# Patient Record
Sex: Female | Born: 1990 | Race: Black or African American | Hispanic: No | Marital: Single | Smoking: Never smoker
Health system: Southern US, Community
[De-identification: ages and names within clinical notes are randomized; demographics above are authoritative.]

## PROBLEM LIST (undated history)

## (undated) ENCOUNTER — Inpatient Hospital Stay (HOSPITAL_COMMUNITY): Payer: Self-pay

## (undated) ENCOUNTER — Ambulatory Visit: Admission: EM | Source: Home / Self Care

## (undated) ENCOUNTER — Ambulatory Visit

## (undated) DIAGNOSIS — R87629 Unspecified abnormal cytological findings in specimens from vagina: Secondary | ICD-10-CM

## (undated) DIAGNOSIS — O139 Gestational [pregnancy-induced] hypertension without significant proteinuria, unspecified trimester: Secondary | ICD-10-CM

## (undated) DIAGNOSIS — Z789 Other specified health status: Secondary | ICD-10-CM

## (undated) DIAGNOSIS — Z8619 Personal history of other infectious and parasitic diseases: Secondary | ICD-10-CM

## (undated) HISTORY — DX: Gestational (pregnancy-induced) hypertension without significant proteinuria, unspecified trimester: O13.9

## (undated) HISTORY — PX: WISDOM TOOTH EXTRACTION: SHX21

## (undated) HISTORY — PX: BARTHOLIN GLAND CYST EXCISION: SHX565

## (undated) HISTORY — DX: Unspecified abnormal cytological findings in specimens from vagina: R87.629

## (undated) HISTORY — PX: NO PAST SURGERIES: SHX2092

---

## 2012-03-05 ENCOUNTER — Emergency Department (INDEPENDENT_AMBULATORY_CARE_PROVIDER_SITE_OTHER)
Admission: EM | Admit: 2012-03-05 | Discharge: 2012-03-05 | Disposition: A | Discharge status: Home / Self Care | Payer: Self-pay | Source: Home / Self Care | Attending: Family Medicine | Admitting: Family Medicine

## 2012-03-05 ENCOUNTER — Encounter (HOSPITAL_COMMUNITY): Payer: Self-pay | Admitting: *Deleted

## 2012-03-05 DIAGNOSIS — L259 Unspecified contact dermatitis, unspecified cause: Secondary | ICD-10-CM

## 2012-03-05 HISTORY — DX: Personal history of other infectious and parasitic diseases: Z86.19

## 2012-03-05 MED ORDER — MOMETASONE FUROATE 0.1 % EX CREA: TOPICAL_CREAM | Freq: Every day | CUTANEOUS | Status: DC

## 2012-03-05 NOTE — ED Provider Notes (Signed)
History     CSN: [id number]  Arrival date & time 03/05/12  1330   First MD Initiated Contact with Patient 03/05/12 1353      Chief Complaint  Patient presents with  . Rash  . Eye Problem    (Consider location/radiation/quality/duration/timing/severity/associated sxs/prior treatment) Patient is a 21 y.o. female presenting with rash and eye problem. The history is provided by the patient.  Rash  This is a new problem. The current episode started more than 2 days ago. The problem has been gradually worsening. The problem is associated with an unknown (pollen exposure outdoors with work) factor. There has been no fever. The rash is present on the face and neck. The patient is experiencing no pain. Associated symptoms include itching.  Eye Problem  Associated symptoms include itching.    Past Medical History  Diagnosis Date  . History of cold sores     History reviewed. No pertinent past surgical history.  History reviewed. No pertinent family history.  History  Substance Use Topics  . Smoking status: Never Smoker   . Smokeless tobacco: Not on file  . Alcohol Use: No    OB History    Grav Para Term Preterm Abortions TAB SAB Ect Mult Living                  Review of Systems  Constitutional: Negative.   Skin: Positive for itching and rash.    Allergies  Review of patient's allergies indicates no known allergies.  Home Medications   Current Outpatient Rx  Name Route Sig Dispense Refill  . CALAMINE EX LOTN Topical Apply topically as needed.    Marland Kitchen DIPHENHYDRAMINE HCL (SLEEP) 25 MG PO TABS Oral Take 50 mg by mouth at bedtime as needed.    . MOMETASONE FUROATE 0.1 % EX CREA Topical Apply topically daily. 45 g 0    BP 118/73  Pulse 71  Temp(Src) 98.6 F (37 C) (Oral)  Resp 17  SpO2 100%  LMP 01/30/2012  Physical Exam  Nursing note and vitals reviewed. Constitutional: She is oriented to person, place, and time. She appears well-developed and well-nourished.    HENT:  Head: Normocephalic.  Right Ear: External ear normal.  Left Ear: External ear normal.  Mouth/Throat: Oropharynx is clear and moist.  Eyes: Conjunctivae and EOM are normal. Pupils are equal, round, and reactive to light.  Neck: Normal range of motion. Neck supple.  Pulmonary/Chest: Breath sounds normal.  Neurological: She is alert and oriented to person, place, and time.  Skin: Skin is warm and dry. Rash noted.       Dry whitish patchy pruritic rash to bilat lower eyelids and cheeks and ant neck  Psychiatric: She has a normal mood and affect.    ED Course  Procedures (including critical care time)  Labs Reviewed - No data to display No results found.   1. Contact dermatitis and eczema       MDM         Linna Hoff, MD 03/05/12 1540

## 2012-03-05 NOTE — ED Notes (Signed)
Pt with onset of rash under left eye x 2 days - now with swelling and rash bilateral face - iching

## 2012-08-09 ENCOUNTER — Encounter (HOSPITAL_COMMUNITY): Payer: Self-pay | Admitting: Emergency Medicine

## 2012-08-09 ENCOUNTER — Emergency Department (HOSPITAL_COMMUNITY)
Admission: EM | Admit: 2012-08-09 | Discharge: 2012-08-09 | Disposition: A | Discharge status: Home / Self Care | Payer: Medicaid Other | Attending: Emergency Medicine | Admitting: Emergency Medicine

## 2012-08-09 DIAGNOSIS — R51 Headache: Secondary | ICD-10-CM | POA: Insufficient documentation

## 2012-08-09 DIAGNOSIS — N12 Tubulo-interstitial nephritis, not specified as acute or chronic: Secondary | ICD-10-CM | POA: Insufficient documentation

## 2012-08-09 LAB — CBC WITH DIFFERENTIAL/PLATELET
Basophils Absolute: 0 10*3/uL (ref 0.0–0.1)
Basophils Relative: 0 % (ref 0–1)
Hemoglobin: 13.9 g/dL (ref 12.0–15.0)
Lymphocytes Relative: 8 % — ABNORMAL LOW (ref 12–46)
MCHC: 34.7 g/dL (ref 30.0–36.0)
Monocytes Relative: 6 % (ref 3–12)
Neutro Abs: 11.8 10*3/uL — ABNORMAL HIGH (ref 1.7–7.7)
Neutrophils Relative %: 87 % — ABNORMAL HIGH (ref 43–77)
RDW: 12.7 % (ref 11.5–15.5)
WBC: 13.6 10*3/uL — ABNORMAL HIGH (ref 4.0–10.5)

## 2012-08-09 LAB — URINALYSIS, ROUTINE W REFLEX MICROSCOPIC
Ketones, ur: 40 mg/dL — AB
Nitrite: NEGATIVE
Nitrite: NEGATIVE
Specific Gravity, Urine: 1.027 (ref 1.005–1.030)
Urobilinogen, UA: 1 mg/dL (ref 0.0–1.0)
pH: 6.5 (ref 5.0–8.0)

## 2012-08-09 LAB — URINE MICROSCOPIC-ADD ON

## 2012-08-09 LAB — COMPREHENSIVE METABOLIC PANEL
ALT: 12 U/L (ref 0–35)
AST: 17 U/L (ref 0–37)
Albumin: 3.8 g/dL (ref 3.5–5.2)
Alkaline Phosphatase: 63 U/L (ref 39–117)
Chloride: 100 mEq/L (ref 96–112)
Potassium: 3.7 mEq/L (ref 3.5–5.1)
Total Bilirubin: 0.4 mg/dL (ref 0.3–1.2)

## 2012-08-09 LAB — GRAM STAIN

## 2012-08-09 MED ORDER — SODIUM CHLORIDE 0.9 % IV BOLUS (SEPSIS)
1000.0000 mL | Freq: Once | INTRAVENOUS | Status: AC
  Administered 2012-08-09: 1000 mL via INTRAVENOUS

## 2012-08-09 MED ORDER — ONDANSETRON HCL 4 MG/2ML IJ SOLN
4.0000 mg | Freq: Once | INTRAMUSCULAR | Status: AC
  Administered 2012-08-09: 4 mg via INTRAVENOUS
  Filled 2012-08-09: qty 2

## 2012-08-09 MED ORDER — CEFTRIAXONE SODIUM 1 G IJ SOLR
1.0000 g | Freq: Once | INTRAMUSCULAR | Status: AC
  Administered 2012-08-09: 1 g via INTRAVENOUS
  Filled 2012-08-09: qty 10

## 2012-08-09 MED ORDER — IBUPROFEN 800 MG PO TABS
800.0000 mg | ORAL_TABLET | Freq: Once | ORAL | Status: AC
  Administered 2012-08-09: 800 mg via ORAL
  Filled 2012-08-09: qty 1

## 2012-08-09 MED ORDER — CEPHALEXIN 500 MG PO CAPS: 500.0000 mg | ORAL_CAPSULE | Freq: Four times a day (QID) | ORAL | Status: AC

## 2012-08-09 NOTE — ED Notes (Signed)
Pt to ED c/o headache x 2 days.  She originally went to urgent care, but they asked for money upfront and she does not have any, so she came here.  When pt arrived she was told she had a fever.  Pt also c/o lower back pain, but denies urinary symptoms.

## 2012-08-09 NOTE — ED Notes (Signed)
PT states she started having a headache last nite to frontal   to back of head and lower back pain states is throbbing

## 2012-08-09 NOTE — ED Provider Notes (Signed)
History     CSN: [id number]  Arrival date & time 08/09/12  1412   None     Chief Complaint  Patient presents with  . Headache  . Back Pain  . Chills    (Consider location/radiation/quality/duration/timing/severity/associated sxs/prior treatment) Patient is a 21 y.o. female presenting with flank pain. The history is provided by the patient.  Flank Pain This is a new problem. The current episode started yesterday. The problem occurs constantly. The problem has been unchanged. Associated symptoms include chills, diaphoresis, fatigue, a fever, headaches and nausea. Pertinent negatives include no abdominal pain, anorexia, arthralgias, change in bowel habit, chest pain, congestion, coughing, joint swelling, myalgias, neck pain or numbness. Nothing aggravates the symptoms. She has tried nothing for the symptoms. The treatment provided no relief.    Past Medical History  Diagnosis Date  . History of cold sores     History reviewed. No pertinent past surgical history.  No family history on file.  History  Substance Use Topics  . Smoking status: Never Smoker   . Smokeless tobacco: Not on file  . Alcohol Use: No    OB History    Grav Para Term Preterm Abortions TAB SAB Ect Mult Living                  Review of Systems  Constitutional: Positive for fever, chills, diaphoresis and fatigue.  HENT: Negative for congestion and neck pain.   Respiratory: Negative for cough and shortness of breath.   Cardiovascular: Negative for chest pain.  Gastrointestinal: Positive for nausea. Negative for abdominal pain, diarrhea, anorexia and change in bowel habit.  Genitourinary: Positive for flank pain. Negative for urgency, frequency, hematuria, decreased urine volume and difficulty urinating.  Musculoskeletal: Negative for myalgias, joint swelling and arthralgias.  Neurological: Positive for headaches. Negative for numbness.  All other systems reviewed and are negative.    Allergies    Review of patient's allergies indicates no known allergies.  Home Medications   Current Outpatient Rx  Name Route Sig Dispense Refill  . MOMETASONE FUROATE 0.1 % EX CREA Topical Apply 1 application topically daily as needed. For face      BP 105/58  Pulse 103  Temp 98.6 F (37 C) (Oral)  Resp 16  SpO2 98%  Physical Exam  Nursing note and vitals reviewed. Constitutional: She is oriented to person, place, and time. She appears well-developed and well-nourished. No distress.  HENT:  Head: Normocephalic and atraumatic.  Eyes: EOM are normal. Pupils are equal, round, and reactive to light.  Neck: Normal range of motion.  Cardiovascular: Normal rate and normal heart sounds.   Pulmonary/Chest: Effort normal and breath sounds normal. No respiratory distress.  Abdominal: Soft. She exhibits no distension. There is no tenderness. There is CVA tenderness (right side, severe).  Musculoskeletal: Normal range of motion.  Neurological: She is alert and oriented to person, place, and time.  Skin: Skin is warm and dry.    ED Course  Procedures (including critical care time)  Labs Reviewed  URINALYSIS, ROUTINE W REFLEX MICROSCOPIC - Abnormal; Notable for the following:    Color, Urine AMBER (*)  BIOCHEMICALS MAY BE AFFECTED BY COLOR   APPearance CLOUDY (*)     Ketones, ur 40 (*)     Protein, ur 100 (*)     Leukocytes, UA LARGE (*)     All other components within normal limits  CBC WITH DIFFERENTIAL - Abnormal; Notable for the following:    WBC 13.6 (*)  Neutrophils Relative 87 (*)     Neutro Abs 11.8 (*)     Lymphocytes Relative 8 (*)     All other components within normal limits  COMPREHENSIVE METABOLIC PANEL - Abnormal; Notable for the following:    Sodium 133 (*)     Glucose, Bld 101 (*)     All other components within normal limits  URINE MICROSCOPIC-ADD ON - Abnormal; Notable for the following:    Squamous Epithelial / LPF MANY (*)     Bacteria, UA MANY (*)     All other  components within normal limits  POCT PREGNANCY, URINE   No results found.   1. Pyelonephritis       MDM  8:30 PM Pt seen and examined. Pt presents with symptoms concerning for pyelo (chills, R CVA pain--only location of pain on exam, nausea, fever). Although urine not horrible, feel these symptoms are c/w pyleo and will treat as such. Will hydrate, give motrin for pain, and Rocephin as antibiotic.   10:54 PM Pt feeling improved. Pt advised to return to ED if not tolerating medicine or for continued symptoms.   Daleen Bo, MD 08/10/12 [phone]

## 2012-08-09 NOTE — ED Notes (Signed)
Pt given face mask and updated on care

## 2012-08-09 NOTE — ED Notes (Signed)
Pt denies pain, iv fluids complete and afebrile.  Dr Aubery Lapping notified.

## 2012-08-10 LAB — URINE CULTURE

## 2012-08-10 NOTE — ED Provider Notes (Signed)
I saw and evaluated the patient, reviewed the resident's note and I agree with the findings and plan. Tissue with symptoms most consistent with pilar. She is having one sided back pain with fever and nausea without vomiting. Patient states the headache started after the back pain and has no meningeal signs. No concern for meningitis at this time. UA with signs of infection and patient was treated with Rocephin and Keflex.  Gwyneth Sprout, MD 08/10/12 2324

## 2012-09-21 ENCOUNTER — Inpatient Hospital Stay (HOSPITAL_COMMUNITY)
Admission: AD | Admit: 2012-09-21 | Discharge: 2012-09-21 | Disposition: A | Discharge status: Home / Self Care | Payer: Medicaid Other | Source: Ambulatory Visit | Attending: Obstetrics and Gynecology | Admitting: Obstetrics and Gynecology

## 2012-09-21 ENCOUNTER — Encounter (HOSPITAL_COMMUNITY): Payer: Self-pay | Admitting: *Deleted

## 2012-09-21 DIAGNOSIS — O2390 Unspecified genitourinary tract infection in pregnancy, unspecified trimester: Secondary | ICD-10-CM | POA: Insufficient documentation

## 2012-09-21 DIAGNOSIS — R1032 Left lower quadrant pain: Secondary | ICD-10-CM | POA: Insufficient documentation

## 2012-09-21 DIAGNOSIS — R109 Unspecified abdominal pain: Secondary | ICD-10-CM

## 2012-09-21 DIAGNOSIS — A499 Bacterial infection, unspecified: Secondary | ICD-10-CM | POA: Insufficient documentation

## 2012-09-21 DIAGNOSIS — N760 Acute vaginitis: Secondary | ICD-10-CM | POA: Insufficient documentation

## 2012-09-21 DIAGNOSIS — O26899 Other specified pregnancy related conditions, unspecified trimester: Secondary | ICD-10-CM

## 2012-09-21 DIAGNOSIS — B9689 Other specified bacterial agents as the cause of diseases classified elsewhere: Secondary | ICD-10-CM | POA: Insufficient documentation

## 2012-09-21 LAB — WET PREP, GENITAL
Trich, Wet Prep: NONE SEEN
Yeast Wet Prep HPF POC: NONE SEEN

## 2012-09-21 LAB — POCT PREGNANCY, URINE: Preg Test, Ur: POSITIVE — AB

## 2012-09-21 LAB — URINALYSIS, ROUTINE W REFLEX MICROSCOPIC
Glucose, UA: NEGATIVE mg/dL
Hgb urine dipstick: NEGATIVE
Protein, ur: NEGATIVE mg/dL

## 2012-09-21 MED ORDER — METRONIDAZOLE 500 MG PO TABS: 500.0000 mg | ORAL_TABLET | Freq: Two times a day (BID) | ORAL | Status: DC

## 2012-09-21 NOTE — MAU Provider Note (Signed)
History     CSN: [id number]  Arrival date and time: 09/21/12 1255   None     Chief Complaint  Patient presents with  . Abdominal Pain   HPI 21 y.o. G1P0 at [redacted]w[redacted]d with LLQ pain, intermittent. Pt states, "It's not very bad and I'm not having any pain now. It happened earlier today and only lasted a few seconds. It doesn't happen very often." No bleeding or discharge.   Past Medical History  Diagnosis Date  . History of cold sores     Past Surgical History  Procedure Date  . Wisdom tooth extraction     History reviewed. No pertinent family history.  History  Substance Use Topics  . Smoking status: Never Smoker   . Smokeless tobacco: Never Used  . Alcohol Use: No    Allergies: No Known Allergies  No prescriptions prior to admission    Review of Systems  Constitutional: Negative.   Respiratory: Negative.   Cardiovascular: Negative.   Gastrointestinal: Positive for abdominal pain. Negative for nausea, vomiting, diarrhea and constipation.  Genitourinary: Negative for dysuria, urgency, frequency, hematuria and flank pain.       Negative for vaginal bleeding, vaginal discharge, dyspareunia  Musculoskeletal: Negative.   Neurological: Negative.   Psychiatric/Behavioral: Negative.    Physical Exam   Blood pressure 119/69, pulse 73, temperature 97.8 F (36.6 C), temperature source Oral, resp. rate 18, height 5\' 9"  (1.753 m), weight 155 lb (70.308 kg), last menstrual period 09/01/2012.  Physical Exam  Constitutional: She is oriented to person, place, and time. She appears well-developed and well-nourished. No distress.  HENT:  Head: Normocephalic and atraumatic.  Cardiovascular: Normal rate.   Respiratory: Effort normal. No respiratory distress.  GI: Soft. She exhibits no distension and no mass. There is no tenderness. There is no rebound and no guarding.  Genitourinary: There is no rash or lesion on the right labia. There is no rash or lesion on the left labia.  Uterus is not deviated, not enlarged, not fixed and not tender. Cervix exhibits no motion tenderness, no discharge and no friability. Right adnexum displays no mass, no tenderness and no fullness. Left adnexum displays no mass, no tenderness and no fullness. No erythema, tenderness or bleeding around the vagina. No vaginal discharge found.  Neurological: She is alert and oriented to person, place, and time.  Skin: Skin is warm and dry.  Psychiatric: She has a normal mood and affect.    MAU Course  Procedures  Results for orders placed during the hospital encounter of 09/21/12 (from the past 24 hour(s))  POCT PREGNANCY, URINE     Status: Abnormal   Collection Time   09/21/12  1:30 PM      Component Value Range   Preg Test, Ur POSITIVE (*) NEGATIVE  URINALYSIS, ROUTINE W REFLEX MICROSCOPIC     Status: Normal   Collection Time   09/21/12  1:32 PM      Component Value Range   Color, Urine YELLOW  YELLOW   APPearance CLEAR  CLEAR   Specific Gravity, Urine 1.015  1.005 - 1.030   pH 7.0  5.0 - 8.0   Glucose, UA NEGATIVE  NEGATIVE mg/dL   Hgb urine dipstick NEGATIVE  NEGATIVE   Bilirubin Urine NEGATIVE  NEGATIVE   Ketones, ur NEGATIVE  NEGATIVE mg/dL   Protein, ur NEGATIVE  NEGATIVE mg/dL   Urobilinogen, UA 0.2  0.0 - 1.0 mg/dL   Nitrite NEGATIVE  NEGATIVE   Leukocytes, UA NEGATIVE  NEGATIVE  WET PREP, GENITAL     Status: Abnormal   Collection Time   09/21/12  2:50 PM      Component Value Range   Yeast Wet Prep HPF POC NONE SEEN  NONE SEEN   Trich, Wet Prep NONE SEEN  NONE SEEN   Clue Cells Wet Prep HPF POC MODERATE (*) NONE SEEN   WBC, Wet Prep HPF POC MANY (*) NONE SEEN   Discussed plan of care with patient - considering that the pain is very mild, very infrequent and is no longer occuring and that she had absolutely no pain on exam, we concurred that she could follow up outpatient next week for dating ultrasound. I stressed to patient the importance of immediately follow up if the  pain were to increase or if she develops bleeding, pt states understanding.   Assessment and Plan   1. Abdominal pain in pregnancy   2. BV (bacterial vaginosis)       Medication List     As of 09/21/2012  3:23 PM    START taking these medications         metroNIDAZOLE 500 MG tablet   Commonly known as: FLAGYL   Take 1 tablet (500 mg total) by mouth 2 (two) times daily.          Where to get your medications    These are the prescriptions that you need to pick up. We sent them to a specific pharmacy, so you will need to go there to get them.   [facility] - [place], [place] - [address]    [address] [place] [address]    Phone: [phone]        metroNIDAZOLE 500 MG tablet            Follow-up Information    Follow up with [facility] [facility]. On 09/30/2012. (for ultrasound)    Contact information:   [address] [id number] mc [place] [address] [phone]           FRAZIER,NATALIE 09/21/2012, 3:23 PM

## 2012-09-21 NOTE — MAU Note (Signed)
Pt took home upt on 09/15/2012, which was positive. Intermittent abdominal pain, none present now. LMP-09/01/2012. Denies abnormal vaginal discharge.

## 2012-09-22 NOTE — MAU Provider Note (Signed)
Attestation of Attending Supervision of Advanced Practitioner (CNM/NP): Evaluation and management procedures were performed by the Advanced Practitioner under my supervision and collaboration.  I have reviewed the Advanced Practitioner's note and chart, and I agree with the management and plan.  Jonetta Dagley 09/22/2012 2:19 PM

## 2012-09-30 ENCOUNTER — Ambulatory Visit (HOSPITAL_COMMUNITY)
Admit: 2012-09-30 | Discharge: 2012-09-30 | Disposition: A | Discharge status: Home / Self Care | Payer: Medicaid Other | Attending: Obstetrics & Gynecology | Admitting: Obstetrics & Gynecology

## 2012-09-30 ENCOUNTER — Inpatient Hospital Stay (HOSPITAL_COMMUNITY)
Admission: AD | Admit: 2012-09-30 | Discharge: 2012-09-30 | Disposition: A | Discharge status: Home / Self Care | Payer: Medicaid Other | Source: Ambulatory Visit | Attending: Obstetrics & Gynecology | Admitting: Obstetrics & Gynecology

## 2012-09-30 DIAGNOSIS — O99891 Other specified diseases and conditions complicating pregnancy: Secondary | ICD-10-CM | POA: Insufficient documentation

## 2012-09-30 DIAGNOSIS — R109 Unspecified abdominal pain: Secondary | ICD-10-CM | POA: Insufficient documentation

## 2012-09-30 DIAGNOSIS — Z3689 Encounter for other specified antenatal screening: Secondary | ICD-10-CM | POA: Insufficient documentation

## 2012-09-30 DIAGNOSIS — Z3400 Encounter for supervision of normal first pregnancy, unspecified trimester: Secondary | ICD-10-CM

## 2012-09-30 DIAGNOSIS — R1032 Left lower quadrant pain: Secondary | ICD-10-CM | POA: Insufficient documentation

## 2012-09-30 MED ORDER — CONCEPT OB 130-92.4-1 MG PO CAPS: 1.0000 | ORAL_CAPSULE | ORAL | Status: DC

## 2012-09-30 NOTE — MAU Note (Signed)
Patient to MAU from ultrasound for confirmation of viability. Patient denies any pain or bleeding at this time. Does have some cramping on and off but not now.

## 2012-11-09 NOTE — L&D Delivery Note (Signed)
Delivery Note At 4:07 AM a viable female was delivered via Vaginal, Spontaneous Delivery (Presentation: Left Occiput Posterior).  APGAR: 9-9, ; weight: 3210 gms .   Placenta status: Intact, Spontaneous.  Cord: 3 vessels with the following complications: None.  Cord pH: none  Anesthesia: Epidural  Episiotomy: None Lacerations: 2nd degree;Perineal Suture Repair: 2.0 chromic Est. Blood Loss (mL): 350  Mom to postpartum.  Baby to nursery-stable.  HARPER,CHARLES A 05/31/2013, 4:50 AM

## 2012-12-14 LAB — OB RESULTS CONSOLE ANTIBODY SCREEN: Antibody Screen: NEGATIVE

## 2012-12-14 LAB — OB RESULTS CONSOLE PLATELET COUNT: Platelets: 276 10*3/uL

## 2012-12-14 LAB — OB RESULTS CONSOLE ABO/RH

## 2012-12-14 LAB — OB RESULTS CONSOLE VARICELLA ZOSTER ANTIBODY, IGG: Varicella: IMMUNE

## 2012-12-14 LAB — OB RESULTS CONSOLE HEPATITIS B SURFACE ANTIGEN: Hepatitis B Surface Ag: NEGATIVE

## 2012-12-14 LAB — OB RESULTS CONSOLE GC/CHLAMYDIA: Chlamydia: NEGATIVE

## 2013-02-08 ENCOUNTER — Encounter: Payer: Self-pay | Admitting: *Deleted

## 2013-02-08 ENCOUNTER — Other Ambulatory Visit: Payer: Self-pay

## 2013-02-08 ENCOUNTER — Ambulatory Visit (INDEPENDENT_AMBULATORY_CARE_PROVIDER_SITE_OTHER): Payer: Medicaid Other | Admitting: Obstetrics & Gynecology

## 2013-02-08 VITALS — BP 108/63 | Temp 97.3°F | Wt 176.0 lb

## 2013-02-08 DIAGNOSIS — Z3400 Encounter for supervision of normal first pregnancy, unspecified trimester: Secondary | ICD-10-CM

## 2013-02-08 DIAGNOSIS — K219 Gastro-esophageal reflux disease without esophagitis: Secondary | ICD-10-CM

## 2013-02-08 LAB — POCT URINALYSIS DIPSTICK
Bilirubin, UA: NEGATIVE
Blood, UA: NEGATIVE
Glucose, UA: 250
Spec Grav, UA: 1.02

## 2013-02-08 MED ORDER — OMEPRAZOLE 20 MG PO CPDR: 20.0000 mg | DELAYED_RELEASE_CAPSULE | Freq: Every day | ORAL | Status: DC

## 2013-02-08 NOTE — Progress Notes (Signed)
Heartburn.  Omeprazole Rx.

## 2013-02-08 NOTE — Progress Notes (Signed)
Pulse-81 No complaints.

## 2013-02-14 ENCOUNTER — Encounter: Payer: Self-pay | Admitting: Obstetrics

## 2013-02-14 ENCOUNTER — Ambulatory Visit (INDEPENDENT_AMBULATORY_CARE_PROVIDER_SITE_OTHER): Payer: Medicaid Other | Admitting: Obstetrics

## 2013-02-14 ENCOUNTER — Other Ambulatory Visit: Payer: Medicaid Other | Admitting: *Deleted

## 2013-02-14 VITALS — BP 105/57 | Temp 98.5°F | Wt 176.0 lb

## 2013-02-14 DIAGNOSIS — Z3400 Encounter for supervision of normal first pregnancy, unspecified trimester: Secondary | ICD-10-CM

## 2013-02-14 DIAGNOSIS — Z3482 Encounter for supervision of other normal pregnancy, second trimester: Secondary | ICD-10-CM

## 2013-02-14 LAB — POCT URINALYSIS DIPSTICK
Bilirubin, UA: NEGATIVE
Glucose, UA: NEGATIVE
Ketones, UA: NEGATIVE
Spec Grav, UA: 1.02

## 2013-02-14 NOTE — Progress Notes (Signed)
Pulse-74 No complaints.

## 2013-02-14 NOTE — Progress Notes (Signed)
Doing well 

## 2013-02-15 LAB — CBC
MCH: 30.5 pg (ref 26.0–34.0)
Platelets: 267 10*3/uL (ref 150–400)
RBC: 4.06 MIL/uL (ref 3.87–5.11)
WBC: 10.7 10*3/uL — ABNORMAL HIGH (ref 4.0–10.5)

## 2013-02-15 LAB — GLUCOSE TOLERANCE, 2 HOURS W/ 1HR
Glucose, 1 hour: 69 mg/dL — ABNORMAL LOW (ref 70–170)
Glucose, Fasting: 61 mg/dL — ABNORMAL LOW (ref 70–99)

## 2013-02-15 LAB — HIV ANTIBODY (ROUTINE TESTING W REFLEX): HIV: NONREACTIVE

## 2013-02-16 ENCOUNTER — Other Ambulatory Visit: Payer: Medicaid Other

## 2013-02-16 ENCOUNTER — Encounter: Payer: Medicaid Other | Admitting: Obstetrics

## 2013-02-17 ENCOUNTER — Other Ambulatory Visit: Payer: Self-pay | Admitting: *Deleted

## 2013-02-17 MED ORDER — FLUCONAZOLE 150 MG PO TABS: 150.0000 mg | ORAL_TABLET | Freq: Once | ORAL | Status: DC

## 2013-02-28 ENCOUNTER — Encounter: Payer: Medicaid Other | Admitting: Obstetrics

## 2013-02-28 ENCOUNTER — Ambulatory Visit (INDEPENDENT_AMBULATORY_CARE_PROVIDER_SITE_OTHER): Payer: Medicaid Other | Admitting: Obstetrics

## 2013-02-28 ENCOUNTER — Encounter: Payer: Self-pay | Admitting: Obstetrics

## 2013-02-28 VITALS — BP 119/68 | Temp 96.8°F | Wt 183.0 lb

## 2013-02-28 DIAGNOSIS — Z3400 Encounter for supervision of normal first pregnancy, unspecified trimester: Secondary | ICD-10-CM

## 2013-02-28 LAB — POCT URINALYSIS DIPSTICK
Bilirubin, UA: NEGATIVE
Blood, UA: NEGATIVE
Ketones, UA: NEGATIVE
Nitrite, UA: NEGATIVE
Protein, UA: NEGATIVE
Spec Grav, UA: 1.02
Urobilinogen, UA: NEGATIVE
pH, UA: 6

## 2013-02-28 NOTE — Progress Notes (Signed)
Pulse-90  No complaints

## 2013-03-07 ENCOUNTER — Encounter: Payer: Self-pay | Admitting: Obstetrics

## 2013-03-14 ENCOUNTER — Encounter: Payer: Medicaid Other | Admitting: Obstetrics

## 2013-05-04 ENCOUNTER — Encounter: Payer: Self-pay | Admitting: Obstetrics

## 2013-05-04 ENCOUNTER — Ambulatory Visit (INDEPENDENT_AMBULATORY_CARE_PROVIDER_SITE_OTHER): Payer: Medicaid Other | Admitting: Obstetrics

## 2013-05-04 VITALS — BP 110/72 | Temp 98.1°F | Wt 202.0 lb

## 2013-05-04 DIAGNOSIS — Z3400 Encounter for supervision of normal first pregnancy, unspecified trimester: Secondary | ICD-10-CM

## 2013-05-04 DIAGNOSIS — Z3403 Encounter for supervision of normal first pregnancy, third trimester: Secondary | ICD-10-CM

## 2013-05-04 LAB — POCT URINALYSIS DIPSTICK
Ketones, UA: NEGATIVE
pH, UA: 8

## 2013-05-04 NOTE — Progress Notes (Signed)
Pulse 80. Patient states she has no concerns today

## 2013-05-11 ENCOUNTER — Encounter: Payer: Medicaid Other | Admitting: Obstetrics

## 2013-05-19 ENCOUNTER — Ambulatory Visit (INDEPENDENT_AMBULATORY_CARE_PROVIDER_SITE_OTHER): Payer: Medicaid Other | Admitting: Obstetrics

## 2013-05-19 ENCOUNTER — Encounter: Payer: Self-pay | Admitting: Obstetrics

## 2013-05-19 VITALS — BP 125/77 | Wt 208.2 lb

## 2013-05-19 DIAGNOSIS — Z3400 Encounter for supervision of normal first pregnancy, unspecified trimester: Secondary | ICD-10-CM

## 2013-05-19 DIAGNOSIS — Z3403 Encounter for supervision of normal first pregnancy, third trimester: Secondary | ICD-10-CM

## 2013-05-19 LAB — POCT URINALYSIS DIPSTICK
Bilirubin, UA: NEGATIVE
Blood, UA: NEGATIVE
Ketones, UA: NEGATIVE
Spec Grav, UA: 1.025
Urobilinogen, UA: NEGATIVE

## 2013-05-19 NOTE — Progress Notes (Signed)
Pulse-85 No complaints.

## 2013-05-23 ENCOUNTER — Telehealth: Payer: Self-pay | Admitting: *Deleted

## 2013-05-23 NOTE — Telephone Encounter (Signed)
Pt left message for antibiotic.  Called and spoke with pt, requesting antibiotic "so baby does not get pneumonia". I advised pt treatment for positive GBS is done at the hospital during labor. Pt expressed understanding. No other questions at this time.

## 2013-05-25 ENCOUNTER — Ambulatory Visit (INDEPENDENT_AMBULATORY_CARE_PROVIDER_SITE_OTHER): Payer: Medicaid Other | Admitting: Obstetrics

## 2013-05-25 VITALS — Temp 97.9°F | Wt 212.0 lb

## 2013-05-25 DIAGNOSIS — Z3400 Encounter for supervision of normal first pregnancy, unspecified trimester: Secondary | ICD-10-CM

## 2013-05-25 DIAGNOSIS — Z3403 Encounter for supervision of normal first pregnancy, third trimester: Secondary | ICD-10-CM

## 2013-05-25 LAB — POCT URINALYSIS DIPSTICK
Blood, UA: NEGATIVE
Nitrite, UA: NEGATIVE
Urobilinogen, UA: NEGATIVE
pH, UA: 6

## 2013-05-25 NOTE — Progress Notes (Signed)
P-87 Pt states she is having pressure.

## 2013-05-26 ENCOUNTER — Encounter: Payer: Self-pay | Admitting: Obstetrics

## 2013-05-30 ENCOUNTER — Observation Stay (HOSPITAL_COMMUNITY)
Admission: AD | Admit: 2013-05-30 | Discharge: 2013-05-30 | Disposition: A | Discharge status: Home / Self Care | Payer: Medicaid Other | Source: Ambulatory Visit | Attending: Obstetrics | Admitting: Obstetrics

## 2013-05-30 ENCOUNTER — Encounter (HOSPITAL_COMMUNITY): Payer: Self-pay

## 2013-05-30 ENCOUNTER — Encounter (HOSPITAL_COMMUNITY): Payer: Self-pay | Admitting: *Deleted

## 2013-05-30 ENCOUNTER — Inpatient Hospital Stay (HOSPITAL_COMMUNITY)
Admission: AD | Admit: 2013-05-30 | Discharge: 2013-06-02 | DRG: 775 | Disposition: A | Discharge status: Home / Self Care | Payer: Medicaid Other | Source: Ambulatory Visit | Attending: Obstetrics | Admitting: Obstetrics

## 2013-05-30 DIAGNOSIS — Z22330 Carrier of Group B streptococcus: Secondary | ICD-10-CM

## 2013-05-30 DIAGNOSIS — O99892 Other specified diseases and conditions complicating childbirth: Secondary | ICD-10-CM | POA: Diagnosis present

## 2013-05-30 DIAGNOSIS — R109 Unspecified abdominal pain: Secondary | ICD-10-CM | POA: Insufficient documentation

## 2013-05-30 DIAGNOSIS — O479 False labor, unspecified: Principal | ICD-10-CM | POA: Insufficient documentation

## 2013-05-30 HISTORY — DX: Other specified health status: Z78.9

## 2013-05-30 LAB — URINALYSIS, ROUTINE W REFLEX MICROSCOPIC
Bilirubin Urine: NEGATIVE
Hgb urine dipstick: NEGATIVE
Ketones, ur: NEGATIVE mg/dL
Protein, ur: NEGATIVE mg/dL
Urobilinogen, UA: 0.2 mg/dL (ref 0.0–1.0)

## 2013-05-30 LAB — URINE MICROSCOPIC-ADD ON

## 2013-05-30 MED ORDER — PHENYLEPHRINE 40 MCG/ML (10ML) SYRINGE FOR IV PUSH (FOR BLOOD PRESSURE SUPPORT)
80.0000 ug | PREFILLED_SYRINGE | INTRAVENOUS | Status: DC | PRN
  Filled 2013-05-30: qty 2
  Filled 2013-05-30: qty 5

## 2013-05-30 MED ORDER — LACTATED RINGERS IV SOLN: 500.0000 mL | INTRAVENOUS | Status: DC | PRN

## 2013-05-30 MED ORDER — EPHEDRINE 5 MG/ML INJ
10.0000 mg | INTRAVENOUS | Status: DC | PRN
  Filled 2013-05-30: qty 2
  Filled 2013-05-30: qty 4

## 2013-05-30 MED ORDER — OXYCODONE-ACETAMINOPHEN 5-325 MG PO TABS
2.0000 | ORAL_TABLET | Freq: Once | ORAL | Status: AC
  Administered 2013-05-30: 2 via ORAL
  Filled 2013-05-30: qty 2

## 2013-05-30 MED ORDER — LIDOCAINE HCL (PF) 1 % IJ SOLN
30.0000 mL | INTRAMUSCULAR | Status: AC | PRN
  Administered 2013-05-31: 30 mL via SUBCUTANEOUS
  Filled 2013-05-30 (×2): qty 30

## 2013-05-30 MED ORDER — LACTATED RINGERS IV SOLN: 500.0000 mL | Freq: Once | INTRAVENOUS | Status: DC

## 2013-05-30 MED ORDER — SODIUM CHLORIDE 0.9 % IV SOLN
2.0000 g | Freq: Once | INTRAVENOUS | Status: AC
  Administered 2013-05-31: 2 g via INTRAVENOUS
  Filled 2013-05-30: qty 2000

## 2013-05-30 MED ORDER — FENTANYL 2.5 MCG/ML BUPIVACAINE 1/10 % EPIDURAL INFUSION (WH - ANES)
14.0000 mL/h | INTRAMUSCULAR | Status: DC | PRN
  Administered 2013-05-31: 14 mL/h via EPIDURAL
  Filled 2013-05-30: qty 125

## 2013-05-30 MED ORDER — DIPHENHYDRAMINE HCL 50 MG/ML IJ SOLN: 12.5000 mg | INTRAMUSCULAR | Status: DC | PRN

## 2013-05-30 MED ORDER — ACETAMINOPHEN 325 MG PO TABS: 650.0000 mg | ORAL_TABLET | ORAL | Status: DC | PRN

## 2013-05-30 MED ORDER — PHENYLEPHRINE 40 MCG/ML (10ML) SYRINGE FOR IV PUSH (FOR BLOOD PRESSURE SUPPORT)
80.0000 ug | PREFILLED_SYRINGE | INTRAVENOUS | Status: DC | PRN
  Filled 2013-05-30: qty 2

## 2013-05-30 MED ORDER — CITRIC ACID-SODIUM CITRATE 334-500 MG/5ML PO SOLN: 30.0000 mL | ORAL | Status: DC | PRN

## 2013-05-30 MED ORDER — OXYTOCIN 40 UNITS IN LACTATED RINGERS INFUSION - SIMPLE MED
62.5000 mL/h | INTRAVENOUS | Status: DC
  Filled 2013-05-30: qty 1000

## 2013-05-30 MED ORDER — OXYTOCIN BOLUS FROM INFUSION
500.0000 mL | INTRAVENOUS | Status: DC
  Administered 2013-05-31: 500 mL via INTRAVENOUS

## 2013-05-30 MED ORDER — IBUPROFEN 600 MG PO TABS: 600.0000 mg | ORAL_TABLET | Freq: Four times a day (QID) | ORAL | Status: DC | PRN

## 2013-05-30 MED ORDER — OXYCODONE-ACETAMINOPHEN 5-325 MG PO TABS: 1.0000 | ORAL_TABLET | ORAL | Status: DC | PRN

## 2013-05-30 MED ORDER — EPHEDRINE 5 MG/ML INJ
10.0000 mg | INTRAVENOUS | Status: DC | PRN
  Filled 2013-05-30: qty 2

## 2013-05-30 MED ORDER — ONDANSETRON HCL 4 MG/2ML IJ SOLN: 4.0000 mg | Freq: Four times a day (QID) | INTRAMUSCULAR | Status: DC | PRN

## 2013-05-30 MED ORDER — FLEET ENEMA 7-19 GM/118ML RE ENEM: 1.0000 | ENEMA | RECTAL | Status: DC | PRN

## 2013-05-30 MED ORDER — LACTATED RINGERS IV SOLN: INTRAVENOUS | Status: DC

## 2013-05-30 NOTE — MAU Note (Signed)
C/o cramping since 0800 this AM; no vaginal leaking or bleeding;

## 2013-05-31 ENCOUNTER — Inpatient Hospital Stay (HOSPITAL_COMMUNITY): Payer: Medicaid Other | Admitting: Anesthesiology

## 2013-05-31 ENCOUNTER — Encounter (HOSPITAL_COMMUNITY): Payer: Self-pay | Admitting: Anesthesiology

## 2013-05-31 ENCOUNTER — Encounter (HOSPITAL_COMMUNITY): Payer: Self-pay | Admitting: *Deleted

## 2013-05-31 LAB — CBC
Platelets: 249 10*3/uL (ref 150–400)
RBC: 4.5 MIL/uL (ref 3.87–5.11)
WBC: 16.1 10*3/uL — ABNORMAL HIGH (ref 4.0–10.5)

## 2013-05-31 LAB — URINE CULTURE

## 2013-05-31 LAB — RPR: RPR Ser Ql: NONREACTIVE

## 2013-05-31 MED ORDER — SIMETHICONE 80 MG PO CHEW: 80.0000 mg | CHEWABLE_TABLET | ORAL | Status: DC | PRN

## 2013-05-31 MED ORDER — WITCH HAZEL-GLYCERIN EX PADS: 1.0000 "application " | MEDICATED_PAD | CUTANEOUS | Status: DC | PRN

## 2013-05-31 MED ORDER — SENNOSIDES-DOCUSATE SODIUM 8.6-50 MG PO TABS
2.0000 | ORAL_TABLET | Freq: Every day | ORAL | Status: DC
  Administered 2013-05-31 – 2013-06-01 (×2): 2 via ORAL

## 2013-05-31 MED ORDER — DIPHENHYDRAMINE HCL 25 MG PO CAPS: 25.0000 mg | ORAL_CAPSULE | Freq: Four times a day (QID) | ORAL | Status: DC | PRN

## 2013-05-31 MED ORDER — LANOLIN HYDROUS EX OINT: TOPICAL_OINTMENT | CUTANEOUS | Status: DC | PRN

## 2013-05-31 MED ORDER — DIBUCAINE 1 % RE OINT: 1.0000 "application " | TOPICAL_OINTMENT | RECTAL | Status: DC | PRN

## 2013-05-31 MED ORDER — ZOLPIDEM TARTRATE 5 MG PO TABS: 5.0000 mg | ORAL_TABLET | Freq: Every evening | ORAL | Status: DC | PRN

## 2013-05-31 MED ORDER — IBUPROFEN 600 MG PO TABS
600.0000 mg | ORAL_TABLET | Freq: Four times a day (QID) | ORAL | Status: DC
  Administered 2013-05-31 – 2013-06-02 (×9): 600 mg via ORAL
  Filled 2013-05-31 (×10): qty 1

## 2013-05-31 MED ORDER — OXYTOCIN 40 UNITS IN LACTATED RINGERS INFUSION - SIMPLE MED: 1.0000 m[IU]/min | INTRAVENOUS | Status: DC

## 2013-05-31 MED ORDER — MEDROXYPROGESTERONE ACETATE 150 MG/ML IM SUSP: 150.0000 mg | INTRAMUSCULAR | Status: DC | PRN

## 2013-05-31 MED ORDER — OXYTOCIN 40 UNITS IN LACTATED RINGERS INFUSION - SIMPLE MED: 62.5000 mL/h | INTRAVENOUS | Status: DC | PRN

## 2013-05-31 MED ORDER — BENZOCAINE-MENTHOL 20-0.5 % EX AERO
1.0000 "application " | INHALATION_SPRAY | CUTANEOUS | Status: DC | PRN
  Administered 2013-05-31: 1 via TOPICAL
  Filled 2013-05-31: qty 56

## 2013-05-31 MED ORDER — ONDANSETRON HCL 4 MG PO TABS: 4.0000 mg | ORAL_TABLET | ORAL | Status: DC | PRN

## 2013-05-31 MED ORDER — ONDANSETRON HCL 4 MG/2ML IJ SOLN: 4.0000 mg | INTRAMUSCULAR | Status: DC | PRN

## 2013-05-31 MED ORDER — PRENATAL MULTIVITAMIN CH
1.0000 | ORAL_TABLET | Freq: Every day | ORAL | Status: DC
  Administered 2013-05-31 – 2013-06-01 (×2): 1 via ORAL
  Filled 2013-05-31 (×2): qty 1

## 2013-05-31 MED ORDER — OXYCODONE-ACETAMINOPHEN 5-325 MG PO TABS
1.0000 | ORAL_TABLET | ORAL | Status: DC | PRN
  Administered 2013-05-31 – 2013-06-01 (×4): 1 via ORAL
  Administered 2013-06-02: 2 via ORAL
  Filled 2013-05-31 (×2): qty 1
  Filled 2013-05-31: qty 2
  Filled 2013-05-31 (×2): qty 1

## 2013-05-31 MED ORDER — TETANUS-DIPHTH-ACELL PERTUSSIS 5-2.5-18.5 LF-MCG/0.5 IM SUSP
0.5000 mL | Freq: Once | INTRAMUSCULAR | Status: AC
  Administered 2013-06-01: 0.5 mL via INTRAMUSCULAR

## 2013-05-31 MED ORDER — LIDOCAINE HCL (PF) 1 % IJ SOLN
INTRAMUSCULAR | Status: DC | PRN
  Administered 2013-05-31 (×4): 4 mL

## 2013-05-31 NOTE — H&P (Signed)
Carrie Nielsen is a 22 y.o. female presenting for UC's. Maternal Medical History:  Reason for admission: Contractions.  22 yo G1.  EDC 05-26-13.  Presents with UC's.  Fetal activity: Perceived fetal activity is normal.   Last perceived fetal movement was within the past hour.    Prenatal complications: no prenatal complications Prenatal Complications - Diabetes: none.    OB History   Grav Para Term Preterm Abortions TAB SAB Ect Mult Living   1         0     Past Medical History  Diagnosis Date  . History of cold sores   . Medical history non-contributory    Past Surgical History  Procedure Laterality Date  . Wisdom tooth extraction     Family History: family history is negative for Alcohol abuse, and Arthritis, and Asthma, and Birth defects, and Cancer, and COPD, and Depression, and Diabetes, and Early death, and Drug abuse, and Hearing loss, and Heart disease, and Hyperlipidemia, and Hypertension, and Kidney disease, and Learning disabilities, and Mental illness, and Mental retardation, and Miscarriages / Stillbirths, and Stroke, and Vision loss, . Social History:  reports that she has never smoked. She has never used smokeless tobacco. She reports that she does not drink alcohol or use illicit drugs.   Prenatal Transfer Tool  Maternal Diabetes: No Genetic Screening: Normal Maternal Ultrasounds/Referrals: Normal Fetal Ultrasounds or other Referrals:  None Maternal Substance Abuse:  No Significant Maternal Medications:  Meds include: Other:  Valtrex Significant Maternal Lab Results:  None Other Comments:  None  Review of Systems  All other systems reviewed and are negative.    Dilation: 8.5 Effacement (%): 100 Station: 0 Exam by:: Courtney Paris, RN Blood pressure 130/73, pulse 94, temperature 98 F (36.7 C), temperature source Oral, resp. rate 22, height 5\' 9"  (1.753 m), weight 212 lb (96.163 kg), last menstrual period 09/01/2012. Maternal Exam:  Uterine  Assessment: Contraction strength is moderate.  Abdomen: Patient reports no abdominal tenderness. Fetal presentation: vertex     Physical Exam  Nursing note and vitals reviewed. Constitutional: She is oriented to person, place, and time. She appears well-developed and well-nourished.  HENT:  Head: Normocephalic and atraumatic.  Eyes: Conjunctivae are normal. Pupils are equal, round, and reactive to light.  Neck: Normal range of motion. Neck supple.  Cardiovascular: Normal rate and regular rhythm.   Respiratory: Effort normal and breath sounds normal.  GI: Soft.  Genitourinary: Vagina normal and uterus normal.  Musculoskeletal: Normal range of motion.  Neurological: She is alert and oriented to person, place, and time.  Skin: Skin is warm and dry.  Psychiatric: She has a normal mood and affect. Her behavior is normal. Judgment and thought content normal.    Prenatal labs: ABO, Rh: O/Positive/-- (02/05 0000) Antibody: Negative (02/05 0000) Rubella: Immune (02/05 0000) RPR: NON REAC (04/08 1352)  HBsAg: Negative (02/05 0000)  HIV: NON REACTIVE (04/08 1352)  GBS: POSITIVE (06/26 1112)   Assessment/Plan: 40.5 weeks.  Active labor.  Admit.  Expectant management.   HARPER,CHARLES A 05/31/2013, 12:43 AM

## 2013-05-31 NOTE — Anesthesia Preprocedure Evaluation (Signed)
Anesthesia Evaluation  Patient identified by MRN, date of birth, ID band Patient awake    Reviewed: Allergy & Precautions, H&P , NPO status , Patient's Chart, lab work & pertinent test results, reviewed documented beta blocker date and time   History of Anesthesia Complications Negative for: history of anesthetic complications  Airway Mallampati: I TM Distance: >3 FB Neck ROM: full    Dental  (+) Teeth Intact   Pulmonary neg pulmonary ROS,  breath sounds clear to auscultation        Cardiovascular negative cardio ROS  Rhythm:regular Rate:Normal     Neuro/Psych negative neurological ROS  negative psych ROS   GI/Hepatic Neg liver ROS, GERD-  Medicated,  Endo/Other  negative endocrine ROS  Renal/GU negative Renal ROS     Musculoskeletal   Abdominal   Peds  Hematology negative hematology ROS (+)   Anesthesia Other Findings   Reproductive/Obstetrics (+) Pregnancy                           Anesthesia Physical Anesthesia Plan  ASA: II  Anesthesia Plan: Epidural   Post-op Pain Management:    Induction:   Airway Management Planned:   Additional Equipment:   Intra-op Plan:   Post-operative Plan:   Informed Consent: I have reviewed the patients History and Physical, chart, labs and discussed the procedure including the risks, benefits and alternatives for the proposed anesthesia with the patient or authorized representative who has indicated his/her understanding and acceptance.     Plan Discussed with:   Anesthesia Plan Comments:         Anesthesia Quick Evaluation  

## 2013-05-31 NOTE — Anesthesia Postprocedure Evaluation (Signed)
Anesthesia Post Note  Patient: Carrie Nielsen  Procedure(s) Performed: * No procedures listed *  Anesthesia type: Epidural  Patient location: Mother/Baby  Post pain: Pain level controlled  Post assessment: Post-op Vital signs reviewed  Last Vitals:  Filed Vitals:   05/31/13 0740  BP: 121/73  Pulse: 109  Temp: 36.8 C  Resp: 18    Post vital signs: Reviewed  Level of consciousness:alert  Complications: No apparent anesthesia complications

## 2013-05-31 NOTE — Progress Notes (Signed)
UR chart review completed.  

## 2013-05-31 NOTE — Anesthesia Procedure Notes (Signed)
Epidural Patient location during procedure: OB Start time: 05/31/2013 12:47 AM  Staffing Performed by: anesthesiologist   Preanesthetic Checklist Completed: patient identified, site marked, surgical consent, pre-op evaluation, timeout performed, IV checked, risks and benefits discussed and monitors and equipment checked  Epidural Patient position: sitting Prep: site prepped and draped and DuraPrep Patient monitoring: continuous pulse ox and blood pressure Approach: midline Injection technique: LOR air  Needle:  Needle type: Tuohy  Needle gauge: 17 G Needle length: 9 cm and 9 Needle insertion depth: 6 cm Catheter type: closed end flexible Catheter size: 19 Gauge Catheter at skin depth: 11 cm Test dose: negative  Assessment Events: blood not aspirated, injection not painful, no injection resistance, negative IV test and no paresthesia  Additional Notes Discussed risk of headache, infection, bleeding, nerve injury and failed or incomplete block.  Patient voices understanding and wishes to proceed.  Epidural placed easily on first attempt.  No paresthesia.  Patient tolerated procedure well with no apparent complications.  Jasmine December, MD Reason for block:procedure for pain

## 2013-05-31 NOTE — Progress Notes (Signed)
Carrie Nielsen is a 22 y.o. G1P0 at [redacted]w[redacted]d by LMP admitted for active labor  Subjective:   Objective: BP 130/66  Pulse 99  Temp(Src) 98 F (36.7 C) (Oral)  Resp 20  Ht 5\' 9"  (1.753 m)  Wt 212 lb (96.163 kg)  BMI 31.29 kg/m2  SpO2 98%  LMP 09/01/2012      FHT:  FHR: 150 bpm, variability: moderate,  accelerations:  Present,  decelerations:  Absent UC:   regular, every 2-3 minutes SVE:   Dilation: 10 Effacement (%): 100 Station: 0 Exam by:: Beatriz Stallion, RN  Labs: Lab Results  Component Value Date   WBC 16.1* 05/31/2013   HGB 13.9 05/31/2013   HCT 39.5 05/31/2013   MCV 87.8 05/31/2013   PLT 249 05/31/2013    Assessment / Plan: Spontaneous labor, progressing normally  Labor: Progressing normally Preeclampsia:  n/a Fetal Wellbeing:  Category I Pain Control:  Epidural I/D:  n/a Anticipated MOD:  NSVD  Hamid Brookens A 05/31/2013, 2:32 AM

## 2013-05-31 NOTE — Progress Notes (Signed)
Post Partum Day 0 Subjective: no complaints  Objective: Blood pressure 121/73, pulse 109, temperature 98.3 F (36.8 C), temperature source Oral, resp. rate 18, height 5\' 9"  (1.753 m), weight 212 lb (96.163 kg), last menstrual period 09/01/2012, SpO2 98.00%, unknown if currently breastfeeding.  Physical Exam:  General: alert and no distress Lochia: appropriate Uterine Fundus: firm Incision: healing well DVT Evaluation: No evidence of DVT seen on physical exam.   Recent Labs  05/31/13  HGB 13.9  HCT 39.5    Assessment/Plan: Doing well.  Routine.   LOS: 1 day   HARPER,CHARLES A 05/31/2013, 9:00 AM

## 2013-06-01 ENCOUNTER — Encounter: Payer: Medicaid Other | Admitting: Obstetrics

## 2013-06-01 LAB — CBC
Hemoglobin: 12.2 g/dL (ref 12.0–15.0)
MCH: 30.8 pg (ref 26.0–34.0)
MCV: 89.9 fL (ref 78.0–100.0)
Platelets: 223 10*3/uL (ref 150–400)
RBC: 3.96 MIL/uL (ref 3.87–5.11)
WBC: 16.4 10*3/uL — ABNORMAL HIGH (ref 4.0–10.5)

## 2013-06-01 NOTE — Progress Notes (Signed)
Post Partum Day 1 Subjective: no complaints, up ad lib, voiding, tolerating PO and + flatus  Objective: Blood pressure 153/84, pulse 110, temperature 97.9 F (36.6 C), temperature source Oral, resp. rate 18, height 5\' 9"  (1.753 m), weight 212 lb (96.163 kg), last menstrual period 09/01/2012, SpO2 99.00%, unknown if currently breastfeeding.  Physical Exam:  General: alert and no distress Lochia: appropriate Uterine Fundus: firm Incision: healing well DVT Evaluation: No evidence of DVT seen on physical exam.   Recent Labs  05/31/13  HGB 13.9  HCT 39.5    Assessment/Plan: Plan for discharge tomorrow   LOS: 2 days   HARPER,CHARLES A 06/01/2013, 5:33 AM

## 2013-06-02 MED ORDER — OXYCODONE-ACETAMINOPHEN 5-325 MG PO TABS: 1.0000 | ORAL_TABLET | ORAL | Status: DC | PRN

## 2013-06-02 MED ORDER — IBUPROFEN 600 MG PO TABS: 600.0000 mg | ORAL_TABLET | Freq: Four times a day (QID) | ORAL | Status: DC | PRN

## 2013-06-02 NOTE — Discharge Summary (Signed)
Obstetric Discharge Summary Reason for Admission: onset of labor Prenatal Procedures: ultrasound Intrapartum Procedures: spontaneous vaginal delivery Postpartum Procedures: none Complications-Operative and Postpartum: none Hemoglobin  Date Value Range Status  06/01/2013 12.2  12.0 - 15.0 g/dL Final  02/11/4097 11.9   Final     HCT  Date Value Range Status  06/01/2013 35.6* 36.0 - 46.0 % Final  12/14/2012 38   Final    Physical Exam:  General: alert and no distress Lochia: appropriate Uterine Fundus: firm Incision: healing well DVT Evaluation: No evidence of DVT seen on physical exam.  Discharge Diagnoses: Term Pregnancy-delivered  Discharge Information: Date: 06/02/2013 Activity: pelvic rest Diet: routine Medications: PNV, Ibuprofen, Colace and Percocet Condition: stable Instructions: refer to practice specific booklet Discharge to: home Follow-up Information   Follow up with HARPER,CHARLES A, MD. Schedule an appointment as soon as possible for a visit in 2 weeks.   Contact information:   [address] [place] [address] [phone]       Newborn Data: Live born female  Birth Weight: 7 lb 1.2 oz (3210 g) APGAR: 9, 9  Home with mother.  HARPER,CHARLES A 06/02/2013, 7:48 AM

## 2013-06-02 NOTE — Progress Notes (Signed)
Post Partum Day 2 Subjective: no complaints  Objective: Blood pressure 113/62, pulse 80, temperature 98.2 F (36.8 C), temperature source Oral, resp. rate 20, height 5\' 9"  (1.753 m), weight 212 lb (96.163 kg), last menstrual period 09/01/2012, SpO2 99.00%, unknown if currently breastfeeding.  Physical Exam:  General: alert and no distress Lochia: appropriate Uterine Fundus: firm Incision: healing well DVT Evaluation: No evidence of DVT seen on physical exam.   Recent Labs  05/31/13 06/01/13 0545  HGB 13.9 12.2  HCT 39.5 35.6*    Assessment/Plan: Discharge home   LOS: 3 days   HARPER,CHARLES A 06/02/2013, 7:42 AM

## 2013-06-19 ENCOUNTER — Ambulatory Visit (INDEPENDENT_AMBULATORY_CARE_PROVIDER_SITE_OTHER): Payer: Medicaid Other | Admitting: Obstetrics

## 2013-06-19 ENCOUNTER — Encounter: Payer: Self-pay | Admitting: Obstetrics

## 2013-06-19 DIAGNOSIS — Z309 Encounter for contraceptive management, unspecified: Secondary | ICD-10-CM

## 2013-06-19 DIAGNOSIS — Z3009 Encounter for other general counseling and advice on contraception: Secondary | ICD-10-CM

## 2013-06-19 DIAGNOSIS — Z3202 Encounter for pregnancy test, result negative: Secondary | ICD-10-CM

## 2013-06-19 LAB — POCT URINE PREGNANCY: Preg Test, Ur: NEGATIVE

## 2013-06-19 MED ORDER — MEDROXYPROGESTERONE ACETATE 150 MG/ML IM SUSP: 150.0000 mg | INTRAMUSCULAR | Status: DC

## 2013-06-19 NOTE — Progress Notes (Signed)
Subjective:     Carrie Nielsen is a 22 y.o. female who presents for a postpartum visit. She is 19 days postpartum following a spontaneous vaginal delivery. I have fully reviewed the prenatal and intrapartum course. The delivery was at 40 gestational weeks. Outcome: spontaneous vaginal delivery. Anesthesia: epidural. Postpartum course has been normal. Baby's course has been normal. Baby is feeding by bottle - [employer] Good Start. Bleeding staining only. Bowel function is normal. Bladder function is normal. Patient is not sexually active. Contraception method is none. Postpartum depression screening: negative.  The following portions of the patient's history were reviewed and updated as appropriate: allergies, current medications, past family history, past medical history, past social history, past surgical history and problem list.  Review of Systems Pertinent items are noted in HPI.   Objective:    BP 113/74  Pulse 75  Temp(Src) 97.4 F (36.3 C) (Oral)  Wt 190 lb (86.183 kg)  BMI 28.05 kg/m2  Breastfeeding? No   Assessment:   Postpartum counseling for contraception.  Plan:    1. Contraception: abstinence 2. Depo Provera Rx 3. Follow up in: 4 weeks or as needed.

## 2013-06-23 ENCOUNTER — Ambulatory Visit: Payer: Medicaid Other

## 2013-06-26 ENCOUNTER — Ambulatory Visit: Payer: Medicaid Other

## 2013-07-17 ENCOUNTER — Encounter: Payer: Self-pay | Admitting: Obstetrics

## 2013-07-17 ENCOUNTER — Ambulatory Visit (INDEPENDENT_AMBULATORY_CARE_PROVIDER_SITE_OTHER): Payer: Medicaid Other | Admitting: Obstetrics

## 2013-07-17 DIAGNOSIS — Z309 Encounter for contraceptive management, unspecified: Secondary | ICD-10-CM

## 2013-07-17 DIAGNOSIS — Z3202 Encounter for pregnancy test, result negative: Secondary | ICD-10-CM

## 2013-07-17 MED ORDER — MEDROXYPROGESTERONE ACETATE 150 MG/ML IM SUSP
150.0000 mg | INTRAMUSCULAR | Status: DC
  Administered 2013-07-17: 150 mg via INTRAMUSCULAR

## 2013-07-17 NOTE — Progress Notes (Addendum)
.   Subjective:     Carrie Nielsen is a 22 y.o. female who presents for a postpartum visit. She is 6 weeks postpartum following a spontaneous vaginal delivery. I have fully reviewed the prenatal and intrapartum course. The delivery was at 40 gestational weeks. Outcome: spontaneous vaginal delivery. Anesthesia: epidural. Postpartum course has been normal. Baby's course has been normal. Baby is feeding by bottle [name]. Bleeding no bleeding. Bowel function is normal. Bladder function is normal. Patient is not sexually active. Contraception method is none. Postpartum depression screening: negative.  The following portions of the patient's history were reviewed and updated as appropriate: allergies, current medications, past family history, past medical history, past social history, past surgical history and problem list.  Review of Systems Pertinent items are noted in HPI.   Objective:    There were no vitals taken for this visit.  General:  alert and no distress  Abdomen:    Soft, NT.   Pelvic: Uterus NSSC, NT                              Assessment:     Normal postpartum exam. Pap smear not done at today's visit.   Plan:    1. Contraception: abstinence 2. Depo Provera injection given. 3. Follow up in: 3 months or as needed.    Patient given depo injection today.  Patient states that she has not had intercourse since her delivery.  Per Dr. Clearance Coots, injection given and patient tolerated well.  Patient instructed to RTO for next injection 10/08/13.

## 2013-07-17 NOTE — Addendum Note (Signed)
Addended by: George Hugh on: 07/17/2013 02:34 PM   Modules accepted: Orders

## 2013-10-09 ENCOUNTER — Ambulatory Visit: Payer: Medicaid Other

## 2013-10-16 ENCOUNTER — Ambulatory Visit: Payer: Medicaid Other

## 2013-10-20 ENCOUNTER — Ambulatory Visit: Payer: Medicaid Other | Admitting: Obstetrics

## 2013-11-15 ENCOUNTER — Ambulatory Visit: Payer: Medicaid Other | Admitting: Obstetrics

## 2013-12-06 ENCOUNTER — Ambulatory Visit (INDEPENDENT_AMBULATORY_CARE_PROVIDER_SITE_OTHER): Payer: Medicaid Other | Admitting: Obstetrics

## 2013-12-06 ENCOUNTER — Encounter: Payer: Self-pay | Admitting: Obstetrics

## 2013-12-06 VITALS — BP 131/70 | HR 81 | Temp 98.1°F | Wt 176.0 lb

## 2013-12-06 DIAGNOSIS — Z3202 Encounter for pregnancy test, result negative: Secondary | ICD-10-CM

## 2013-12-06 DIAGNOSIS — B002 Herpesviral gingivostomatitis and pharyngotonsillitis: Secondary | ICD-10-CM | POA: Insufficient documentation

## 2013-12-06 DIAGNOSIS — IMO0001 Reserved for inherently not codable concepts without codable children: Secondary | ICD-10-CM

## 2013-12-06 DIAGNOSIS — Z3009 Encounter for other general counseling and advice on contraception: Secondary | ICD-10-CM

## 2013-12-06 DIAGNOSIS — Z309 Encounter for contraceptive management, unspecified: Secondary | ICD-10-CM

## 2013-12-06 DIAGNOSIS — B009 Herpesviral infection, unspecified: Secondary | ICD-10-CM | POA: Insufficient documentation

## 2013-12-06 LAB — POCT URINE PREGNANCY: Preg Test, Ur: NEGATIVE

## 2013-12-06 MED ORDER — MEDROXYPROGESTERONE ACETATE 150 MG/ML IM SUSP
150.0000 mg | INTRAMUSCULAR | Status: AC
  Administered 2013-12-07 – 2014-08-13 (×4): 150 mg via INTRAMUSCULAR

## 2013-12-06 MED ORDER — VALACYCLOVIR HCL 1 G PO TABS: ORAL_TABLET | ORAL | Status: DC

## 2013-12-06 MED ORDER — MEDROXYPROGESTERONE ACETATE 150 MG/ML IM SUSP: 150.0000 mg | INTRAMUSCULAR | Status: DC

## 2013-12-06 NOTE — Progress Notes (Signed)
Subjective:     Carrie Nielsen is a 23 y.o. female here for a consult exam.  Current complaints: Pt is in office to discuss birth control options.  Pt states she was previously on depo and would like to resume treatment.  Pt was advised of UPT today and again in 2 weeks following course of abstinence. Pt would like to know if she can get a RX for cold sore treatment.  Pt has no other complaints today.  Personal health questionnaire reviewed: yes.   Gynecologic History Patient's last menstrual period was 11/05/2013. Contraception: abstinence   Obstetric History OB History  Gravida Para Term Preterm AB SAB TAB Ectopic Multiple Living  1 1 1       1     # Outcome Date GA Lbr Len/2nd Weight Sex Delivery Anes PTL Lv  1 TRM 05/31/13 [id number] 17:30 / 02:37 7 lb 1.2 oz (3.21 kg) M SVD EPI  Y       The following portions of the patient's history were reviewed and updated as appropriate: allergies, current medications, past family history, past medical history, past social history, past surgical history and problem list.  Review of Systems Pertinent items are noted in HPI.    Objective:    No exam performed today, Consultation only..    Assessment:    Contraceptive counseling.  Oral Herpes Simplex management.   Plan:    Contraception: Depo-Provera injections. Follow up in: 1 day.   Valtrex Rx.

## 2013-12-07 ENCOUNTER — Ambulatory Visit (INDEPENDENT_AMBULATORY_CARE_PROVIDER_SITE_OTHER): Payer: Medicaid Other | Admitting: *Deleted

## 2013-12-07 VITALS — BP 118/74 | HR 60 | Temp 98.2°F | Wt 178.0 lb

## 2013-12-07 DIAGNOSIS — IMO0001 Reserved for inherently not codable concepts without codable children: Secondary | ICD-10-CM

## 2013-12-07 DIAGNOSIS — Z309 Encounter for contraceptive management, unspecified: Secondary | ICD-10-CM

## 2013-12-07 NOTE — Progress Notes (Signed)
Pt in office today for depo injection.  Pt saw Dr Clearance CootsHarper on 12/06/13 with a Negative UPT.   Injection given in pt right deltoid. Pt tolerated well.

## 2014-02-28 ENCOUNTER — Ambulatory Visit (INDEPENDENT_AMBULATORY_CARE_PROVIDER_SITE_OTHER): Payer: Medicaid Other | Admitting: *Deleted

## 2014-02-28 ENCOUNTER — Ambulatory Visit: Payer: Medicaid Other

## 2014-02-28 VITALS — BP 126/75 | HR 59 | Temp 97.7°F | Wt 175.0 lb

## 2014-02-28 DIAGNOSIS — IMO0001 Reserved for inherently not codable concepts without codable children: Secondary | ICD-10-CM

## 2014-02-28 DIAGNOSIS — Z309 Encounter for contraceptive management, unspecified: Secondary | ICD-10-CM

## 2014-02-28 NOTE — Progress Notes (Signed)
Patient is in the office today for her DEPO Injection. Patient is on time for her Injection. Injection given in Left Deltoid. Patient tolerated well. Patient notified to return to office on May 22, 2014 for next DEPO Injection. Patient advised to call her pharmacy before she went to pick her injection up so they would have it ready for her. Patient advised to make sure she brings her DEPO Injection with her to her next appointment. Patient stated she needed an appointment with Dr. Clearance CootsHarper, Patient advised to let the front know and they would schedule that appointment for her. Patient states she would like a refill on her Prenatal Vitamin.  BP 126/75  Pulse 59  Temp(Src) 97.7 F (36.5 C)  Wt 175 lb (79.379 kg)

## 2014-03-15 ENCOUNTER — Ambulatory Visit: Payer: Medicaid Other | Admitting: Obstetrics

## 2014-03-29 ENCOUNTER — Ambulatory Visit: Payer: Medicaid Other | Admitting: Obstetrics

## 2014-04-24 ENCOUNTER — Ambulatory Visit: Payer: Medicaid Other | Admitting: Obstetrics

## 2014-05-22 ENCOUNTER — Ambulatory Visit (INDEPENDENT_AMBULATORY_CARE_PROVIDER_SITE_OTHER): Payer: Medicaid Other | Admitting: *Deleted

## 2014-05-22 VITALS — BP 117/70 | HR 72 | Temp 98.9°F | Ht 69.0 in | Wt 168.0 lb

## 2014-05-22 DIAGNOSIS — Z309 Encounter for contraceptive management, unspecified: Secondary | ICD-10-CM

## 2014-05-22 DIAGNOSIS — Z3049 Encounter for surveillance of other contraceptives: Secondary | ICD-10-CM

## 2014-05-22 NOTE — Progress Notes (Signed)
Patient in office today for a Depo Injection. Patient is on time for her injection.  Patient tolerated injection well. Patient due back in office August 13, 2014  BP 117/70  Pulse 72  Temp(Src) 98.9 F (37.2 C)  Ht 5\' 9"  (1.753 m)  Wt 168 lb (76.204 kg)  BMI 24.80 kg/m2  Breastfeeding? No  Administrations This Visit   medroxyPROGESTERone (DEPO-PROVERA) injection 150 mg   Administered Action Dose Route Administered By   05/22/2014 Given 150 mg Intramuscular Shelda PalAndrea Lynn Hatton, LPN

## 2014-05-31 ENCOUNTER — Ambulatory Visit: Payer: Self-pay | Admitting: Obstetrics

## 2014-06-11 ENCOUNTER — Encounter (HOSPITAL_COMMUNITY): Payer: Self-pay | Admitting: Emergency Medicine

## 2014-06-11 ENCOUNTER — Emergency Department (HOSPITAL_COMMUNITY)
Admission: EM | Admit: 2014-06-11 | Discharge: 2014-06-11 | Disposition: A | Discharge status: Home / Self Care | Payer: Medicaid Other | Attending: Emergency Medicine | Admitting: Emergency Medicine

## 2014-06-11 DIAGNOSIS — K1370 Unspecified lesions of oral mucosa: Secondary | ICD-10-CM | POA: Insufficient documentation

## 2014-06-11 DIAGNOSIS — L0100 Impetigo, unspecified: Secondary | ICD-10-CM | POA: Diagnosis not present

## 2014-06-11 MED ORDER — ACYCLOVIR 5 % EX OINT: 1.0000 "application " | TOPICAL_OINTMENT | Freq: Four times a day (QID) | CUTANEOUS | Status: DC | PRN

## 2014-06-11 MED ORDER — AMOXICILLIN-POT CLAVULANATE 875-125 MG PO TABS: 1.0000 | ORAL_TABLET | Freq: Two times a day (BID) | ORAL | Status: DC

## 2014-06-11 NOTE — ED Provider Notes (Signed)
CSN: [id number]     Arrival date & time 06/11/14  1656 History   This chart was scribed for non-physician practitioner, Sharilyn Sites, PA-C, working with Doug Sou, MD by Charline Bills, ED Scribe. This patient was seen in room TR04C/TR04C and the patient's care was started at 6:18 PM.   Chief Complaint  Patient presents with  . Mouth Lesions   HPI HPI Comments: Carrie Nielsen is a 23 y.o. female, with a h/o cold sores, who presents to the Emergency Department complaining of multiple cold sores onset 2 days ago. Pt reports associated clear drainage and yellow crusting. She states that her lips were chapped before sores appeared.  Has hx of same.  Pt requests refill of her zovirax ointment.  No difficulty swallowing, eating, drinking.  No fever or chills.  Past Medical History  Diagnosis Date  . History of cold sores   . Medical history non-contributory    Past Surgical History  Procedure Laterality Date  . Wisdom tooth extraction     Family History  Problem Relation Age of Onset  . Alcohol abuse Neg Hx   . Arthritis Neg Hx   . Asthma Neg Hx   . Birth defects Neg Hx   . Cancer Neg Hx   . COPD Neg Hx   . Depression Neg Hx   . Diabetes Neg Hx   . Early death Neg Hx   . Drug abuse Neg Hx   . Hearing loss Neg Hx   . Heart disease Neg Hx   . Hyperlipidemia Neg Hx   . Hypertension Neg Hx   . Kidney disease Neg Hx   . Learning disabilities Neg Hx   . Mental illness Neg Hx   . Mental retardation Neg Hx   . Miscarriages / Stillbirths Neg Hx   . Stroke Neg Hx   . Vision loss Neg Hx    History  Substance Use Topics  . Smoking status: Never Smoker   . Smokeless tobacco: Never Used  . Alcohol Use: No   OB History   Grav Para Term Preterm Abortions TAB SAB Ect Mult Living   1 1 1       1      Review of Systems  HENT: Positive for mouth sores.   All other systems reviewed and are negative.  Allergies  Review of patient's allergies indicates no known allergies.  Home  Medications   Prior to Admission medications   Medication Sig Start Date End Date Taking? Authorizing Provider  valACYclovir (VALTREX) 1000 MG tablet Take 1,000 mg by mouth 2 (two) times daily as needed (for outbreaks).   Yes Historical Provider, MD   Triage Vitals: BP 127/54  Pulse 72  Temp(Src) 98.2 F (36.8 C) (Oral)  Resp 18  Ht 5\' 9"  (1.753 m)  Wt 160 lb (72.576 kg)  BMI 23.62 kg/m2  SpO2 98% Physical Exam  Nursing note and vitals reviewed. Constitutional: She is oriented to person, place, and time. She appears well-developed and well-nourished.  HENT:  Head: Normocephalic and atraumatic.  Mouth/Throat: Oropharynx is clear and moist. No oral lesions.  Mouth: no visible ulcerations or cold sores present Honey colored crusting along upper lip border and philtrum  No lesions inside mouth or on tongue  Eyes: Conjunctivae and EOM are normal. Pupils are equal, round, and reactive to light.  Neck: Normal range of motion.  Cardiovascular: Normal rate, regular rhythm and normal heart sounds.   Pulmonary/Chest: Effort normal and breath sounds normal.  Abdominal:  Soft. Bowel sounds are normal.  Musculoskeletal: Normal range of motion.  Neurological: She is alert and oriented to person, place, and time.  Skin: Skin is warm and dry.  Psychiatric: She has a normal mood and affect.   ED Course  Procedures (including critical care time) DIAGNOSTIC STUDIES: Oxygen Saturation is 98% on RA, normal by my interpretation.    COORDINATION OF CARE: 6:21 PM-Discussed treatment plan which includes antibiotics with pt at bedside and pt agreed to plan.   Labs Review Labs Reviewed - No data to display  Imaging Review No results found.   EKG Interpretation None      MDM   Final diagnoses:  Impetigo   Patient with what appears to be impetigo, likely secondary infection to her cold sores.  No herpetic lesions present on exam today.  She is currently afebrile and nontoxic appearing,  safe for discharge. Will start on augmentin and refill her zovirax.  FU with PCP.  Discussed plan with patient, he/she acknowledged understanding and agreed with plan of care.  Return precautions given for new or worsening symptoms.  I personally performed the services described in this documentation, which was scribed in my presence. The recorded information has been reviewed and is accurate.  Garlon HatchetLisa M Kristin Barcus, PA-C 06/11/14 2047

## 2014-06-11 NOTE — Discharge Instructions (Signed)
Take the prescribed medication as directed. °Follow-up with your primary care physician if problems occur. °Return to the ED for new or worsening symptoms. ° °

## 2014-06-11 NOTE — ED Notes (Signed)
Declined W/C at D/C and was escorted to lobby by RN. 

## 2014-06-11 NOTE — ED Notes (Signed)
Pt here with multiple cold sores; pt sts hx of same

## 2014-06-12 NOTE — ED Provider Notes (Signed)
Medical screening examination/treatment/procedure(s) were performed by non-physician practitioner and as supervising physician I was immediately available for consultation/collaboration.   EKG Interpretation None       Parveen Freehling, MD 06/12/14 0109 

## 2014-06-13 ENCOUNTER — Ambulatory Visit (INDEPENDENT_AMBULATORY_CARE_PROVIDER_SITE_OTHER): Payer: Medicaid Other | Admitting: Obstetrics

## 2014-06-13 ENCOUNTER — Encounter: Payer: Self-pay | Admitting: Obstetrics

## 2014-06-13 ENCOUNTER — Ambulatory Visit: Payer: Medicaid Other | Admitting: Obstetrics

## 2014-06-13 VITALS — BP 103/70 | HR 68 | Temp 97.1°F | Ht 69.0 in | Wt 167.0 lb

## 2014-06-13 DIAGNOSIS — N760 Acute vaginitis: Secondary | ICD-10-CM

## 2014-06-13 NOTE — Addendum Note (Signed)
Addended by: Elby BeckPAUL, Keshonda Monsour F on: 06/13/2014 05:26 PM   Modules accepted: Orders

## 2014-06-13 NOTE — Progress Notes (Signed)
Patient ID: Carrie Nielsen, female   DOB: 1991/06/24, 23 y.o.   MRN: [id number]  Chief Complaint  Patient presents with  . Vaginitis    Yellow Vaginal discharge with odor    HPI Carrie Nielsen is a 23 y.o. female.  Yellow vaginal discharge with odor.  HPI  Past Medical History  Diagnosis Date  . History of cold sores   . Medical history non-contributory     Past Surgical History  Procedure Laterality Date  . Wisdom tooth extraction      Family History  Problem Relation Age of Onset  . Alcohol abuse Neg Hx   . Arthritis Neg Hx   . Asthma Neg Hx   . Birth defects Neg Hx   . Cancer Neg Hx   . COPD Neg Hx   . Depression Neg Hx   . Diabetes Neg Hx   . Early death Neg Hx   . Drug abuse Neg Hx   . Hearing loss Neg Hx   . Heart disease Neg Hx   . Hyperlipidemia Neg Hx   . Hypertension Neg Hx   . Kidney disease Neg Hx   . Learning disabilities Neg Hx   . Mental illness Neg Hx   . Mental retardation Neg Hx   . Miscarriages / Stillbirths Neg Hx   . Stroke Neg Hx   . Vision loss Neg Hx     Social History History  Substance Use Topics  . Smoking status: Never Smoker   . Smokeless tobacco: Never Used  . Alcohol Use: No    No Known Allergies  Current Outpatient Prescriptions  Medication Sig Dispense Refill  . amoxicillin-clavulanate (AUGMENTIN) 875-125 MG per tablet Take 1 tablet by mouth every 12 (twelve) hours.  20 tablet  0  . valACYclovir (VALTREX) 1000 MG tablet Take 1,000 mg by mouth 2 (two) times daily as needed (for outbreaks).      Marland Kitchen acyclovir ointment (ZOVIRAX) 5 % Apply 1 application topically every 6 (six) hours as needed.  15 g  0   Current Facility-Administered Medications  Medication Dose Route Frequency Provider Last Rate Last Dose  . medroxyPROGESTERone (DEPO-PROVERA) injection 150 mg  150 mg Intramuscular Q90 days Brock Bad, MD   150 mg at 05/22/14 1028    Review of Systems Review of Systems Constitutional: negative for fatigue and weight  loss Respiratory: negative for cough and wheezing Cardiovascular: negative for chest pain, fatigue and palpitations Gastrointestinal: negative for abdominal pain and change in bowel habits Genitourinary:  Yellow, malodorous vaginal discharge. Integument/breast: negative for nipple discharge Musculoskeletal:negative for myalgias Neurological: negative for gait problems and tremors Behavioral/Psych: negative for abusive relationship, depression Endocrine: negative for temperature intolerance     Blood pressure 103/70, pulse 68, temperature 97.1 F (36.2 C), height 5\' 9"  (1.753 m), weight 167 lb (75.751 kg), not currently breastfeeding.  Physical Exam Physical Exam General:   alert  Skin:   no rash or abnormalities  Lungs:   clear to auscultation bilaterally  Heart:   regular rate and rhythm, S1, S2 normal, no murmur, click, rub or gallop  Breasts:   normal without suspicious masses, skin or nipple changes or axillary nodes  Abdomen:  normal findings: no organomegaly, soft, non-tender and no hernia  Pelvis:  External genitalia: normal general appearance Urinary system: urethral meatus normal and bladder without fullness, nontender Vaginal: normal without tenderness, induration or masses.                 Juanita Craver,  thin vaginal discharge Cervix: normal appearance Adnexa: not examined Uterus: not examined      Data Reviewed labs  Assessment    Vaginitis.     Plan    Wet prep and cultures done.  Will call with results. F/U prn  No orders of the defined types were placed in this encounter.   No orders of the defined types were placed in this encounter.        HARPER,CHARLES A 06/13/2014, 3:50 PM

## 2014-06-14 LAB — GC/CHLAMYDIA PROBE AMP
CT Probe RNA: POSITIVE — AB
GC Probe RNA: NEGATIVE

## 2014-06-14 LAB — WET PREP BY MOLECULAR PROBE
Candida species: NEGATIVE
Gardnerella vaginalis: POSITIVE — AB
TRICHOMONAS VAG: NEGATIVE

## 2014-06-16 ENCOUNTER — Encounter: Payer: Self-pay | Admitting: Obstetrics & Gynecology

## 2014-06-16 DIAGNOSIS — A749 Chlamydial infection, unspecified: Secondary | ICD-10-CM | POA: Insufficient documentation

## 2014-06-26 ENCOUNTER — Ambulatory Visit: Payer: Medicaid Other | Admitting: Obstetrics

## 2014-06-27 ENCOUNTER — Encounter: Payer: Self-pay | Admitting: *Deleted

## 2014-07-05 ENCOUNTER — Encounter: Payer: Self-pay | Admitting: *Deleted

## 2014-07-10 ENCOUNTER — Other Ambulatory Visit: Payer: Self-pay | Admitting: *Deleted

## 2014-07-10 DIAGNOSIS — A749 Chlamydial infection, unspecified: Secondary | ICD-10-CM

## 2014-07-10 DIAGNOSIS — B9689 Other specified bacterial agents as the cause of diseases classified elsewhere: Secondary | ICD-10-CM

## 2014-07-10 DIAGNOSIS — N760 Acute vaginitis: Secondary | ICD-10-CM

## 2014-07-10 MED ORDER — AZITHROMYCIN 500 MG PO TABS
1000.0000 mg | ORAL_TABLET | Freq: Every day | ORAL | Status: DC
Start: 2014-07-10 — End: 2014-08-13

## 2014-07-10 MED ORDER — METRONIDAZOLE 500 MG PO TABS: 500.0000 mg | ORAL_TABLET | Freq: Two times a day (BID) | ORAL | Status: DC

## 2014-07-10 NOTE — Progress Notes (Signed)
Patient notified of results. Rx sent to [facility] notified.

## 2014-08-13 ENCOUNTER — Ambulatory Visit: Payer: Medicaid Other

## 2014-08-13 ENCOUNTER — Ambulatory Visit (INDEPENDENT_AMBULATORY_CARE_PROVIDER_SITE_OTHER): Payer: Medicaid Other | Admitting: *Deleted

## 2014-08-13 VITALS — BP 112/74 | HR 75 | Temp 97.7°F | Ht 69.0 in | Wt 166.0 lb

## 2014-08-13 DIAGNOSIS — Z3042 Encounter for surveillance of injectable contraceptive: Secondary | ICD-10-CM

## 2014-08-13 NOTE — Progress Notes (Signed)
Patient in office today for a Depo Injection. Patient is on time for her injection.  Patient tolerated injection well. Patient due for next injection on November 04, 2014  BP 112/74  Pulse 75  Temp(Src) 97.7 F (36.5 C)  Ht 5\' 9"  (1.753 m)  Wt 166 lb (75.297 kg)  BMI 24.50 kg/m2  Breastfeeding? No  Administrations This Visit   medroxyPROGESTERone (DEPO-PROVERA) injection 150 mg   Administered Action Dose Route Administered By   08/13/2014 Given 150 mg Intramuscular Shelda PalAndrea Lynn Casmer Yepiz, LPN

## 2014-08-28 ENCOUNTER — Ambulatory Visit: Payer: Medicaid Other | Admitting: Obstetrics

## 2014-09-12 ENCOUNTER — Ambulatory Visit: Payer: Medicaid Other | Admitting: Obstetrics

## 2014-09-21 ENCOUNTER — Ambulatory Visit: Payer: Medicaid Other | Admitting: Obstetrics

## 2014-11-05 ENCOUNTER — Ambulatory Visit: Payer: Medicaid Other

## 2014-11-05 ENCOUNTER — Encounter: Payer: Self-pay | Admitting: *Deleted

## 2014-11-06 ENCOUNTER — Encounter: Payer: Self-pay | Admitting: Obstetrics & Gynecology

## 2014-11-20 ENCOUNTER — Other Ambulatory Visit: Payer: Medicaid Other

## 2014-12-08 ENCOUNTER — Encounter (HOSPITAL_COMMUNITY): Payer: Self-pay | Admitting: *Deleted

## 2014-12-08 ENCOUNTER — Emergency Department (HOSPITAL_COMMUNITY)
Admission: EM | Admit: 2014-12-08 | Discharge: 2014-12-08 | Disposition: A | Discharge status: Home / Self Care | Payer: Medicaid Other | Attending: Emergency Medicine | Admitting: Emergency Medicine

## 2014-12-08 DIAGNOSIS — Z3202 Encounter for pregnancy test, result negative: Secondary | ICD-10-CM | POA: Diagnosis not present

## 2014-12-08 DIAGNOSIS — Z79899 Other long term (current) drug therapy: Secondary | ICD-10-CM | POA: Diagnosis not present

## 2014-12-08 DIAGNOSIS — N760 Acute vaginitis: Secondary | ICD-10-CM | POA: Insufficient documentation

## 2014-12-08 DIAGNOSIS — B9689 Other specified bacterial agents as the cause of diseases classified elsewhere: Secondary | ICD-10-CM

## 2014-12-08 DIAGNOSIS — Z8619 Personal history of other infectious and parasitic diseases: Secondary | ICD-10-CM | POA: Insufficient documentation

## 2014-12-08 DIAGNOSIS — N898 Other specified noninflammatory disorders of vagina: Secondary | ICD-10-CM | POA: Diagnosis present

## 2014-12-08 LAB — WET PREP, GENITAL
Trich, Wet Prep: NONE SEEN
YEAST WET PREP: NONE SEEN

## 2014-12-08 LAB — COMPREHENSIVE METABOLIC PANEL
ALK PHOS: 41 U/L (ref 39–117)
ALT: 17 U/L (ref 0–35)
AST: 20 U/L (ref 0–37)
Albumin: 3.1 g/dL — ABNORMAL LOW (ref 3.5–5.2)
Anion gap: 6 (ref 5–15)
BILIRUBIN TOTAL: 0.3 mg/dL (ref 0.3–1.2)
BUN: 8 mg/dL (ref 6–23)
CO2: 26 mmol/L (ref 19–32)
CREATININE: 0.82 mg/dL (ref 0.50–1.10)
Calcium: 8.9 mg/dL (ref 8.4–10.5)
Chloride: 108 mmol/L (ref 96–112)
GFR calc Af Amer: >90 mL/min (ref >90)
GFR calc non Af Amer: >90 mL/min (ref >90)
GLUCOSE: 88 mg/dL (ref 70–99)
POTASSIUM: 3.8 mmol/L (ref 3.5–5.1)
SODIUM: 140 mmol/L (ref 135–145)
Total Protein: 5.4 g/dL — ABNORMAL LOW (ref 6.0–8.3)

## 2014-12-08 LAB — URINALYSIS, ROUTINE W REFLEX MICROSCOPIC
Bilirubin Urine: NEGATIVE
Glucose, UA: NEGATIVE mg/dL
Hgb urine dipstick: NEGATIVE
Ketones, ur: NEGATIVE mg/dL
Leukocytes, UA: NEGATIVE
NITRITE: NEGATIVE
PH: 8 (ref 5.0–8.0)
Protein, ur: NEGATIVE mg/dL
SPECIFIC GRAVITY, URINE: 1.024 (ref 1.005–1.030)
Urobilinogen, UA: 0.2 mg/dL (ref 0.0–1.0)

## 2014-12-08 LAB — CBC WITH DIFFERENTIAL/PLATELET
Basophils Absolute: 0 10*3/uL (ref 0.0–0.1)
Basophils Relative: 0 % (ref 0–1)
Eosinophils Absolute: 0.6 10*3/uL (ref 0.0–0.7)
Eosinophils Relative: 8 % — ABNORMAL HIGH (ref 0–5)
HCT: 37.5 % (ref 36.0–46.0)
Hemoglobin: 12.7 g/dL (ref 12.0–15.0)
Lymphocytes Relative: 33 % (ref 12–46)
Lymphs Abs: 2.5 10*3/uL (ref 0.7–4.0)
MCH: 30 pg (ref 26.0–34.0)
MCHC: 33.9 g/dL (ref 30.0–36.0)
MCV: 88.4 fL (ref 78.0–100.0)
MONO ABS: 0.3 10*3/uL (ref 0.1–1.0)
Monocytes Relative: 4 % (ref 3–12)
NEUTROS ABS: 4.1 10*3/uL (ref 1.7–7.7)
Neutrophils Relative %: 55 % (ref 43–77)
Platelets: 255 10*3/uL (ref 150–400)
RBC: 4.24 MIL/uL (ref 3.87–5.11)
RDW: 13.8 % (ref 11.5–15.5)
WBC: 7.6 10*3/uL (ref 4.0–10.5)

## 2014-12-08 LAB — PREGNANCY, URINE: Preg Test, Ur: NEGATIVE

## 2014-12-08 MED ORDER — CEFTRIAXONE SODIUM 250 MG IJ SOLR
250.0000 mg | Freq: Once | INTRAMUSCULAR | Status: AC
  Administered 2014-12-08: 250 mg via INTRAMUSCULAR
  Filled 2014-12-08: qty 250

## 2014-12-08 MED ORDER — METRONIDAZOLE 500 MG PO TABS: 500.0000 mg | ORAL_TABLET | Freq: Two times a day (BID) | ORAL | Status: DC

## 2014-12-08 MED ORDER — AZITHROMYCIN 250 MG PO TABS
1000.0000 mg | ORAL_TABLET | Freq: Once | ORAL | Status: AC
  Administered 2014-12-08: 1000 mg via ORAL
  Filled 2014-12-08: qty 4

## 2014-12-08 MED ORDER — LIDOCAINE HCL (PF) 1 % IJ SOLN
5.0000 mL | Freq: Once | INTRAMUSCULAR | Status: AC
  Administered 2014-12-08: 5 mL

## 2014-12-08 NOTE — ED Notes (Signed)
The pt is c/o a vaginal discharge for 2 days.  No pain  lmp 2 weeks ago

## 2014-12-08 NOTE — Discharge Instructions (Signed)
Please follow up with your primary care physician in 1-2 days. If you do not have one please call the [facility] and wellness Center number listed above. Please take your antibiotic until completion. Please refrain from sexual intercourse for 10 days to allow your antibiotics to work. Please read all discharge instructions and return precautions.    Bacterial Vaginosis Bacterial vaginosis is a vaginal infection that occurs when the normal balance of bacteria in the vagina is disrupted. It results from an overgrowth of certain bacteria. This is the most common vaginal infection in women of childbearing age. Treatment is important to prevent complications, especially in pregnant women, as it can cause a premature delivery. CAUSES  Bacterial vaginosis is caused by an increase in harmful bacteria that are normally present in smaller amounts in the vagina. Several different kinds of bacteria can cause bacterial vaginosis. However, the reason that the condition develops is not fully understood. RISK FACTORS Certain activities or behaviors can put you at an increased risk of developing bacterial vaginosis, including:  Having a new sex partner or multiple sex partners.  Douching.  Using an intrauterine device (IUD) for contraception. Women do not get bacterial vaginosis from toilet seats, bedding, swimming pools, or contact with objects around them. SIGNS AND SYMPTOMS  Some women with bacterial vaginosis have no signs or symptoms. Common symptoms include:  Grey vaginal discharge.  A fishlike odor with discharge, especially after sexual intercourse.  Itching or burning of the vagina and vulva.  Burning or pain with urination. DIAGNOSIS  Your health care provider will take a medical history and examine the vagina for signs of bacterial vaginosis. A sample of vaginal fluid may be taken. Your health care provider will look at this sample under a microscope to check for bacteria and abnormal cells. A  vaginal pH test may also be done.  TREATMENT  Bacterial vaginosis may be treated with antibiotic medicines. These may be given in the form of a pill or a vaginal cream. A second round of antibiotics may be prescribed if the condition comes back after treatment.  HOME CARE INSTRUCTIONS   Only take over-the-counter or prescription medicines as directed by your health care provider.  If antibiotic medicine was prescribed, take it as directed. Make sure you finish it even if you start to feel better.  Do not have sex until treatment is completed.  Tell all sexual partners that you have a vaginal infection. They should see their health care provider and be treated if they have problems, such as a mild rash or itching.  Practice safe sex by using condoms and only having one sex partner. SEEK MEDICAL CARE IF:   Your symptoms are not improving after 3 days of treatment.  You have increased discharge or pain.  You have a fever. MAKE SURE YOU:   Understand these instructions.  Will watch your condition.  Will get help right away if you are not doing well or get worse. FOR MORE INFORMATION  Centers for Disease Control and Prevention, Division of STD Prevention: [employer]Apps[employer] American Sexual Health Association (ASHA): www.ashastd.org  Document Released: 10/26/2005 Document Revised: 08/16/2013 Document Reviewed: 06/07/2013 [facility] Patient Information 2015 [place], [place]. This information is not intended to replace advice given to you by your health care provider. Make sure you discuss any questions you have with your health care provider. Sexually Transmitted Disease A sexually transmitted disease (STD) is a disease or infection that may be passed (transmitted) from person to person, usually during sexual activity.  This may happen by way of saliva, semen, blood, vaginal mucus, or urine. Common STDs include:  Gonorrhea.  Chlamydia.  Syphilis.  HIV and AIDS.  Genital herpes.   Hepatitis B and C.  Trichomonas.  Human papillomavirus (HPV).  Pubic lice.  Scabies. Mites. Bacterial vaginosis. WHAT ARE CAUSES OF STDs? An STD may be caused by bacteria, a virus, or parasites. STDs are often transmitted during sexual activity if one person is infected. However, they may also be transmitted through nonsexual means. STDs may be transmitted after:  Sexual intercourse with an infected person.  Sharing sex toys with an infected person.  Sharing needles with an infected person or using unclean piercing or tattoo needles. Having intimate contact with the genitals, mouth, or rectal areas of an infected person.  Exposure to infected fluids during birth. WHAT ARE THE SIGNS AND SYMPTOMS OF STDs? Different STDs have different symptoms. Some people may not have any symptoms. If symptoms are present, they may include:  Painful or bloody urination.  Pain in the pelvis, abdomen, vagina, anus, throat, or eyes.  A skin rash, itching, or irritation. Growths, ulcerations, blisters, or sores in the genital and anal areas. Abnormal vaginal discharge with or without bad odor.  Penile discharge in men.  Fever.  Pain or bleeding during sexual intercourse.  Swollen glands in the groin area.  Yellow skin and eyes (jaundice). This is seen with hepatitis.  Swollen testicles. Infertility. Sores and blisters in the mouth. HOW ARE STDs DIAGNOSED? To make a diagnosis, your health care provider may:  Take a medical history.  Perform a physical exam.  Take a sample of any discharge to examine. Swab the throat, cervix, opening to the penis, rectum, or vagina for testing. Test a sample of your first morning urine.  Perform blood tests.  Perform a Pap test, if this applies.  Perform a colposcopy.  Perform a laparoscopy.  HOW ARE STDs TREATED? Treatment depends on the STD. Some STDs may be treated but not cured.  Chlamydia, gonorrhea, trichomonas, and syphilis can be  cured with antibiotic medicine.  Genital herpes, hepatitis, and HIV can be treated, but not cured, with prescribed medicines. The medicines lessen symptoms.  Genital warts from HPV can be treated with medicine or by freezing, burning (electrocautery), or surgery. Warts may come back.  HPV cannot be cured with medicine or surgery. However, abnormal areas may be removed from the cervix, vagina, or vulva.  If your diagnosis is confirmed, your recent sexual partners need treatment. This is true even if they are symptom-free or have a negative culture or evaluation. They should not have sex until their health care providers say it is okay. HOW CAN I REDUCE MY RISK OF GETTING AN STD? Take these steps to reduce your risk of getting an STD: Use latex condoms, dental dams, and water-soluble lubricants during sexual activity. Do not use petroleum jelly or oils. Avoid having multiple sex partners. Do not have sex with someone who has other sex partners. Do not have sex with anyone you do not know or who is at high risk for an STD. Avoid risky sex practices that can break your skin. Do not have sex if you have open sores on your mouth or skin. Avoid drinking too much alcohol or taking illegal drugs. Alcohol and drugs can affect your judgment and put you in a vulnerable position. Avoid engaging in oral and anal sex acts. Get vaccinated for HPV and hepatitis. If you have not received these vaccines in  the past, talk to your health care provider about whether one or both might be right for you.  If you are at risk of being infected with HIV, it is recommended that you take a prescription medicine daily to prevent HIV infection. This is called pre-exposure prophylaxis (PrEP). You are considered at risk if: You are a man who has sex with other men (MSM). You are a heterosexual man or woman and are sexually active with more than one partner. You take drugs by injection. You are sexually active with a partner  who has HIV. Talk with your health care provider about whether you are at high risk of being infected with HIV. If you choose to begin PrEP, you should first be tested for HIV. You should then be tested every 3 months for as long as you are taking PrEP.  WHAT SHOULD I DO IF I THINK I HAVE AN STD? See your health care provider.  Tell your sexual partner(s). They should be tested and treated for any STDs. Do not have sex until your health care provider says it is okay. WHEN SHOULD I GET IMMEDIATE MEDICAL CARE? Contact your health care provider right away if:  You have severe abdominal pain. You are a man and notice swelling or pain in your testicles. You are a woman and notice swelling or pain in your vagina. Document Released: 01/16/2003 Document Revised: 10/31/2013 Document Reviewed: 05/16/2013 [facility] Patient Information 2015 [place], [place]. This information is not intended to replace advice given to you by your health care provider. Make sure you discuss any questions you have with your health care provider.

## 2014-12-08 NOTE — ED Provider Notes (Signed)
CSN: [id number]     Arrival date & time 12/08/14  1521 History   First MD Initiated Contact with Patient 12/08/14 1730     Chief Complaint  Patient presents with  . Vaginal Discharge     (Consider location/radiation/quality/duration/timing/severity/associated sxs/prior Treatment) HPI Comments: Patient is a G20P1 24 yo F presenting to the ED for vaginal discharge x 2 days without associated pain. Describes as white, yellow thick malodorous. No medication chart prior to arrival. No modifying factors identified. No recent unprotected sexual intercourse. Last menstrual period was 2 weeks ago, regular cycle. No abdominal surgical history.  Patient is a 24 y.o. female presenting with vaginal discharge. The history is provided by the patient.  Vaginal Discharge Quality:  White, yellow, thick and malodorous Severity:  Moderate Onset quality:  Sudden Duration:  2 days Timing:  Constant Progression:  Unchanged Chronicity:  Recurrent Context: not after intercourse, not after urination, not at rest, not during bowel movement, not during intercourse, not during pregnancy, not during urination, not genital trauma, not recent antibiotic use and not spontaneously   Relieved by:  None tried Worsened by:  Nothing tried Ineffective treatments:  None tried Associated symptoms: no abdominal pain, no dyspareunia, no dysuria, no fever, no genital lesions, no nausea, no rash, no urinary frequency, no urinary hesitancy, no urinary incontinence, no vaginal itching and no vomiting   Risk factors: no endometriosis, no foreign body, no gynecological surgery, no immunosuppression, no new sexual partner, no PID, no prior miscarriage, no STI, no STI exposure, no terminated pregnancy and no unprotected sex     Past Medical History  Diagnosis Date  . History of cold sores   . Medical history non-contributory    Past Surgical History  Procedure Laterality Date  . Wisdom tooth extraction     Family History   Problem Relation Age of Onset  . Alcohol abuse Neg Hx   . Arthritis Neg Hx   . Asthma Neg Hx   . Birth defects Neg Hx   . Cancer Neg Hx   . COPD Neg Hx   . Depression Neg Hx   . Diabetes Neg Hx   . Early death Neg Hx   . Drug abuse Neg Hx   . Hearing loss Neg Hx   . Heart disease Neg Hx   . Hyperlipidemia Neg Hx   . Hypertension Neg Hx   . Kidney disease Neg Hx   . Learning disabilities Neg Hx   . Mental illness Neg Hx   . Mental retardation Neg Hx   . Miscarriages / Stillbirths Neg Hx   . Stroke Neg Hx   . Vision loss Neg Hx    History  Substance Use Topics  . Smoking status: Never Smoker   . Smokeless tobacco: Never Used  . Alcohol Use: No   OB History    Gravida Para Term Preterm AB TAB SAB Ectopic Multiple Living   Review of Systems  Constitutional: Negative for fever.  Gastrointestinal: Negative for nausea, vomiting and abdominal pain.  Genitourinary: Positive for vaginal discharge. Negative for bladder incontinence, dysuria, hesitancy and dyspareunia.  All other systems reviewed and are negative.     Allergies  Review of patient's allergies indicates no known allergies.  Home Medications   Prior to Admission medications   Medication Sig Start Date End Date Taking? Authorizing Provider  valACYclovir (VALTREX) 1000 MG tablet Take 1,000 mg by mouth 2 (  two) times daily as needed (for outbreaks).   Yes Historical Provider, MD  acyclovir ointment (ZOVIRAX) 5 % Apply 1 application topically every 6 (six) hours as needed. Patient not taking: Reported on 12/08/2014 06/11/14   Garlon Hatchet, PA-C  metroNIDAZOLE (FLAGYL) 500 MG tablet Take 1 tablet (500 mg total) by mouth 2 (two) times daily. 12/08/14   Kathleena Freeman L Limmie Schoenberg, PA-C   BP 115/62 mmHg  Pulse 74  Temp(Src) 97.4 F (36.3 C) (Oral)  Resp 16  Ht  (1.778 m)  Wt 158 lb (71.668 kg)  BMI 22.67 kg/m2  SpO2 100%  LMP 11/18/2014 Physical Exam  Constitutional: She is oriented to  person, place, and time. She appears well-developed and well-nourished. No distress.  HENT:  Head: Normocephalic and atraumatic.  Right Ear: External ear normal.  Left Ear: External ear normal.  Nose: Nose normal.  Mouth/Throat: Oropharynx is clear and moist.  Eyes: Conjunctivae are normal.  Neck: Normal range of motion. Neck supple.  Cardiovascular: Normal rate, regular rhythm and normal heart sounds.   Pulmonary/Chest: Effort normal and breath sounds normal.  Abdominal: Soft. Bowel sounds are normal. She exhibits no distension. There is no tenderness. There is no rebound and no guarding.  Musculoskeletal: Normal range of motion.  Neurological: She is alert and oriented to person, place, and time.  Skin: Skin is warm and dry. She is not diaphoretic.  Psychiatric: She has a normal mood and affect.  Nursing note and vitals reviewed.  Exam performed by Francee Piccolo L,  exam chaperoned Date: 12/08/2014 Pelvic exam: normal external genitalia without evidence of trauma. VULVA: normal appearing vulva with no masses, tenderness or lesion. VAGINA: normal appearing vagina with normal color and discharge, no lesions. CERVIX: normal appearing cervix without lesions, cervical motion tenderness absent, cervical os closed with out purulent discharge; vaginal discharge - white and thick, Wet prep and DNA probe for chlamydia and GC obtained.   ADNEXA: normal adnexa in size, nontender and no masses UTERUS: uterus is normal size, shape, consistency and nontender.   ED Course  Procedures (including critical care time) Medications  cefTRIAXone (ROCEPHIN) injection 250 mg (250 mg Intramuscular Given 12/08/14 1858)  azithromycin (ZITHROMAX) tablet 1,000 mg (1,000 mg Oral Given 12/08/14 1858)  lidocaine (PF) (XYLOCAINE) 1 % injection 5 mL (5 mLs Other Given 12/08/14 1901)    Labs Review Labs Reviewed  WET PREP, GENITAL - Abnormal; Notable for the following:    Clue Cells Wet Prep HPF POC FEW (*)     WBC, Wet Prep HPF POC MODERATE (*)    All other components within normal limits  CBC WITH DIFFERENTIAL/PLATELET - Abnormal; Notable for the following:    Eosinophils Relative 8 (*)    All other components within normal limits  COMPREHENSIVE METABOLIC PANEL - Abnormal; Notable for the following:    Total Protein 5.4 (*)    Albumin 3.1 (*)    All other components within normal limits  URINALYSIS, ROUTINE W REFLEX MICROSCOPIC - Abnormal; Notable for the following:    APPearance CLOUDY (*)    All other components within normal limits  PREGNANCY, URINE  GC/CHLAMYDIA PROBE AMP ([place])    Imaging Review No results found.   EKG Interpretation None      Discussed with prior results with patient who requests to be prophylactically treated for gonorrhea and chlamydia given moderate amount of white blood cells on wet prep. MDM   Final diagnoses:  Bacterial vaginosis    Filed Vitals:  12/08/14 1900  BP: 115/62  Pulse: 74  Temp:   Resp: 16   Afebrile, NAD, non-toxic appearing, AAOx4. Patient to be discharged with instructions to follow up with OBGYN. Pt understands GC/Chlamydia cultures pending and that they will need to inform all sexual partners within the last 6 months if results return positive. Pt has been treated prophylacticly with azithromycin and rocephin due to pts history, pelvic exam, and wet prep with increased WBCs. Pt advised that she will receive a call in 48 hours if the test is positive and to refrain from sexual activity for 48 hours. If the test is positive, pt is advised to refrain from sexual activity for 10 days for the medicine to take effect.  Pt not concerning for PID because hemodynamically stable and no cervical motion tenderness on pelvic exam. Pt has also been treated with flagyl for Bacterial Vaginosis. Pt has been advised to not drink alcohol while on this medication.   Discussed that because pt has had recent unprotected sex, might want to  consider getting tested for HIV as well. Counseled pt that latex condoms are the only way to prevent against STDs or HIV.  Patient is stable at time of discharge   Jeannetta EllisJennifer L Parminder Cupples, PA-C 12/08/14 2105  Linwood DibblesJon Knapp, MD 12/12/14 [phone]

## 2014-12-10 ENCOUNTER — Telehealth: Payer: Self-pay

## 2014-12-10 DIAGNOSIS — Z029 Encounter for administrative examinations, unspecified: Secondary | ICD-10-CM

## 2014-12-10 LAB — GC/CHLAMYDIA PROBE AMP (~~LOC~~) NOT AT ARMC
CHLAMYDIA, DNA PROBE: POSITIVE — AB
Neisseria Gonorrhea: NEGATIVE
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
## 2014-12-11 ENCOUNTER — Telehealth (HOSPITAL_BASED_OUTPATIENT_CLINIC_OR_DEPARTMENT_OTHER): Payer: Self-pay | Admitting: Emergency Medicine

## 2014-12-12 ENCOUNTER — Telehealth (HOSPITAL_BASED_OUTPATIENT_CLINIC_OR_DEPARTMENT_OTHER): Payer: Self-pay | Admitting: Emergency Medicine
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
## 2014-12-14 ENCOUNTER — Other Ambulatory Visit: Payer: Self-pay | Admitting: *Deleted

## 2014-12-14 ENCOUNTER — Telehealth: Payer: Self-pay | Admitting: *Deleted

## 2014-12-14 ENCOUNTER — Telehealth (HOSPITAL_BASED_OUTPATIENT_CLINIC_OR_DEPARTMENT_OTHER): Payer: Self-pay | Admitting: *Deleted

## 2014-12-14 MED ORDER — METRONIDAZOLE 500 MG PO TABS: 500.0000 mg | ORAL_TABLET | Freq: Two times a day (BID) | ORAL | Status: DC
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
## 2014-12-24 ENCOUNTER — Telehealth (HOSPITAL_COMMUNITY): Payer: Self-pay

## 2014-12-24 NOTE — ED Notes (Signed)
Unable to contact pt by mail or telephone. Unable to communicate lab results or treatment changes. 

## 2015-02-05 ENCOUNTER — Emergency Department (HOSPITAL_COMMUNITY)
Admission: EM | Admit: 2015-02-05 | Discharge: 2015-02-06 | Disposition: A | Discharge status: Home / Self Care | Payer: Medicaid Other | Attending: Emergency Medicine | Admitting: Emergency Medicine

## 2015-02-05 ENCOUNTER — Encounter (HOSPITAL_COMMUNITY): Payer: Self-pay | Admitting: *Deleted

## 2015-02-05 DIAGNOSIS — N898 Other specified noninflammatory disorders of vagina: Secondary | ICD-10-CM | POA: Diagnosis present

## 2015-02-05 DIAGNOSIS — Z202 Contact with and (suspected) exposure to infections with a predominantly sexual mode of transmission: Secondary | ICD-10-CM | POA: Diagnosis not present

## 2015-02-05 DIAGNOSIS — Z3202 Encounter for pregnancy test, result negative: Secondary | ICD-10-CM | POA: Insufficient documentation

## 2015-02-05 DIAGNOSIS — Z8619 Personal history of other infectious and parasitic diseases: Secondary | ICD-10-CM | POA: Insufficient documentation

## 2015-02-05 LAB — PREGNANCY, URINE: Preg Test, Ur: NEGATIVE

## 2015-02-05 LAB — URINALYSIS, ROUTINE W REFLEX MICROSCOPIC
BILIRUBIN URINE: NEGATIVE
GLUCOSE, UA: NEGATIVE mg/dL
Hgb urine dipstick: NEGATIVE
Ketones, ur: NEGATIVE mg/dL
Nitrite: NEGATIVE
PROTEIN: NEGATIVE mg/dL
Specific Gravity, Urine: 1.031 — ABNORMAL HIGH (ref 1.005–1.030)
Urobilinogen, UA: 0.2 mg/dL (ref 0.0–1.0)
pH: 6 (ref 5.0–8.0)

## 2015-02-05 LAB — URINE MICROSCOPIC-ADD ON

## 2015-02-05 NOTE — ED Notes (Signed)
The pt has had a vaginal discharge foir 2 days she wants to be checjed fior a strd.  lmp  March 18th

## 2015-02-05 NOTE — ED Notes (Signed)
Pelvic cart at BS.  

## 2015-02-06 ENCOUNTER — Telehealth (HOSPITAL_BASED_OUTPATIENT_CLINIC_OR_DEPARTMENT_OTHER): Payer: Self-pay | Admitting: Emergency Medicine

## 2015-02-06 LAB — GC/CHLAMYDIA PROBE AMP (~~LOC~~) NOT AT ARMC
Chlamydia: NEGATIVE
Neisseria Gonorrhea: NEGATIVE

## 2015-02-06 LAB — RPR: RPR Ser Ql: NONREACTIVE

## 2015-02-06 LAB — WET PREP, GENITAL
Trich, Wet Prep: NONE SEEN
Yeast Wet Prep HPF POC: NONE SEEN

## 2015-02-06 LAB — HIV ANTIBODY (ROUTINE TESTING W REFLEX): HIV Screen 4th Generation wRfx: NONREACTIVE

## 2015-02-06 MED ORDER — LIDOCAINE HCL (PF) 1 % IJ SOLN
INTRAMUSCULAR | Status: AC
  Administered 2015-02-06: 0.9 mL
  Filled 2015-02-06: qty 5

## 2015-02-06 MED ORDER — CEFTRIAXONE SODIUM 250 MG IJ SOLR
250.0000 mg | Freq: Once | INTRAMUSCULAR | Status: AC
Start: 2015-02-06 — End: 2015-02-06
  Administered 2015-02-06: 250 mg via INTRAMUSCULAR
  Filled 2015-02-06: qty 250

## 2015-02-06 MED ORDER — AZITHROMYCIN 250 MG PO TABS
1000.0000 mg | ORAL_TABLET | Freq: Once | ORAL | Status: AC
  Administered 2015-02-06: 1000 mg via ORAL
  Filled 2015-02-06: qty 4

## 2015-02-06 NOTE — ED Notes (Signed)
Pt stable, ambulatory, denies pain.
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
## 2015-02-06 NOTE — ED Notes (Signed)
PA at BS.  

## 2015-02-06 NOTE — Discharge Instructions (Signed)
[facility] AS [facility].

## 2015-02-08 ENCOUNTER — Telehealth (HOSPITAL_BASED_OUTPATIENT_CLINIC_OR_DEPARTMENT_OTHER): Payer: Self-pay | Admitting: Emergency Medicine

## 2015-02-08 ENCOUNTER — Ambulatory Visit: Payer: Medicaid Other | Admitting: Obstetrics

## 2015-02-12 ENCOUNTER — Other Ambulatory Visit: Payer: Self-pay | Admitting: Obstetrics

## 2015-04-04 ENCOUNTER — Emergency Department (HOSPITAL_COMMUNITY): Payer: Medicaid Other

## 2015-04-04 ENCOUNTER — Emergency Department (HOSPITAL_COMMUNITY)
Admission: EM | Admit: 2015-04-04 | Discharge: 2015-04-04 | Disposition: A | Discharge status: Home / Self Care | Payer: Medicaid Other | Attending: Emergency Medicine | Admitting: Emergency Medicine

## 2015-04-04 ENCOUNTER — Encounter (HOSPITAL_COMMUNITY): Payer: Self-pay | Admitting: Emergency Medicine

## 2015-04-04 DIAGNOSIS — Z3490 Encounter for supervision of normal pregnancy, unspecified, unspecified trimester: Secondary | ICD-10-CM

## 2015-04-04 DIAGNOSIS — O039 Complete or unspecified spontaneous abortion without complication: Secondary | ICD-10-CM | POA: Diagnosis present

## 2015-04-04 DIAGNOSIS — Z3A08 8 weeks gestation of pregnancy: Secondary | ICD-10-CM | POA: Insufficient documentation

## 2015-04-04 DIAGNOSIS — Z792 Long term (current) use of antibiotics: Secondary | ICD-10-CM | POA: Diagnosis not present

## 2015-04-04 DIAGNOSIS — D649 Anemia, unspecified: Secondary | ICD-10-CM | POA: Insufficient documentation

## 2015-04-04 DIAGNOSIS — O034 Incomplete spontaneous abortion without complication: Secondary | ICD-10-CM

## 2015-04-04 DIAGNOSIS — N939 Abnormal uterine and vaginal bleeding, unspecified: Secondary | ICD-10-CM

## 2015-04-04 LAB — CBC WITH DIFFERENTIAL/PLATELET
BASOS PCT: 0 % (ref 0–1)
Basophils Absolute: 0 10*3/uL (ref 0.0–0.1)
EOS PCT: 3 % (ref 0–5)
Eosinophils Absolute: 0.3 10*3/uL (ref 0.0–0.7)
HCT: 34.6 % — ABNORMAL LOW (ref 36.0–46.0)
Hemoglobin: 11.7 g/dL — ABNORMAL LOW (ref 12.0–15.0)
LYMPHS ABS: 1.5 10*3/uL (ref 0.7–4.0)
Lymphocytes Relative: 18 % (ref 12–46)
MCH: 29.9 pg (ref 26.0–34.0)
MCHC: 33.8 g/dL (ref 30.0–36.0)
MCV: 88.5 fL (ref 78.0–100.0)
Monocytes Absolute: 0.4 10*3/uL (ref 0.1–1.0)
Monocytes Relative: 5 % (ref 3–12)
NEUTROS ABS: 6.2 10*3/uL (ref 1.7–7.7)
Neutrophils Relative %: 74 % (ref 43–77)
Platelets: 205 10*3/uL (ref 150–400)
RBC: 3.91 MIL/uL (ref 3.87–5.11)
RDW: 13.4 % (ref 11.5–15.5)
WBC: 8.4 10*3/uL (ref 4.0–10.5)

## 2015-04-04 LAB — BASIC METABOLIC PANEL
Anion gap: 4 — ABNORMAL LOW (ref 5–15)
BUN: 8 mg/dL (ref 6–20)
CHLORIDE: 107 mmol/L (ref 101–111)
CO2: 27 mmol/L (ref 22–32)
CREATININE: 0.65 mg/dL (ref 0.44–1.00)
Calcium: 8.7 mg/dL — ABNORMAL LOW (ref 8.9–10.3)
GFR calc Af Amer: >60 mL/min (ref >60)
GFR calc non Af Amer: >60 mL/min (ref >60)
GLUCOSE: 95 mg/dL (ref 65–99)
POTASSIUM: 3.9 mmol/L (ref 3.5–5.1)
Sodium: 138 mmol/L (ref 135–145)

## 2015-04-04 LAB — HCG, QUANTITATIVE, PREGNANCY: hCG, Beta Chain, Quant, S: 1791 m[IU]/mL — ABNORMAL HIGH (ref <5)

## 2015-04-04 LAB — ABO/RH: ABO/RH(D): O POS

## 2015-04-04 MED ORDER — ACETAMINOPHEN 500 MG PO TABS
1000.0000 mg | ORAL_TABLET | Freq: Once | ORAL | Status: AC
  Administered 2015-04-04: 1000 mg via ORAL
  Filled 2015-04-04: qty 2

## 2015-04-04 NOTE — ED Provider Notes (Signed)
CSN: [id number]     Arrival date & time 04/04/15  1118 History   First MD Initiated Contact with Patient 04/04/15 1154     Chief Complaint  Patient presents with  . Threatened Miscarriage     (Consider location/radiation/quality/duration/timing/severity/associated sxs/prior Treatment) HPI   G2P1001, currently pregnant with LMP March 21 that was shorter than usual, 2 positive home pregnancy tests, developed cramping pain and vaginal bleeding that began this morning.  Pain feels like a very intense menstrual cramp. Vaginal bleeding is heavy, changing pad every 2-4 hours.  Denies passage of clots or other tissue.  Associate body aches, nausea, headache, loose stool (single episode).  Has not seen obgyn yet this pregnancy.  GYN is Dr Coral Ceo.    Past Medical History  Diagnosis Date  . History of cold sores   . Medical history non-contributory    Past Surgical History  Procedure Laterality Date  . Wisdom tooth extraction     Family History  Problem Relation Age of Onset  . Alcohol abuse Neg Hx   . Arthritis Neg Hx   . Asthma Neg Hx   . Birth defects Neg Hx   . Cancer Neg Hx   . COPD Neg Hx   . Depression Neg Hx   . Diabetes Neg Hx   . Early death Neg Hx   . Drug abuse Neg Hx   . Hearing loss Neg Hx   . Heart disease Neg Hx   . Hyperlipidemia Neg Hx   . Hypertension Neg Hx   . Kidney disease Neg Hx   . Learning disabilities Neg Hx   . Mental illness Neg Hx   . Mental retardation Neg Hx   . Miscarriages / Stillbirths Neg Hx   . Stroke Neg Hx   . Vision loss Neg Hx    History  Substance Use Topics  . Smoking status: Never Smoker   . Smokeless tobacco: Never Used  . Alcohol Use: No   OB History    Gravida Para Term Preterm AB TAB SAB Ectopic Multiple Living   Review of Systems  All other systems reviewed and are negative.     Allergies  Review of patient's allergies indicates no known allergies.  Home Medications   Prior to  Admission medications   Medication Sig Start Date End Date Taking? Authorizing Provider  acyclovir ointment (ZOVIRAX) 5 % Apply 1 application topically every 6 (six) hours as needed. Patient not taking: Reported on 12/08/2014 06/11/14   Garlon Hatchet, PA-C  metroNIDAZOLE (FLAGYL) 500 MG tablet TAKE ONE TABLET BY MOUTH TWICE DAILY 02/13/15   Brock Bad, MD  valACYclovir (VALTREX) 1000 MG tablet Take 1,000 mg by mouth 2 (two) times daily as needed (for outbreaks).    Historical Provider, MD   BP 120/64 mmHg  Pulse 59  Temp(Src) 97.9 F (36.6 C) (Oral)  Resp 16  SpO2 99%  LMP 02/02/2015 Physical Exam  Constitutional: She appears well-developed and well-nourished. No distress.  HENT:  Head: Normocephalic and atraumatic.  Neck: Neck supple.  Cardiovascular: Normal rate and regular rhythm.   Pulmonary/Chest: Effort normal and breath sounds normal. No respiratory distress. She has no wheezes. She has no rales.  Abdominal: Soft. She exhibits no distension. There is generalized tenderness. There is no rebound and no guarding.  Generalized abdominal tenderness, worst in suprapubic area.    Genitourinary: There is tenderness and bleeding in  the vagina. There is a foreign body in the vagina.  Solid purple tissue coming through cervix.  Os is open.  Large amount of dark red blood in vaginal vault.    Neurological: She is alert.  Skin: She is not diaphoretic.  Nursing note and vitals reviewed.   ED Course  Procedures (including critical care time) Labs Review Labs Reviewed  HCG, QUANTITATIVE, PREGNANCY - Abnormal; Notable for the following:    hCG, Beta Chain, Quant, S 1791 (*)    All other components within normal limits  BASIC METABOLIC PANEL - Abnormal; Notable for the following:    Calcium 8.7 (*)    Anion gap 4 (*)    All other components within normal limits  CBC WITH DIFFERENTIAL/PLATELET - Abnormal; Notable for the following:    Hemoglobin 11.7 (*)    HCT 34.6 (*)    All other  components within normal limits  ABO/RH    Imaging Review [place] Ob Comp Less 14 Wks  04/04/2015   CLINICAL DATA:  Heavy vaginal bleeding. Eight weeks and 1 day pregnant by last menstrual period. Quantitative beta HCG 1,791.  EXAM: OBSTETRIC <14 WK US AND TRANSVAGINAL OB US  TECHNIQUE: Both transabdominal and transvaginal ultrasound examinations were performed for complete evaluation of the gestation as well as the maternal uterus, adnexal regions, and pelvic cul-de-sac. Transvaginal technique was performed to assess early pregnancy.  COMPARISON:  09/30/2013.  FINDINGS: Intrauterine gestational sac: Not visualized  Yolk sac:  Not visualized  Embryo:  Not visualized  Cardiac Activity: Not visualized  Maternal uterus/adnexae: The endometrium measures 11.2 mm in thickness with mild heterogeneity centrally. Otherwise, normal appearing uterus. Normal appearing ovaries. Trace free peritoneal fluid. No adnexal mass seen.  IMPRESSION: 1. No intrauterine or ectopic pregnancy visualized. Findings are suspicious but not yet definitive for failed pregnancy. Recommend follow-up US in 10-14 days for definitive diagnosis. This recommendation follows SRU consensus guidelines: Diagnostic Criteria for Nonviable Pregnancy Early in the First Trimester. Malva Limes Engl J Med 2013; 161:0960-45; 369:1443-51. 2. Mildly heterogeneous endometrium. This could represent blood clot or mild retained products of conception.   Electronically Signed   By: Beckie SaltsSteven  Reid M.D.   On: 04/04/2015 16:02   [place] Ob Transvaginal  04/04/2015   CLINICAL DATA:  Heavy vaginal bleeding. Eight weeks and 1 day pregnant by last menstrual period. Quantitative beta HCG 1,791.  EXAM: OBSTETRIC <14 WK US AND TRANSVAGINAL OB US  TECHNIQUE: Both transabdominal and transvaginal ultrasound examinations were performed for complete evaluation of the gestation as well as the maternal uterus, adnexal regions, and pelvic cul-de-sac. Transvaginal technique was performed to assess early pregnancy.   COMPARISON:  09/30/2013.  FINDINGS: Intrauterine gestational sac: Not visualized  Yolk sac:  Not visualized  Embryo:  Not visualized  Cardiac Activity: Not visualized  Maternal uterus/adnexae: The endometrium measures 11.2 mm in thickness with mild heterogeneity centrally. Otherwise, normal appearing uterus. Normal appearing ovaries. Trace free peritoneal fluid. No adnexal mass seen.  IMPRESSION: 1. No intrauterine or ectopic pregnancy visualized. Findings are suspicious but not yet definitive for failed pregnancy. Recommend follow-up US in 10-14 days for definitive diagnosis. This recommendation follows SRU consensus guidelines: Diagnostic Criteria for Nonviable Pregnancy Early in the First Trimester. Malva Limes Engl J Med 2013; 409:8119-14; 369:1443-51. 2. Mildly heterogeneous endometrium. This could represent blood clot or mild retained products of conception.   Electronically Signed   By: Beckie SaltsSteven  Reid M.D.   On: 04/04/2015 16:02     EKG Interpretation None  MDM   Final diagnoses:  Vaginal bleeding  Pregnant  Inevitable spontaneous abortion  Mild anemia    Afebrile nontoxic patient with positive home pregnancy tests developed suprapubic crampy pain, heavy vaginal bleeding.  HCG quant 1791, c/w 4-[redacted] week gestation.  O positive blood type.  Labs remarkable for mild anemia.   Pelvic concerning for open os and grey/purple tissue coming through os and in the vagina, suspect inevitable abortion.  Pt did not want to be pregnant and this is not terrible new to her.  I have discussed my suspicions and the findings I noted on pelvic exam with her.  GYN is Coral Ceo.  [place] confirms there is no intrauterine or ectopic pregnancy, though there appears to be clot vs retained POC.  Pt d/c home with GYN follow up.  Discussed result, findings, treatment, and follow up  with patient.  Pt given return precautions.  Pt verbalizes understanding and agrees with plan.        [place], PA-C 04/04/15 1613  Elwin Mocha,  MD 04/05/15 [phone]

## 2015-04-04 NOTE — ED Notes (Addendum)
Pt sts she had a positive home pregnancy test last week. Today she started having heavy bleeding that is worse than her typical menstrual cycle. Pt sts she was probably approx [redacted] weeks pregnant. Lmp end of march.

## 2015-04-04 NOTE — ED Notes (Signed)
Patient states that she took a home pregnancy test 1 week ago because she was roughly a month late on her menstrual cycle.  States it came back positive and she woke up this morning with aching pains in the left lower abdomen with large amounts of bleeding.

## 2015-04-04 NOTE — Discharge Instructions (Signed)
Read the information below.  You may return to the Emergency Department at any time for worsening condition or any new symptoms that concern you.  If you develop high fevers, worsening abdominal pain, uncontrolled vomiting, foul smelling discharge, or are unable to tolerate fluids by mouth, go directly to [facility] or return to the ER for a recheck.     Miscarriage A miscarriage is the sudden loss of an unborn baby (fetus) before the 20th week of pregnancy. Most miscarriages happen in the first 3 months of pregnancy. Sometimes, it happens before a woman even knows she is pregnant. A miscarriage is also called a "spontaneous miscarriage" or "early pregnancy loss." Having a miscarriage can be an emotional experience. Talk with your caregiver about any questions you may have about miscarrying, the grieving process, and your future pregnancy plans. CAUSES   Problems with the fetal chromosomes that make it impossible for the baby to develop normally. Problems with the baby's genes or chromosomes are most often the result of errors that occur, by chance, as the embryo divides and grows. The problems are not inherited from the parents.  Infection of the cervix or uterus.   Hormone problems.   Problems with the cervix, such as having an incompetent cervix. This is when the tissue in the cervix is not strong enough to hold the pregnancy.   Problems with the uterus, such as an abnormally shaped uterus, uterine fibroids, or congenital abnormalities.   Certain medical conditions.   Smoking, drinking alcohol, or taking illegal drugs.   Trauma.  Often, the cause of a miscarriage is unknown.  SYMPTOMS   Vaginal bleeding or spotting, with or without cramps or pain.  Pain or cramping in the abdomen or lower back.  Passing fluid, tissue, or blood clots from the vagina. DIAGNOSIS  Your caregiver will perform a physical exam. You may also have an ultrasound to confirm the miscarriage. Blood  or urine tests may also be ordered. TREATMENT   Sometimes, treatment is not necessary if you naturally pass all the fetal tissue that was in the uterus. If some of the fetus or placenta remains in the body (incomplete miscarriage), tissue left behind may become infected and must be removed. Usually, a dilation and curettage (D and C) procedure is performed. During a D and C procedure, the cervix is widened (dilated) and any remaining fetal or placental tissue is gently removed from the uterus.  Antibiotic medicines are prescribed if there is an infection. Other medicines may be given to reduce the size of the uterus (contract) if there is a lot of bleeding.  If you have Rh negative blood and your baby was Rh positive, you will need a Rh immunoglobulin shot. This shot will protect any future baby from having Rh blood problems in future pregnancies. HOME CARE INSTRUCTIONS   Your caregiver may order bed rest or may allow you to continue light activity. Resume activity as directed by your caregiver.  Have someone help with home and family responsibilities during this time.   Keep track of the number of sanitary pads you use each day and how soaked (saturated) they are. Write down this information.   Do not use tampons. Do not douche or have sexual intercourse until approved by your caregiver.   Only take over-the-counter or prescription medicines for pain or discomfort as directed by your caregiver.   Do not take aspirin. Aspirin can cause bleeding.   Keep all follow-up appointments with your caregiver.   If  you or your partner have problems with grieving, talk to your caregiver or seek counseling to help cope with the pregnancy loss. Allow enough time to grieve before trying to get pregnant again.  SEEK IMMEDIATE MEDICAL CARE IF:   You have severe cramps or pain in your back or abdomen.  You have a fever.  You pass large blood clots (walnut-sized or larger) ortissue from your  vagina. Save any tissue for your caregiver to inspect.   Your bleeding increases.   You have a thick, bad-smelling vaginal discharge.  You become lightheaded, weak, or you faint.   You have chills.  MAKE SURE YOU:  Understand these instructions.  Will watch your condition.  Will get help right away if you are not doing well or get worse. Document Released: 04/21/2001 Document Revised: 02/20/2013 Document Reviewed: 12/15/2011 [facility] Patient Information 2015 [employer], [place]. This information is not intended to replace advice given to you by your health care provider. Make sure you discuss any questions you have with your health care provider.

## 2015-04-29 ENCOUNTER — Encounter: Payer: Self-pay | Admitting: Obstetrics

## 2015-04-29 ENCOUNTER — Ambulatory Visit (INDEPENDENT_AMBULATORY_CARE_PROVIDER_SITE_OTHER): Payer: Medicaid Other | Admitting: Obstetrics

## 2015-04-29 VITALS — BP 111/69 | HR 70 | Temp 98.0°F | Ht 70.0 in | Wt 148.0 lb

## 2015-04-29 DIAGNOSIS — Z3202 Encounter for pregnancy test, result negative: Secondary | ICD-10-CM

## 2015-04-29 DIAGNOSIS — Z30013 Encounter for initial prescription of injectable contraceptive: Secondary | ICD-10-CM

## 2015-04-29 DIAGNOSIS — O039 Complete or unspecified spontaneous abortion without complication: Secondary | ICD-10-CM | POA: Diagnosis not present

## 2015-04-29 LAB — POCT URINE PREGNANCY: PREG TEST UR: NEGATIVE

## 2015-04-29 MED ORDER — MEDROXYPROGESTERONE ACETATE 150 MG/ML IM SUSP: 150.0000 mg | INTRAMUSCULAR | Status: DC

## 2015-04-29 NOTE — Addendum Note (Signed)
Addended by: Henriette Combs on: 04/29/2015 03:25 PM   Modules accepted: Orders

## 2015-04-29 NOTE — Progress Notes (Signed)
Patient ID: Carrie Nielsen, female   DOB: 1991-02-10, 24 y.o.   MRN: [id number]  Chief Complaint  Patient presents with  . Follow-up    Miscarriage     HPI Carrie Nielsen is a 24 y.o. female.  S/P spontaneous abortion ( SAB ), complete ~ 2 weeks ago.  No complaints.  Scant vaginal spotting.  Denies pelvic pain, fever/chills or dysuria.  Wants Depo Provera for contraception. HPI  Past Medical History  Diagnosis Date  . History of cold sores   . Medical history non-contributory     Past Surgical History  Procedure Laterality Date  . Wisdom tooth extraction      Family History  Problem Relation Age of Onset  . Alcohol abuse Neg Hx   . Arthritis Neg Hx   . Asthma Neg Hx   . Birth defects Neg Hx   . Cancer Neg Hx   . COPD Neg Hx   . Depression Neg Hx   . Diabetes Neg Hx   . Early death Neg Hx   . Drug abuse Neg Hx   . Hearing loss Neg Hx   . Heart disease Neg Hx   . Hyperlipidemia Neg Hx   . Hypertension Neg Hx   . Kidney disease Neg Hx   . Learning disabilities Neg Hx   . Mental illness Neg Hx   . Mental retardation Neg Hx   . Miscarriages / Stillbirths Neg Hx   . Stroke Neg Hx   . Vision loss Neg Hx     Social History History  Substance Use Topics  . Smoking status: Never Smoker   . Smokeless tobacco: Never Used  . Alcohol Use: No    No Known Allergies  Current Outpatient Prescriptions  Medication Sig Dispense Refill  . acyclovir ointment (ZOVIRAX) 5 % Apply 1 application topically every 6 (six) hours as needed. (Patient not taking: Reported on 12/08/2014) 15 g 0  . medroxyPROGESTERone (DEPO-PROVERA) 150 MG/ML injection Inject 1 mL (150 mg total) into the muscle every 3 (three) months. 1 mL 3  . metroNIDAZOLE (FLAGYL) 500 MG tablet TAKE ONE TABLET BY MOUTH TWICE DAILY (Patient not taking: Reported on 04/29/2015) 14 tablet 0  . valACYclovir (VALTREX) 1000 MG tablet Take 1,000 mg by mouth 2 (two) times daily as needed (for outbreaks).     No current  facility-administered medications for this visit.    Review of Systems Review of Systems Constitutional: negative for fatigue and weight loss Respiratory: negative for cough and wheezing Cardiovascular: negative for chest pain, fatigue and palpitations Gastrointestinal: negative for abdominal pain and change in bowel habits Genitourinary:negative Integument/breast: negative for nipple discharge Musculoskeletal:negative for myalgias Neurological: negative for gait problems and tremors Behavioral/Psych: negative for abusive relationship, depression Endocrine: negative for temperature intolerance     Blood pressure 111/69, pulse 70, temperature 98 F (36.7 C), height  (1.778 m), weight 148 lb (67.132 kg), last menstrual period 02/02/2015, not currently breastfeeding.  Physical Exam Physical Exam General:   alert  Skin:   no rash or abnormalities  Lungs:   clear to auscultation bilaterally  Heart:   regular rate and rhythm, S1, S2 normal, no murmur, click, rub or gallop  Breasts:   normal without suspicious masses, skin or nipple changes or axillary nodes  Abdomen:  normal findings: no organomegaly, soft, non-tender and no hernia  Pelvis:  External genitalia: normal general appearance Urinary system: urethral meatus normal and bladder without fullness, nontender Vaginal: normal without tenderness, induration or masses  Cervix: normal appearance Adnexa: normal bimanual exam Uterus: anteverted and non-tender, normal size      Data Reviewed Labs Pathology  Assessment     SAB, complete.  Doing well.   Contraceptive management.  Wants Depo Provera.  Plan    Depo Provera Rx. F/U 4 weeks.  No orders of the defined types were placed in this encounter.   Meds ordered this encounter  Medications  . medroxyPROGESTERone (DEPO-PROVERA) 150 MG/ML injection    Sig: Inject 1 mL (150 mg total) into the muscle every 3 (three) months.    Dispense:  1 mL    Refill:  3

## 2015-04-29 NOTE — Addendum Note (Signed)
Addended by: Henriette Combs on: 04/29/2015 11:52 AM   Modules accepted: Orders

## 2015-05-02 ENCOUNTER — Other Ambulatory Visit: Payer: Self-pay | Admitting: Obstetrics

## 2015-05-02 DIAGNOSIS — N760 Acute vaginitis: Principal | ICD-10-CM

## 2015-05-02 DIAGNOSIS — A549 Gonococcal infection, unspecified: Secondary | ICD-10-CM

## 2015-05-02 DIAGNOSIS — B9689 Other specified bacterial agents as the cause of diseases classified elsewhere: Secondary | ICD-10-CM

## 2015-05-02 LAB — SURESWAB, VAGINOSIS/VAGINITIS PLUS
ATOPOBIUM VAGINAE: 7 Log (cells/mL)
C. ALBICANS, DNA: NOT DETECTED
C. PARAPSILOSIS, DNA: NOT DETECTED
C. TRACHOMATIS RNA, TMA: NOT DETECTED
C. TROPICALIS, DNA: NOT DETECTED
C. glabrata, DNA: NOT DETECTED
Gardnerella vaginalis: >8 Log (cells/mL)
LACTOBACILLUS SPECIES: NOT DETECTED Log (cells/mL)
MEGASPHAERA SPECIES: >8 Log (cells/mL)
N. gonorrhoeae RNA, TMA: DETECTED — AB
T. vaginalis RNA, QL TMA: NOT DETECTED

## 2015-05-02 MED ORDER — CEFTRIAXONE SODIUM 250 MG IJ SOLR: 250.0000 mg | Freq: Once | INTRAMUSCULAR | Status: DC

## 2015-05-02 MED ORDER — AZITHROMYCIN 250 MG PO TABS: 1000.0000 mg | ORAL_TABLET | Freq: Once | ORAL | Status: DC

## 2015-05-02 MED ORDER — METRONIDAZOLE 500 MG PO TABS: 500.0000 mg | ORAL_TABLET | Freq: Two times a day (BID) | ORAL | Status: DC

## 2015-05-17 ENCOUNTER — Encounter: Payer: Self-pay | Admitting: *Deleted

## 2015-05-28 ENCOUNTER — Ambulatory Visit: Payer: Medicaid Other | Admitting: Obstetrics

## 2015-05-28 ENCOUNTER — Ambulatory Visit (INDEPENDENT_AMBULATORY_CARE_PROVIDER_SITE_OTHER): Payer: Medicaid Other | Admitting: *Deleted

## 2015-05-28 VITALS — BP 109/71 | HR 63 | Temp 98.5°F | Ht 70.0 in | Wt 144.0 lb

## 2015-05-28 DIAGNOSIS — A549 Gonococcal infection, unspecified: Secondary | ICD-10-CM | POA: Diagnosis not present

## 2015-05-28 DIAGNOSIS — Z3201 Encounter for pregnancy test, result positive: Secondary | ICD-10-CM | POA: Diagnosis not present

## 2015-05-28 DIAGNOSIS — Z3200 Encounter for pregnancy test, result unknown: Secondary | ICD-10-CM

## 2015-05-29 LAB — HCG, QUANTITATIVE, PREGNANCY: hCG, Beta Chain, Quant, S: 15.2 m[IU]/mL

## 2015-05-30 MED ORDER — CEFTRIAXONE SODIUM 1 G IJ SOLR
250.0000 mg | Freq: Once | INTRAMUSCULAR | Status: AC
  Administered 2015-05-28: 250 mg via INTRAMUSCULAR

## 2015-05-30 NOTE — Progress Notes (Signed)
[name] [name] The appointment was changed to a nurse visit. [name] [name], which explains the discharge with odor and were reviewed with the patient. [name] [name] [name] Patient voiced understanding. [name] [name] [name], [name] Patient tolerated injection well.   BP 109/71 mmHg  Pulse 63  Temp(Src) 98.5 F (36.9 C)  Ht  (1.778 m)  Wt 144 lb (65.318 kg)  BMI 20.66 kg/m2  Administrations This Visit    cefTRIAXone (ROCEPHIN) injection 250 mg    Admin Date Action Dose Route Administered By         05/28/2015 Given 250 mg Intramuscular Henriette Combs, LPN

## 2015-05-31 ENCOUNTER — Other Ambulatory Visit: Payer: Medicaid Other

## 2015-06-10 ENCOUNTER — Ambulatory Visit: Payer: Medicaid Other | Admitting: Obstetrics

## 2015-06-11 ENCOUNTER — Ambulatory Visit: Payer: Medicaid Other | Admitting: Obstetrics

## 2015-06-12 ENCOUNTER — Ambulatory Visit: Payer: Medicaid Other | Admitting: Obstetrics

## 2015-06-17 ENCOUNTER — Ambulatory Visit (INDEPENDENT_AMBULATORY_CARE_PROVIDER_SITE_OTHER): Payer: Medicaid Other | Admitting: Obstetrics

## 2015-06-17 ENCOUNTER — Encounter: Payer: Self-pay | Admitting: Obstetrics

## 2015-06-17 ENCOUNTER — Ambulatory Visit: Payer: Medicaid Other | Admitting: Obstetrics

## 2015-06-17 VITALS — BP 110/76 | HR 87 | Temp 97.7°F | Ht 70.0 in | Wt 145.2 lb

## 2015-06-17 DIAGNOSIS — O3680X1 Pregnancy with inconclusive fetal viability, fetus 1: Secondary | ICD-10-CM

## 2015-06-17 DIAGNOSIS — Z8619 Personal history of other infectious and parasitic diseases: Secondary | ICD-10-CM | POA: Diagnosis not present

## 2015-06-17 DIAGNOSIS — Z3200 Encounter for pregnancy test, result unknown: Secondary | ICD-10-CM

## 2015-06-17 LAB — POCT URINE PREGNANCY: Preg Test, Ur: POSITIVE — AB

## 2015-06-17 MED ORDER — OB COMPLETE PETITE 35-5-1-200 MG PO CAPS: 1.0000 | ORAL_CAPSULE | Freq: Every day | ORAL | Status: DC

## 2015-06-17 NOTE — Progress Notes (Signed)
Patient ID: Carrie Nielsen, female   DOB: [birth year], 24 y.o.   MRN: [id number]  Chief Complaint  Patient presents with  . Follow-up    TOC    HPI Carrie Nielsen is a 24 y.o. female.  Presents for follow up TOC cultures for positive GC that was treated.  Patient also feels that she is pregnant.  HPI  Past Medical History  Diagnosis Date  . History of cold sores   . Medical history non-contributory     Past Surgical History  Procedure Laterality Date  . Wisdom tooth extraction      Family History  Problem Relation Age of Onset  . Alcohol abuse Neg Hx   . Arthritis Neg Hx   . Asthma Neg Hx   . Birth defects Neg Hx   . Cancer Neg Hx   . COPD Neg Hx   . Depression Neg Hx   . Diabetes Neg Hx   . Early death Neg Hx   . Drug abuse Neg Hx   . Hearing loss Neg Hx   . Heart disease Neg Hx   . Hyperlipidemia Neg Hx   . Hypertension Neg Hx   . Kidney disease Neg Hx   . Learning disabilities Neg Hx   . Mental illness Neg Hx   . Mental retardation Neg Hx   . Miscarriages / Stillbirths Neg Hx   . Stroke Neg Hx   . Vision loss Neg Hx     Social History History  Substance Use Topics  . Smoking status: Never Smoker   . Smokeless tobacco: Never Used  . Alcohol Use: No    No Known Allergies  Current Outpatient Prescriptions  Medication Sig Dispense Refill  . cefTRIAXone (ROCEPHIN) 250 MG injection Inject 250 mg into the muscle once.  FOR IM use in LARGE MUSCLE MASS 1 each 0  . acyclovir ointment (ZOVIRAX) 5 % Apply 1 application topically every 6 (six) hours as needed. (Patient not taking: Reported on 12/08/2014) 15 g 0  . azithromycin (ZITHROMAX) 250 MG tablet Take 4 tablets (1,000 mg total) by mouth once. (Patient not taking: Reported on 06/17/2015) 4 tablet 0  . medroxyPROGESTERone (DEPO-PROVERA) 150 MG/ML injection Inject 1 mL (150 mg total) into the muscle every 3 (three) months. (Patient not taking: Reported on 06/17/2015) 1 mL 3  . metroNIDAZOLE (FLAGYL) 500 MG tablet Take  1 tablet (500 mg total) by mouth 2 (two) times daily. (Patient not taking: Reported on 06/17/2015) 14 tablet 2  . Prenat-FeCbn-FeAspGl-FA-Omega (OB COMPLETE PETITE) 35-5-1-200 MG CAPS Take 1 capsule by mouth daily before breakfast. 90 capsule 3  . valACYclovir (VALTREX) 1000 MG tablet Take 1,000 mg by mouth 2 (two) times daily as needed (for outbreaks).     No current facility-administered medications for this visit.    Review of Systems Review of Systems Constitutional: positive for fatigue, breast tenderness and nausea Respiratory: negative for cough and wheezing Cardiovascular: negative for chest pain, fatigue and palpitations Gastrointestinal: negative for abdominal pain and change in bowel habits Genitourinary:negative Integument/breast: negative for nipple discharge Musculoskeletal:negative for myalgias Neurological: negative for gait problems and tremors Behavioral/Psych: negative for abusive relationship, depression Endocrine: negative for temperature intolerance     Blood pressure 110/76, pulse 87, temperature 97.7 F (36.5 C), height 5\' 10"  (1.778 m), weight 145 lb 3.2 oz (65.862 kg).  Physical Exam Physical Exam           General:  Alert and no distress Abdomen:  normal findings: no organomegaly, soft,  non-tender and no hernia  Pelvis:  External genitalia: normal general appearance Urinary system: urethral meatus normal and bladder without fullness, nontender Vaginal: normal without tenderness, induration or masses Cervix: normal appearance Adnexa: normal bimanual exam Uterus: anteverted and non-tender, normal size      Data Reviewed Labs  Assessment     Positive GC exposure, treated.   Early pregnancy     Plan    GC / CT cultures and wet prep done Quantitative beta HCG drawn.  Repeat in 48 hours.   Orders Placed This Encounter  Procedures  . SureSwab, Vaginosis/Vaginitis Plus  . hCG, quantitative, pregnancy  . B-HCG Quant    Standing Status: Future      Number of Occurrences:      Standing Expiration Date: 06/16/2016  . POCT urine pregnancy   Meds ordered this encounter  Medications  . Prenat-FeCbn-FeAspGl-FA-Omega (OB COMPLETE PETITE) 35-5-1-200 MG CAPS    Sig: Take 1 capsule by mouth daily before breakfast.    Dispense:  90 capsule    Refill:  3

## 2015-06-18 LAB — HCG, QUANTITATIVE, PREGNANCY: HCG, BETA CHAIN, QUANT, S: 41614.9 m[IU]/mL

## 2015-06-19 ENCOUNTER — Other Ambulatory Visit: Payer: Medicaid Other

## 2015-06-19 ENCOUNTER — Telehealth: Payer: Self-pay | Admitting: *Deleted

## 2015-06-19 NOTE — Telephone Encounter (Signed)
Patient contacted the office for las results. Attempted to contact the office and left message for patient to call the office.

## 2015-06-20 ENCOUNTER — Other Ambulatory Visit: Payer: Self-pay | Admitting: Obstetrics

## 2015-06-20 DIAGNOSIS — B3731 Acute candidiasis of vulva and vagina: Secondary | ICD-10-CM

## 2015-06-20 DIAGNOSIS — B9689 Other specified bacterial agents as the cause of diseases classified elsewhere: Secondary | ICD-10-CM

## 2015-06-20 DIAGNOSIS — N760 Acute vaginitis: Principal | ICD-10-CM

## 2015-06-20 DIAGNOSIS — B373 Candidiasis of vulva and vagina: Secondary | ICD-10-CM

## 2015-06-20 LAB — SURESWAB, VAGINOSIS/VAGINITIS PLUS
ATOPOBIUM VAGINAE: NOT DETECTED Log (cells/mL)
C. GLABRATA, DNA: DETECTED — AB
C. albicans, DNA: DETECTED — AB
C. parapsilosis, DNA: NOT DETECTED
C. trachomatis RNA, TMA: NOT DETECTED
C. tropicalis, DNA: NOT DETECTED
Gardnerella vaginalis: 6.2 Log (cells/mL)
LACTOBACILLUS SPECIES: NOT DETECTED Log (cells/mL)
MEGASPHAERA SPECIES: NOT DETECTED Log (cells/mL)
N. gonorrhoeae RNA, TMA: NOT DETECTED
T. vaginalis RNA, QL TMA: NOT DETECTED

## 2015-06-20 MED ORDER — METRONIDAZOLE 0.75 % VA GEL: 1.0000 | Freq: Two times a day (BID) | VAGINAL | Status: DC

## 2015-06-20 MED ORDER — FLUCONAZOLE 150 MG PO TABS: 150.0000 mg | ORAL_TABLET | Freq: Once | ORAL | Status: DC

## 2015-07-03 ENCOUNTER — Encounter: Payer: Self-pay | Admitting: *Deleted

## 2015-07-19 ENCOUNTER — Encounter (HOSPITAL_COMMUNITY): Payer: Self-pay | Admitting: Emergency Medicine

## 2015-07-19 ENCOUNTER — Emergency Department (HOSPITAL_COMMUNITY)
Admission: EM | Admit: 2015-07-19 | Discharge: 2015-07-19 | Discharge status: Against Medical Advice | Payer: Medicaid Other | Attending: Emergency Medicine | Admitting: Emergency Medicine

## 2015-07-19 DIAGNOSIS — O98319 Other infections with a predominantly sexual mode of transmission complicating pregnancy, unspecified trimester: Secondary | ICD-10-CM | POA: Diagnosis present

## 2015-07-19 DIAGNOSIS — Z3A00 Weeks of gestation of pregnancy not specified: Secondary | ICD-10-CM | POA: Diagnosis not present

## 2015-07-19 DIAGNOSIS — A64 Unspecified sexually transmitted disease: Secondary | ICD-10-CM | POA: Insufficient documentation

## 2015-07-19 NOTE — ED Notes (Signed)
Pt not answering call in waiting room 

## 2015-07-19 NOTE — ED Notes (Signed)
Pt complaining of pelvic cramping, itching/burning/discharge from vaginal area. Believes she may have chlamydiae again. Pt is also pregnant and asking for ultrasound.

## 2015-07-22 ENCOUNTER — Other Ambulatory Visit: Payer: Medicaid Other

## 2015-07-23 ENCOUNTER — Inpatient Hospital Stay (HOSPITAL_COMMUNITY)
Admission: AD | Admit: 2015-07-23 | Discharge: 2015-07-23 | Disposition: A | Discharge status: Home / Self Care | Payer: Medicaid Other | Source: Ambulatory Visit | Attending: Obstetrics & Gynecology | Admitting: Obstetrics & Gynecology

## 2015-07-23 ENCOUNTER — Encounter (HOSPITAL_COMMUNITY): Payer: Self-pay | Admitting: *Deleted

## 2015-07-23 DIAGNOSIS — O98811 Other maternal infectious and parasitic diseases complicating pregnancy, first trimester: Secondary | ICD-10-CM | POA: Diagnosis not present

## 2015-07-23 DIAGNOSIS — M545 Low back pain, unspecified: Secondary | ICD-10-CM

## 2015-07-23 DIAGNOSIS — O2691 Pregnancy related conditions, unspecified, first trimester: Secondary | ICD-10-CM

## 2015-07-23 DIAGNOSIS — R109 Unspecified abdominal pain: Secondary | ICD-10-CM | POA: Diagnosis present

## 2015-07-23 DIAGNOSIS — Z3A10 10 weeks gestation of pregnancy: Secondary | ICD-10-CM | POA: Insufficient documentation

## 2015-07-23 DIAGNOSIS — O26891 Other specified pregnancy related conditions, first trimester: Secondary | ICD-10-CM

## 2015-07-23 DIAGNOSIS — B3731 Acute candidiasis of vulva and vagina: Secondary | ICD-10-CM

## 2015-07-23 DIAGNOSIS — B373 Candidiasis of vulva and vagina: Secondary | ICD-10-CM | POA: Insufficient documentation

## 2015-07-23 LAB — URINALYSIS, ROUTINE W REFLEX MICROSCOPIC
Bilirubin Urine: NEGATIVE
GLUCOSE, UA: NEGATIVE mg/dL
Hgb urine dipstick: NEGATIVE
Ketones, ur: NEGATIVE mg/dL
Leukocytes, UA: NEGATIVE
Nitrite: NEGATIVE
PH: 7 (ref 5.0–8.0)
Protein, ur: NEGATIVE mg/dL
Specific Gravity, Urine: 1.02 (ref 1.005–1.030)
Urobilinogen, UA: 0.2 mg/dL (ref 0.0–1.0)

## 2015-07-23 LAB — WET PREP, GENITAL
Clue Cells Wet Prep HPF POC: NONE SEEN
TRICH WET PREP: NONE SEEN
Yeast Wet Prep HPF POC: NONE SEEN

## 2015-07-23 MED ORDER — TERCONAZOLE 0.8 % VA CREA: 1.0000 | TOPICAL_CREAM | Freq: Every day | VAGINAL | Status: DC

## 2015-07-23 NOTE — MAU Provider Note (Signed)
History     CSN: [id number]  Arrival date and time: 07/23/15 1316   First Provider Initiated Contact with Patient 07/23/15 1540         Chief Complaint  Patient presents with  . Abdominal Pain  . Back Pain   HPI  Carrie Nielsen is a 24 y.o. G3P1001 at [redacted]w[redacted]d who presents with abdominal cramping & low back pain.  Abdominal pain: x 2 days. Rates 6/10 & describes as cramping. Pain is worse with walking and standing.  Low back pain x 2 days. Rates as 6/10 & describes as sharp. Worse with standing.  Has been taking tylenol for pain with relief. Denies vaginal bleeding.  Denies n/v/d/constipation. Reports thick white vaginal discharge & vaginal irritation x 2 days.    Patient previously went to [facility] at beginning of pregnancy. Does not want to go back; looking for new provider.   Past Medical History  Diagnosis Date  . History of cold sores   . Medical history non-contributory     Past Surgical History  Procedure Laterality Date  . Wisdom tooth extraction      Family History  Problem Relation Age of Onset  . Alcohol abuse Neg Hx   . Arthritis Neg Hx   . Asthma Neg Hx   . Birth defects Neg Hx   . Cancer Neg Hx   . COPD Neg Hx   . Depression Neg Hx   . Diabetes Neg Hx   . Early death Neg Hx   . Drug abuse Neg Hx   . Hearing loss Neg Hx   . Heart disease Neg Hx   . Hyperlipidemia Neg Hx   . Hypertension Neg Hx   . Kidney disease Neg Hx   . Learning disabilities Neg Hx   . Mental illness Neg Hx   . Mental retardation Neg Hx   . Miscarriages / Stillbirths Neg Hx   . Stroke Neg Hx   . Vision loss Neg Hx     Social History  Substance Use Topics  . Smoking status: Never Smoker   . Smokeless tobacco: Never Used  . Alcohol Use: No    Allergies: No Known Allergies  Prescriptions prior to admission  Medication Sig Dispense Refill Last Dose  . acyclovir ointment (ZOVIRAX) 5 % Apply 1 application topically every 6 (six) hours as needed. (Patient not taking:  Reported on 12/08/2014) 15 g 0 Not Taking  . azithromycin (ZITHROMAX) 250 MG tablet Take 4 tablets (1,000 mg total) by mouth once. (Patient not taking: Reported on 06/17/2015) 4 tablet 0 Not Taking  . cefTRIAXone (ROCEPHIN) 250 MG injection Inject 250 mg into the muscle once.  FOR IM use in LARGE MUSCLE MASS 1 each 0 Taking  . fluconazole (DIFLUCAN) 150 MG tablet Take 1 tablet (150 mg total) by mouth once. 1 tablet 2   . medroxyPROGESTERone (DEPO-PROVERA) 150 MG/ML injection Inject 1 mL (150 mg total) into the muscle every 3 (three) months. (Patient not taking: Reported on 06/17/2015) 1 mL 3 Not Taking  . metroNIDAZOLE (FLAGYL) 500 MG tablet Take 1 tablet (500 mg total) by mouth 2 (two) times daily. (Patient not taking: Reported on 06/17/2015) 14 tablet 2 Not Taking  . metroNIDAZOLE (METROGEL VAGINAL) 0.75 % vaginal gel Place 1 Applicatorful vaginally 2 (two) times daily. 70 g 2   . Prenat-FeCbn-FeAspGl-FA-Omega (OB COMPLETE PETITE) 35-5-1-200 MG CAPS Take 1 capsule by mouth daily before breakfast. 90 capsule 3   . valACYclovir (VALTREX) 1000 MG tablet Take 1,000  mg by mouth 2 (two) times daily as needed (for outbreaks).   Not Taking    Review of Systems  Constitutional: Negative.  Negative for fever.  Gastrointestinal: Positive for abdominal pain. Negative for nausea, vomiting, diarrhea and constipation.  Genitourinary: Negative for dysuria.       + vaginal discharge & irritation Denies vaginal bleeding  Musculoskeletal: Positive for back pain.   Physical Exam   Blood pressure 120/69, pulse 92, temperature 98.1 F (36.7 C), temperature source Oral, resp. rate 16, height  (1.778 m), weight 66.316 kg (146 lb 3.2 oz), last menstrual period 04/09/2015, unknown if currently breastfeeding.  Physical Exam  Nursing note and vitals reviewed. Constitutional: She is oriented to person, place, and time. She appears well-developed and well-nourished. No distress.  HENT:  Head: Normocephalic and  atraumatic.  Eyes: Conjunctivae are normal. Right eye exhibits no discharge. Left eye exhibits no discharge. No scleral icterus.  Neck: Normal range of motion.  Cardiovascular: Normal rate, regular rhythm and normal heart sounds.   No murmur heard. Respiratory: Effort normal and breath sounds normal. No respiratory distress. She has no wheezes.  GI: Soft. Bowel sounds are normal. She exhibits no distension. There is tenderness. There is no rebound.  Genitourinary: Cervix exhibits discharge. Cervix exhibits no motion tenderness and no friability. There is erythema in the vagina. Vaginal discharge found.  Small amount of thick clumpy white discharge Bilateral labial erythema  Neurological: She is alert and oriented to person, place, and time.  Skin: Skin is warm and dry. She is not diaphoretic.  Psychiatric: She has a normal mood and affect. Her behavior is normal. Judgment and thought content normal.    MAU Course  Procedures Results for orders placed or performed during the hospital encounter of 07/23/15 (from the past 24 hour(s))  Urinalysis, Routine w reflex microscopic (not at [facility])     Status: None   Collection Time: 07/23/15  1:35 PM  Result Value Ref Range   Color, Urine YELLOW YELLOW   APPearance CLEAR CLEAR   Specific Gravity, Urine 1.020 1.005 - 1.030   pH 7.0 5.0 - 8.0   Glucose, UA NEGATIVE NEGATIVE mg/dL   Hgb urine dipstick NEGATIVE NEGATIVE   Bilirubin Urine NEGATIVE NEGATIVE   Ketones, ur NEGATIVE NEGATIVE mg/dL   Protein, ur NEGATIVE NEGATIVE mg/dL   Urobilinogen, UA 0.2 0.0 - 1.0 mg/dL   Nitrite NEGATIVE NEGATIVE   Leukocytes, UA NEGATIVE NEGATIVE  Wet prep, genital     Status: Abnormal   Collection Time: 07/23/15  4:00 PM  Result Value Ref Range   Yeast Wet Prep HPF POC NONE SEEN NONE SEEN   Trich, Wet Prep NONE SEEN NONE SEEN   Clue Cells Wet Prep HPF POC NONE SEEN NONE SEEN   WBC, Wet Prep HPF POC MODERATE (A) NONE SEEN    MDM Will tx for yeast infection  based on clinical evaluation GC/CT & wet prep FHT 160 by doppler  Assessment and Plan  A: 1. Low back pain during pregnancy in first trimester   2. Vaginal yeast infection    P: Discharge home Rx Terazol 3 List of ob/gyn providers given GC/CT pending  Judeth Horn, NP  07/23/2015, 3:39 PM

## 2015-07-23 NOTE — Discharge Instructions (Signed)
Back Pain in Pregnancy °Back pain during pregnancy is common. It happens in about half of all pregnancies. It is important for you and your baby that you remain active during your pregnancy. If you feel that back pain is not allowing you to remain active or sleep well, it is time to see your caregiver. Back pain may be caused by several factors related to changes during your pregnancy. Fortunately, unless you had trouble with your back before your pregnancy, the pain is likely to get better after you deliver. °Low back pain usually occurs between the fifth and seventh months of pregnancy. It can, however, happen in the first couple months. Factors that increase the risk of back problems include:  °· Previous back problems. °· Injury to your back. °· Having twins or multiple births. °· A chronic cough. °· Stress. °· Job-related repetitive motions. °· Muscle or spinal disease in the back. °· Family history of back problems, ruptured (herniated) discs, or osteoporosis. °· Depression, anxiety, and panic attacks. °CAUSES  °· When you are pregnant, your body produces a hormone called relaxin. This hormone makes the ligaments connecting the low back and pubic bones more flexible. This flexibility allows the baby to be delivered more easily. When your ligaments are loose, your muscles need to work harder to support your back. Soreness in your back can come from tired muscles. Soreness can also come from back tissues that are irritated since they are receiving less support. °· As the baby grows, it puts pressure on the nerves and blood vessels in your pelvis. This can cause back pain. °· As the baby grows and gets heavier during pregnancy, the uterus pushes the stomach muscles forward and changes your center of gravity. This makes your back muscles work harder to maintain good posture. °SYMPTOMS  °Lumbar pain during pregnancy °Lumbar pain during pregnancy usually occurs at or above the waist in the center of the back. There  may be pain and numbness that radiates into your leg or foot. This is similar to low back pain experienced by non-pregnant women. It usually increases with sitting for long periods of time, standing, or repetitive lifting. Tenderness may also be present in the muscles along your upper back. °Posterior pelvic pain during pregnancy °Pain in the back of the pelvis is more common than lumbar pain in pregnancy. It is a deep pain felt in your side at the waistline, or across the tailbone (sacrum), or in both places. You may have pain on one or both sides. This pain can also go into the buttocks and backs of the upper thighs. Pubic and groin pain may also be present. The pain does not quickly resolve with rest, and morning stiffness may also be present. °Pelvic pain during pregnancy can be brought on by most activities. A high level of fitness before and during pregnancy may or may not prevent this problem. Labor pain is usually 1 to 2 minutes apart, lasts for about 1 minute, and involves a bearing down feeling or pressure in your pelvis. However, if you are at term with the pregnancy, constant low back pain can be the beginning of early labor, and you should be aware of this. °DIAGNOSIS  °X-rays of the back should not be done during the first 12 to 14 weeks of the pregnancy and only when absolutely necessary during the rest of the pregnancy. MRIs do not give off radiation and are safe during pregnancy. MRIs also should only be done when absolutely necessary. °HOME CARE INSTRUCTIONS °· Exercise   as directed by your caregiver. Exercise is the most effective way to prevent or manage back pain. If you have a back problem, it is especially important to avoid sports that require sudden body movements. Swimming and walking are great activities. °· Do not stand in one place for long periods of time. °· Do not wear high heels. °· Sit in chairs with good posture. Use a pillow on your lower back if necessary. Make sure your head  rests over your shoulders and is not hanging forward. °· Try sleeping on your side, preferably the left side, with a pillow or two between your legs. If you are sore after a night's rest, your bed may be too soft. Try placing a board between your mattress and box spring. °· Listen to your body when lifting. If you are experiencing pain, ask for help or try bending your knees more so you can use your leg muscles rather than your back muscles. Squat down when picking up something from the floor. Do not bend over. °· Eat a healthy diet. Try to gain weight within your caregiver's recommendations. °· Use heat or cold packs 3 to 4 times a day for 15 minutes to help with the pain. °· Only take over-the-counter or prescription medicines for pain, discomfort, or fever as directed by your caregiver. °Sudden (acute) back pain °· Use bed rest for only the most extreme, acute episodes of back pain. Prolonged bed rest over 48 hours will aggravate your condition. °· Ice is very effective for acute conditions. °¨ Put ice in a plastic bag. °¨ Place a towel between your skin and the bag. °¨ Leave the ice on for 10 to 20 minutes every 2 hours, or as needed. °· Using heat packs for 30 minutes prior to activities is also helpful. °Continued back pain °See your caregiver if you have continued problems. Your caregiver can help or refer you for appropriate physical therapy. With conditioning, most back problems can be avoided. Sometimes, a more serious issue may be the cause of back pain. You should be seen right away if new problems seem to be developing. Your caregiver may recommend: °· A maternity girdle. °· An elastic sling. °· A back brace. °· A massage therapist or acupuncture. °SEEK MEDICAL CARE IF:  °· You are not able to do most of your daily activities, even when taking the pain medicine you were given. °· You need a referral to a physical therapist or chiropractor. °· You want to try acupuncture. °SEEK IMMEDIATE MEDICAL CARE  IF: °· You develop numbness, tingling, weakness, or problems with the use of your arms or legs. °· You develop severe back pain that is no longer relieved with medicines. °· You have a sudden change in bowel or bladder control. °· You have increasing pain in other areas of the body. °· You develop shortness of breath, dizziness, or fainting. °· You develop nausea, vomiting, or sweating. °· You have back pain which is similar to labor pains. °· You have back pain along with your water breaking or vaginal bleeding. °· You have back pain or numbness that travels down your leg. °· Your back pain developed after you fell. °· You develop pain on one side of your back. You may have a kidney stone. °· You see blood in your urine. You may have a bladder infection or kidney stone. °· You have back pain with blisters. You may have shingles. °Back pain is fairly common during pregnancy but should not be accepted as just part of   the process. Back pain should always be treated as soon as possible. This will make your pregnancy as pleasant as possible. Document Released: 02/03/2006 Document Revised: 01/18/2012 Document Reviewed: 03/17/2011 [facility] Patient Information 2015 [place], [place]. This information is not intended to replace advice given to you by your health care provider. Make sure you discuss any questions you have with your health care provider.    Safe Medications in Pregnancy   Acne: Benzoyl Peroxide Salicylic Acid  Backache/Headache: Tylenol: 2 regular strength every 4 hours OR              2 Extra strength every 6 hours  Colds/Coughs/Allergies: Benadryl (alcohol free) 25 mg every 6 hours as needed Breath right strips Claritin Cepacol throat lozenges Chloraseptic throat spray Cold-Eeze- up to three times per day Cough drops, alcohol free Flonase (by prescription only) Guaifenesin Mucinex Robitussin DM (plain only, alcohol free) Saline nasal spray/drops Sudafed (pseudoephedrine) & Actifed  ** use only after [redacted] weeks gestation and if you do not have high blood pressure Tylenol Vicks Vaporub Zinc lozenges Zyrtec   Constipation: Colace Ducolax suppositories Fleet enema Glycerin suppositories Metamucil Milk of magnesia Miralax Senokot Smooth move tea  Diarrhea: Kaopectate Imodium A-D  *NO pepto Bismol  Hemorrhoids: Anusol Anusol HC Preparation H Tucks  Indigestion: Tums Maalox Mylanta Zantac  Pepcid  Insomnia: Benadryl (alcohol free)  every 6 hours as needed Tylenol PM Unisom, no Gelcaps  Leg Cramps: Tums MagGel  Nausea/Vomiting:  Bonine Dramamine Emetrol Ginger extract Sea bands Meclizine  Nausea medication to take during pregnancy:  Unisom (doxylamine succinate 25 mg tablets) Take one tablet daily at bedtime. If symptoms are not adequately controlled, the dose can be increased to a maximum recommended dose of two tablets daily (1/2 tablet in the morning, 1/2 tablet mid-afternoon and one at bedtime). Vitamin B6  tablets. Take one tablet twice a day (up to 200 mg per day).  Skin Rashes: Aveeno products Benadryl cream or  every 6 hours as needed Calamine Lotion 1% cortisone cream  Yeast infection: Gyne-lotrimin 7 Monistat 7   **If taking multiple medications, please check labels to avoid duplicating the same active ingredients **take medication as directed on the label ** Do not exceed 4000 mg of tylenol in 24 hours **Do not take medications that contain aspirin or ibuprofen     [facility]   Francoise Ceo      Phone: [phone]  [facility]     Phone: [phone]  [facility] [place]  Phone: [phone]  [facility] [place]  Phone: [phone]  [facility]    Phone: [phone]   [facility] [place])    Phone: [phone]  [facility]    Phone:  [phone]  [facility]    Phone: [phone]  [facility]    Phone: [phone]  [facility] Phone: [phone]   [facility]  Phone: [phone]  [facility]    Phone: [phone]  [facility] [place]   Phone: [phone]  [facility]      Phone: [phone]  [facility]    Phone: [phone]  [facility]     Phone: [phone]

## 2015-07-23 NOTE — MAU Note (Signed)
Pt C/O lower back pain, also abdominal pain for the past 2 days.  Denies bleeding.  Pos UPT @ [facility] office, wants to switch doctors.

## 2015-07-24 ENCOUNTER — Encounter (HOSPITAL_COMMUNITY): Payer: Self-pay | Admitting: *Deleted

## 2015-07-24 LAB — GC/CHLAMYDIA PROBE AMP (~~LOC~~) NOT AT ARMC
Chlamydia: NEGATIVE
Neisseria Gonorrhea: POSITIVE — AB

## 2015-07-31 ENCOUNTER — Telehealth: Payer: Self-pay | Admitting: *Deleted

## 2015-07-31 NOTE — Telephone Encounter (Signed)
Patient left message requesting vitamin check? 10:46 Number is not the number in the patient's chart and the message is Spanish. LM for patient to call back.

## 2015-08-02 ENCOUNTER — Ambulatory Visit: Payer: Medicaid Other | Admitting: Obstetrics

## 2015-08-14 ENCOUNTER — Telehealth: Payer: Self-pay | Admitting: Obstetrics

## 2015-08-14 ENCOUNTER — Ambulatory Visit: Payer: Medicaid Other | Admitting: Obstetrics

## 2015-08-15 ENCOUNTER — Inpatient Hospital Stay (HOSPITAL_COMMUNITY)
Admission: AD | Admit: 2015-08-15 | Discharge: 2015-08-15 | Disposition: A | Discharge status: Home / Self Care | Payer: Medicaid Other | Source: Ambulatory Visit | Attending: Obstetrics | Admitting: Obstetrics

## 2015-08-15 ENCOUNTER — Encounter (HOSPITAL_COMMUNITY): Payer: Self-pay | Admitting: *Deleted

## 2015-08-15 ENCOUNTER — Telehealth: Payer: Self-pay | Admitting: *Deleted

## 2015-08-15 DIAGNOSIS — M549 Dorsalgia, unspecified: Secondary | ICD-10-CM | POA: Diagnosis not present

## 2015-08-15 DIAGNOSIS — O26892 Other specified pregnancy related conditions, second trimester: Secondary | ICD-10-CM

## 2015-08-15 DIAGNOSIS — Z3A14 14 weeks gestation of pregnancy: Secondary | ICD-10-CM | POA: Diagnosis not present

## 2015-08-15 DIAGNOSIS — M545 Low back pain: Secondary | ICD-10-CM | POA: Insufficient documentation

## 2015-08-15 DIAGNOSIS — O9989 Other specified diseases and conditions complicating pregnancy, childbirth and the puerperium: Secondary | ICD-10-CM

## 2015-08-15 DIAGNOSIS — M791 Myalgia: Secondary | ICD-10-CM | POA: Diagnosis not present

## 2015-08-15 DIAGNOSIS — M7918 Myalgia, other site: Secondary | ICD-10-CM

## 2015-08-15 LAB — URINALYSIS, ROUTINE W REFLEX MICROSCOPIC
BILIRUBIN URINE: NEGATIVE
Glucose, UA: NEGATIVE mg/dL
Hgb urine dipstick: NEGATIVE
KETONES UR: 15 mg/dL — AB
LEUKOCYTES UA: NEGATIVE
NITRITE: NEGATIVE
PH: 6 (ref 5.0–8.0)
PROTEIN: NEGATIVE mg/dL
Specific Gravity, Urine: >1.03 — ABNORMAL HIGH (ref 1.005–1.030)
UROBILINOGEN UA: 0.2 mg/dL (ref 0.0–1.0)

## 2015-08-15 MED ORDER — OXYCODONE-ACETAMINOPHEN 5-325 MG PO TABS: 2.0000 | ORAL_TABLET | Freq: Once | ORAL | Status: DC

## 2015-08-15 MED ORDER — CYCLOBENZAPRINE HCL 10 MG PO TABS
10.0000 mg | ORAL_TABLET | Freq: Once | ORAL | Status: AC
  Administered 2015-08-15: 10 mg via ORAL
  Filled 2015-08-15: qty 1

## 2015-08-15 MED ORDER — OXYCODONE-ACETAMINOPHEN 5-325 MG PO TABS
2.0000 | ORAL_TABLET | Freq: Once | ORAL | Status: AC
  Administered 2015-08-15: 2 via ORAL
  Filled 2015-08-15: qty 2

## 2015-08-15 NOTE — Telephone Encounter (Signed)
Close encounter 

## 2015-08-15 NOTE — Discharge Instructions (Signed)
Back Pain in Pregnancy °Back pain during pregnancy is common. It happens in about half of all pregnancies. It is important for you and your baby that you remain active during your pregnancy. If you feel that back pain is not allowing you to remain active or sleep well, it is time to see your caregiver. Back pain may be caused by several factors related to changes during your pregnancy. Fortunately, unless you had trouble with your back before your pregnancy, the pain is likely to get better after you deliver. °Low back pain usually occurs between the fifth and seventh months of pregnancy. It can, however, happen in the first couple months. Factors that increase the risk of back problems include:  °· Previous back problems. °· Injury to your back. °· Having twins or multiple births. °· A chronic cough. °· Stress. °· Job-related repetitive motions. °· Muscle or spinal disease in the back. °· Family history of back problems, ruptured (herniated) discs, or osteoporosis. °· Depression, anxiety, and panic attacks. °CAUSES  °· When you are pregnant, your body produces a hormone called relaxin. This hormone makes the ligaments connecting the low back and pubic bones more flexible. This flexibility allows the baby to be delivered more easily. When your ligaments are loose, your muscles need to work harder to support your back. Soreness in your back can come from tired muscles. Soreness can also come from back tissues that are irritated since they are receiving less support. °· As the baby grows, it puts pressure on the nerves and blood vessels in your pelvis. This can cause back pain. °· As the baby grows and gets heavier during pregnancy, the uterus pushes the stomach muscles forward and changes your center of gravity. This makes your back muscles work harder to maintain good posture. °SYMPTOMS  °Lumbar pain during pregnancy °Lumbar pain during pregnancy usually occurs at or above the waist in the center of the back. There  may be pain and numbness that radiates into your leg or foot. This is similar to low back pain experienced by non-pregnant women. It usually increases with sitting for long periods of time, standing, or repetitive lifting. Tenderness may also be present in the muscles along your upper back. °Posterior pelvic pain during pregnancy °Pain in the back of the pelvis is more common than lumbar pain in pregnancy. It is a deep pain felt in your side at the waistline, or across the tailbone (sacrum), or in both places. You may have pain on one or both sides. This pain can also go into the buttocks and backs of the upper thighs. Pubic and groin pain may also be present. The pain does not quickly resolve with rest, and morning stiffness may also be present. °Pelvic pain during pregnancy can be brought on by most activities. A high level of fitness before and during pregnancy may or may not prevent this problem. Labor pain is usually 1 to 2 minutes apart, lasts for about 1 minute, and involves a bearing down feeling or pressure in your pelvis. However, if you are at term with the pregnancy, constant low back pain can be the beginning of early labor, and you should be aware of this. °DIAGNOSIS  °X-rays of the back should not be done during the first 12 to 14 weeks of the pregnancy and only when absolutely necessary during the rest of the pregnancy. MRIs do not give off radiation and are safe during pregnancy. MRIs also should only be done when absolutely necessary. °HOME CARE INSTRUCTIONS °· Exercise   as directed by your caregiver. Exercise is the most effective way to prevent or manage back pain. If you have a back problem, it is especially important to avoid sports that require sudden body movements. Swimming and walking are great activities. °· Do not stand in one place for long periods of time. °· Do not wear high heels. °· Sit in chairs with good posture. Use a pillow on your lower back if necessary. Make sure your head  rests over your shoulders and is not hanging forward. °· Try sleeping on your side, preferably the left side, with a pillow or two between your legs. If you are sore after a night's rest, your bed may be too soft. Try placing a board between your mattress and box spring. °· Listen to your body when lifting. If you are experiencing pain, ask for help or try bending your knees more so you can use your leg muscles rather than your back muscles. Squat down when picking up something from the floor. Do not bend over. °· Eat a healthy diet. Try to gain weight within your caregiver's recommendations. °· Use heat or cold packs 3 to 4 times a day for 15 minutes to help with the pain. °· Only take over-the-counter or prescription medicines for pain, discomfort, or fever as directed by your caregiver. °Sudden (acute) back pain °· Use bed rest for only the most extreme, acute episodes of back pain. Prolonged bed rest over 48 hours will aggravate your condition. °· Ice is very effective for acute conditions. °¨ Put ice in a plastic bag. °¨ Place a towel between your skin and the bag. °¨ Leave the ice on for 10 to 20 minutes every 2 hours, or as needed. °· Using heat packs for 30 minutes prior to activities is also helpful. °Continued back pain °See your caregiver if you have continued problems. Your caregiver can help or refer you for appropriate physical therapy. With conditioning, most back problems can be avoided. Sometimes, a more serious issue may be the cause of back pain. You should be seen right away if new problems seem to be developing. Your caregiver may recommend: °· A maternity girdle. °· An elastic sling. °· A back brace. °· A massage therapist or acupuncture. °SEEK MEDICAL CARE IF:  °· You are not able to do most of your daily activities, even when taking the pain medicine you were given. °· You need a referral to a physical therapist or chiropractor. °· You want to try acupuncture. °SEEK IMMEDIATE MEDICAL CARE  IF: °· You develop numbness, tingling, weakness, or problems with the use of your arms or legs. °· You develop severe back pain that is no longer relieved with medicines. °· You have a sudden change in bowel or bladder control. °· You have increasing pain in other areas of the body. °· You develop shortness of breath, dizziness, or fainting. °· You develop nausea, vomiting, or sweating. °· You have back pain which is similar to labor pains. °· You have back pain along with your water breaking or vaginal bleeding. °· You have back pain or numbness that travels down your leg. °· Your back pain developed after you fell. °· You develop pain on one side of your back. You may have a kidney stone. °· You see blood in your urine. You may have a bladder infection or kidney stone. °· You have back pain with blisters. You may have shingles. °Back pain is fairly common during pregnancy but should not be accepted as just part of   the process. Back pain should always be treated as soon as possible. This will make your pregnancy as pleasant as possible. °  °This information is not intended to replace advice given to you by your health care provider. Make sure you discuss any questions you have with your health care provider. °  °Document Released: 02/03/2006 Document Revised: 01/18/2012 Document Reviewed: 03/17/2011 °Elsevier Interactive Patient Education ©2016 [employer] °Musculoskeletal Pain °Musculoskeletal pain is muscle and boney aches and pains. These pains can occur in any part of the body. Your caregiver may treat you without knowing the cause of the pain. They may treat you if blood or urine tests, X-rays, and other tests were normal.  °CAUSES °There is often not a definite cause or reason for these pains. These pains may be caused by a type of germ (virus). The discomfort may also come from overuse. Overuse includes working out too hard when your body is not fit. Boney aches also come from weather changes. Bone is  sensitive to atmospheric pressure changes. °HOME CARE INSTRUCTIONS  °· Ask when your test results will be ready. Make sure you get your test results. °· Only take over-the-counter or prescription medicines for pain, discomfort, or fever as directed by your caregiver. If you were given medications for your condition, do not drive, operate machinery or power tools, or sign legal documents for 24 hours. Do not drink alcohol. Do not take sleeping pills or other medications that may interfere with treatment. °· Continue all activities unless the activities cause more pain. When the pain lessens, slowly resume normal activities. Gradually increase the intensity and duration of the activities or exercise. °· During periods of severe pain, bed rest may be helpful. Lay or sit in any position that is comfortable. °· Putting ice on the injured area. °¨ Put ice in a bag. °¨ Place a towel between your skin and the bag. °¨ Leave the ice on for 15 to 20 minutes, 3 to 4 times a day. °· Follow up with your caregiver for continued problems and no reason can be found for the pain. If the pain becomes worse or does not go away, it may be necessary to repeat tests or do additional testing. Your caregiver may need to look further for a possible cause. °SEEK IMMEDIATE MEDICAL CARE IF: °· You have pain that is getting worse and is not relieved by medications. °· You develop chest pain that is associated with shortness or breath, sweating, feeling sick to your stomach (nauseous), or throw up (vomit). °· Your pain becomes localized to the abdomen. °· You develop any new symptoms that seem different or that concern you. °MAKE SURE YOU:  °· Understand these instructions. °· Will watch your condition. °· Will get help right away if you are not doing well or get worse. °  °This information is not intended to replace advice given to you by your health care provider. Make sure you discuss any questions you have with your health care provider. °    °Document Released: 10/26/2005 Document Revised: 01/18/2012 Document Reviewed: 06/30/2013 °Elsevier Interactive Patient Education ©2016 [employer]vier Inc. ° °

## 2015-08-15 NOTE — MAU Note (Signed)
Pt presents to MAU with complaints of pain in her lower back for about a week. Denies any vaginal bleeding or abnormal discharge.

## 2015-08-15 NOTE — Telephone Encounter (Signed)
Attempt to contact pt.  Number in chart has been disconnected.  Placed call to emergency contact-Grandmother.  LM on VM making her aware that we need to get in contact with pt. Ask her to have [facility] call our office.
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
## 2015-08-16 ENCOUNTER — Other Ambulatory Visit: Payer: Self-pay | Admitting: *Deleted

## 2015-08-16 DIAGNOSIS — A549 Gonococcal infection, unspecified: Secondary | ICD-10-CM

## 2015-08-16 MED ORDER — AZITHROMYCIN 250 MG PO TABS: 1000.0000 mg | ORAL_TABLET | Freq: Once | ORAL | Status: DC

## 2015-08-16 NOTE — Telephone Encounter (Signed)
Carrie Nielsen spoke to patient and she is aware of her test results. She has appointments next week.

## 2015-08-16 NOTE — Progress Notes (Signed)
Pt was tested Positive for GC at visit to [facility].  Pt was made aware of results today as we have been unable to get in contact with pt. Pt was also made aware of need for injection of Rocephin.  Discussed with pt her plan for care of pregnancy, pt states that she was thinking of changing doctors but she will stay with Dr Clearance Coots since he delivered her other child.  Pt was made aware that our office will be happy to have her as a pt but only if she wishes to continue care here.  Pt was made aware that once she has established care at an office she will most likely not be able to transfer elsewhere.  Pt states understanding and was transferred to [facility] for scheduling.

## 2015-08-19 ENCOUNTER — Encounter: Payer: Medicaid Other | Admitting: Obstetrics

## 2015-08-19 NOTE — Telephone Encounter (Signed)
Multiple attempted made to contact patient. Letter mailed to patient 07-03-15.

## 2015-08-22 ENCOUNTER — Encounter: Payer: Self-pay | Admitting: Certified Nurse Midwife

## 2015-08-22 ENCOUNTER — Ambulatory Visit (INDEPENDENT_AMBULATORY_CARE_PROVIDER_SITE_OTHER): Payer: Medicaid Other

## 2015-08-22 ENCOUNTER — Ambulatory Visit: Payer: Medicaid Other | Admitting: Obstetrics

## 2015-08-22 ENCOUNTER — Ambulatory Visit (INDEPENDENT_AMBULATORY_CARE_PROVIDER_SITE_OTHER): Payer: Medicaid Other | Admitting: Certified Nurse Midwife

## 2015-08-22 ENCOUNTER — Encounter: Payer: Medicaid Other | Admitting: Obstetrics

## 2015-08-22 VITALS — BP 104/72 | HR 90 | Temp 98.2°F | Wt 149.0 lb

## 2015-08-22 DIAGNOSIS — Z36 Encounter for antenatal screening of mother: Secondary | ICD-10-CM

## 2015-08-22 DIAGNOSIS — O9989 Other specified diseases and conditions complicating pregnancy, childbirth and the puerperium: Secondary | ICD-10-CM

## 2015-08-22 DIAGNOSIS — Z3482 Encounter for supervision of other normal pregnancy, second trimester: Secondary | ICD-10-CM

## 2015-08-22 DIAGNOSIS — R109 Unspecified abdominal pain: Secondary | ICD-10-CM

## 2015-08-22 DIAGNOSIS — O98219 Gonorrhea complicating pregnancy, unspecified trimester: Secondary | ICD-10-CM

## 2015-08-22 DIAGNOSIS — O26899 Other specified pregnancy related conditions, unspecified trimester: Secondary | ICD-10-CM

## 2015-08-22 DIAGNOSIS — Z3687 Encounter for antenatal screening for uncertain dates: Secondary | ICD-10-CM

## 2015-08-22 LAB — POCT URINALYSIS DIPSTICK
Bilirubin, UA: NEGATIVE
Blood, UA: NEGATIVE
Ketones, UA: NEGATIVE
Leukocytes, UA: NEGATIVE
Nitrite, UA: NEGATIVE
PROTEIN UA: NEGATIVE
Spec Grav, UA: 1.015
UROBILINOGEN UA: NEGATIVE
pH, UA: 6

## 2015-08-22 LAB — TSH: TSH: 0.365 u[IU]/mL (ref 0.350–4.500)

## 2015-08-22 MED ORDER — CEFTRIAXONE SODIUM 1 G IJ SOLR
250.0000 mg | Freq: Once | INTRAMUSCULAR | Status: AC
  Administered 2015-08-22: 250 mg via INTRAMUSCULAR

## 2015-08-22 NOTE — Progress Notes (Signed)
Pt was given Rocephin injection at today's visit.  Pt had +GC result. Pt tolerated injection well. Pt made aware of no sexual intercourse for 2 weeks from treatment. Pt made aware that partner to be treated as well.  Pt made aware of need for TOC in 2-3 months.  Pt made aware of health dept notification.

## 2015-08-22 NOTE — Progress Notes (Signed)
Patient ID: Carrie Nielsen, female   DOB: 07/28/1991, 24 y.o.   MRN: [id number]   Subjective:    Carrie PoliShequan Pierpoint is a 24 y.o. female being seen today for her obstetrical visit. She is at 3292w0d gestation. Patient reports: backache, no bleeding, no contractions, no cramping and no leaking. [facility]  Rocephin injection today in office.  Patient appears very happy.  Had miscarriage with the last pregnancy.  Scheduled for NOB visit next week.  Ultrasound in office today for dating, viability.    Problem List Items Addressed This Visit    None    Visit Diagnoses    Abdominal pain in pregnancy, antepartum    -  Primary    Relevant Orders    US OB Limited    Obstetric panel    HIV antibody    Hemoglobinopathy evaluation    Varicella zoster antibody, IgG    Vit D  25 hydroxy (rtn osteoporosis monitoring)    Culture, OB Urine    TSH    Unsure of LMP (last menstrual period) as reason for ultrasound scan        Relevant Orders    US OB Limited    Obstetric panel    HIV antibody    Hemoglobinopathy evaluation    Varicella zoster antibody, IgG    Vit D  25 hydroxy (rtn osteoporosis monitoring)    Culture, OB Urine    TSH    AFP, Quad Screen    Supervision of other normal pregnancy, antepartum, second trimester        Relevant Orders    Obstetric panel    HIV antibody    Hemoglobinopathy evaluation    Varicella zoster antibody, IgG    Vit D  25 hydroxy (rtn osteoporosis monitoring)    Culture, OB Urine    TSH    Gonorrhea affecting pregnancy        Relevant Medications    cefTRIAXone (ROCEPHIN) injection 250 mg (Completed)      Patient Active Problem List   Diagnosis Date Noted  . Chlamydia infection 06/16/2014  . 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 Esophageal reflux 02/08/2013    Objective:     BP 104/72 mmHg  Pulse 90  Temp(Src) 98.2 F (36.8 C)  Wt 149 lb (67.586 kg)  LMP 05/09/2015 Uterine Size: Below umbilicus, 17 cm   FHR by doppler:  160  Assessment:    Pregnancy @ 3992w0d  weeks Doing well   + GC cultures, treated today  Plan:    Problem list reviewed and updated. Labs reviewed. Follow up in 1 weeks for NOB visit. FIRST/CF mutation testing/NIPT/QUAD SCREEN/fragile X/Ashkenazi Jewish population testing/Spinal muscular atrophy discussed: ordered. Role of ultrasound in pregnancy discussed; fetal survey: requested. Amniocentesis discussed: not indicated. 50% of 15 minute visit spent on counseling and coordination of care.

## 2015-08-23 LAB — OBSTETRIC PANEL
Antibody Screen: NEGATIVE
Basophils Absolute: 0 10*3/uL (ref 0.0–0.1)
Basophils Relative: 0 % (ref 0–1)
EOS PCT: 2 % (ref 0–5)
Eosinophils Absolute: 0.2 10*3/uL (ref 0.0–0.7)
HCT: 37.9 % (ref 36.0–46.0)
Hemoglobin: 12.6 g/dL (ref 12.0–15.0)
Hepatitis B Surface Ag: NEGATIVE
LYMPHS ABS: 1.9 10*3/uL (ref 0.7–4.0)
LYMPHS PCT: 17 % (ref 12–46)
MCH: 29.3 pg (ref 26.0–34.0)
MCHC: 33.2 g/dL (ref 30.0–36.0)
MCV: 88.1 fL (ref 78.0–100.0)
MPV: 9.7 fL (ref 8.6–12.4)
Monocytes Absolute: 0.4 10*3/uL (ref 0.1–1.0)
Monocytes Relative: 4 % (ref 3–12)
NEUTROS PCT: 77 % (ref 43–77)
Neutro Abs: 8.4 10*3/uL — ABNORMAL HIGH (ref 1.7–7.7)
PLATELETS: 325 10*3/uL (ref 150–400)
RBC: 4.3 MIL/uL (ref 3.87–5.11)
RDW: 13.9 % (ref 11.5–15.5)
RH TYPE: POSITIVE
RUBELLA: 1.35 {index} — AB (ref <0.90)
WBC: 10.9 10*3/uL — ABNORMAL HIGH (ref 4.0–10.5)

## 2015-08-23 LAB — CULTURE, OB URINE

## 2015-08-23 LAB — AFP, QUAD SCREEN
AFP: 43.1 ng/mL
Age Alone: 1:1130 {titer}
Curr Gest Age: 16.4 wks.days
Down Syndrome Scr Risk Est: 1:456 {titer}
HCG, Total: 80.7 IU/mL
INH: 185.1 pg/mL
Interpretation-AFP: NEGATIVE
MOM FOR HCG: 2.07
MoM for AFP: 1.04
MoM for INH: 1.07
OPEN SPINA BIFIDA: NEGATIVE
TRI 18 SCR RISK EST: NEGATIVE
Trisomy 18 (Edward) Syndrome Interp.: 1:28500 {titer}
uE3 Mom: 0.59
uE3 Value: 0.56 ng/mL

## 2015-08-23 LAB — HIV ANTIBODY (ROUTINE TESTING W REFLEX): HIV: NONREACTIVE

## 2015-08-23 LAB — VARICELLA ZOSTER ANTIBODY, IGG: VARICELLA IGG: 1757 {index} — AB (ref <135.00)

## 2015-08-23 LAB — VITAMIN D 25 HYDROXY (VIT D DEFICIENCY, FRACTURES): Vit D, 25-Hydroxy: 21 ng/mL — ABNORMAL LOW (ref 30–100)

## 2015-08-23 NOTE — Addendum Note (Signed)
Addended by: Henriette CombsHATTON, Kenae Lindquist L on: 08/23/2015 02:24 PM   Modules accepted: Orders

## 2015-08-26 LAB — HEMOGLOBINOPATHY EVALUATION
HGB A2 QUANT: 3.3 % — AB (ref 2.2–3.2)
HGB F QUANT: 0.3 % (ref 0.0–2.0)
Hemoglobin Other: 0 %
Hgb A: 96.4 % — ABNORMAL LOW (ref 96.8–97.8)
Hgb S Quant: 0 %

## 2015-08-27 ENCOUNTER — Ambulatory Visit (INDEPENDENT_AMBULATORY_CARE_PROVIDER_SITE_OTHER): Payer: Medicaid Other | Admitting: Certified Nurse Midwife

## 2015-08-27 ENCOUNTER — Encounter: Payer: Self-pay | Admitting: Obstetrics

## 2015-08-27 ENCOUNTER — Encounter: Payer: Medicaid Other | Admitting: Obstetrics

## 2015-08-27 ENCOUNTER — Other Ambulatory Visit: Payer: Self-pay | Admitting: *Deleted

## 2015-08-27 VITALS — BP 107/68 | HR 89 | Temp 98.3°F | Wt 150.0 lb

## 2015-08-27 DIAGNOSIS — O219 Vomiting of pregnancy, unspecified: Secondary | ICD-10-CM

## 2015-08-27 DIAGNOSIS — Z3482 Encounter for supervision of other normal pregnancy, second trimester: Secondary | ICD-10-CM

## 2015-08-27 DIAGNOSIS — G4700 Insomnia, unspecified: Secondary | ICD-10-CM

## 2015-08-27 LAB — POCT URINALYSIS DIPSTICK
BILIRUBIN UA: NEGATIVE
Glucose, UA: NEGATIVE
KETONES UA: NEGATIVE
Leukocytes, UA: NEGATIVE
Nitrite, UA: NEGATIVE
PH UA: 7.5
Protein, UA: NEGATIVE
RBC UA: NEGATIVE
SPEC GRAV UA: 1.015
Urobilinogen, UA: NEGATIVE

## 2015-08-27 MED ORDER — PROMETHAZINE HCL 25 MG PO TABS: 25.0000 mg | ORAL_TABLET | Freq: Four times a day (QID) | ORAL | Status: DC | PRN

## 2015-08-27 MED ORDER — ONDANSETRON HCL 4 MG PO TABS: 4.0000 mg | ORAL_TABLET | Freq: Every day | ORAL | Status: DC | PRN

## 2015-08-27 MED ORDER — DIPHENHYDRAMINE HCL 25 MG PO TABS: 25.0000 mg | ORAL_TABLET | Freq: Every evening | ORAL | Status: DC | PRN

## 2015-08-27 MED ORDER — DOXYLAMINE-PYRIDOXINE 10-10 MG PO TBEC: DELAYED_RELEASE_TABLET | ORAL | Status: DC

## 2015-08-27 NOTE — Progress Notes (Signed)
Subjective:    Carrie Nielsen is being seen today for her first obstetrical visit.  This is not a planned pregnancy. She is at 178w2d gestation. Her obstetrical history is significant for none. Relationship with FOB: significant other, not living together. Patient does intend to breast feed. Pregnancy history fully reviewed.  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    The information documented in the HPI was reviewed and verified.  Menstrual History: OB History    Gravida Para Term Preterm AB TAB SAB Ectopic Multiple Living   3 1 1       1       Menarche age: 89  Patient's last menstrual period was 05/09/2015.    Past Medical History  Diagnosis Date  . History of cold sores   . Medical history non-contributory     Past Surgical History  Procedure Laterality Date  . Wisdom tooth extraction       (Not in a hospital admission) No Known Allergies  Social History  Substance Use Topics  . Smoking status: Never Smoker   . Smokeless tobacco: Never Used  . Alcohol Use: No    Family History  Problem Relation Age of Onset  . Alcohol abuse Neg Hx   . Arthritis Neg Hx   . Asthma Neg Hx   . Birth defects Neg Hx   . Cancer Neg Hx   . COPD Neg Hx   . Depression Neg Hx   . Diabetes Neg Hx   . Early death Neg Hx   . Drug abuse Neg Hx   . Hearing loss Neg Hx   . Heart disease Neg Hx   . Hyperlipidemia Neg Hx   . Hypertension Neg Hx   . Kidney disease Neg Hx   . Learning disabilities Neg Hx   . Mental illness Neg Hx   . Mental retardation Neg Hx   . Miscarriages / Stillbirths Neg Hx   . Stroke Neg Hx   . Vision loss Neg Hx      Review of Systems Constitutional: negative for weight loss Gastrointestinal: +for nausea and vomiting at least 2 x/day Genitourinary:negative for genital lesions and vaginal discharge and dysuria Musculoskeletal:negative for back pain Behavioral/Psych: negative for abusive relationship, depression, illegal drug  usage and tobacco use    Objective:    BP 107/68 mmHg  Pulse 89  Temp(Src) 98.3 F (36.8 C)  Wt 150 lb (68.04 kg)  LMP 05/09/2015 General Appearance:    Alert, cooperative, no distress, appears stated age  Head:    Normocephalic, without obvious abnormality, atraumatic  Eyes:    PERRL, conjunctiva/corneas clear, EOM's intact, fundi    benign, both eyes  Ears:    Normal TM's and external ear canals, both ears  Nose:   Nares normal, septum midline, mucosa normal, no drainage    or sinus tenderness  Throat:   Lips, mucosa, and tongue normal; teeth and gums normal  Neck:   Supple, symmetrical, trachea midline, no adenopathy;    thyroid:  no enlargement/tenderness/nodules; no carotid   bruit or JVD  Back:     Symmetric, no curvature, ROM normal, no CVA tenderness  Lungs:     Clear to auscultation bilaterally, respirations unlabored  Chest Wall:    No tenderness or deformity   Heart:    Regular rate and rhythm, S1 and S2 normal, no murmur, rub   or gallop  Breast Exam:    No tenderness, masses, or  nipple abnormality  Abdomen:     Soft, non-tender, bowel sounds active all four quadrants,    no masses, no organomegaly  Genitalia:    Normal female without lesion, discharge or tenderness  Extremities:   Extremities normal, atraumatic, no cyanosis or edema  Pulses:   2+ and symmetric all extremities  Skin:   Skin color, texture, turgor normal, no rashes or lesions  Lymph nodes:   Cervical, supraclavicular, and axillary nodes normal  Neurologic:   CNII-XII intact, normal strength, sensation and reflexes    throughout      Lab Review Urine pregnancy test Labs reviewed yes Radiologic studies reviewed yes Assessment:    Pregnancy at [redacted]w[redacted]d weeks   N&V in early pregnancy  Plan:      Prenatal vitamins.  Counseling provided regarding continued use of seat belts, cessation of alcohol consumption, smoking or use of illicit drugs; infection precautions i.e., influenza/TDAP immunizations,  toxoplasmosis,CMV, parvovirus, listeria and varicella; workplace safety, exercise during pregnancy; routine dental care, safe medications, sexual activity, hot tubs, saunas, pools, travel, caffeine use, fish and methlymercury, potential toxins, hair treatments, varicose veins Weight gain recommendations per IOM guidelines reviewed: underweight/BMI< 18.5--> gain 28 - 40 lbs; normal weight/BMI 18.5 - 24.9--> gain 25 - 35 lbs; overweight/BMI 25 - 29.9--> gain 15 - 25 lbs; obese/BMI >30->gain  11 - 20 lbs Problem list reviewed and updated. FIRST/CF mutation testing/NIPT/QUAD SCREEN/fragile X/Ashkenazi Jewish population testing/Spinal muscular atrophy discussed: ordered. Role of ultrasound in pregnancy discussed; fetal survey: ordered. Amniocentesis discussed: not indicated. VBAC calculator score: VBAC consent form provided Meds ordered this encounter  Medications  . promethazine (PHENERGAN) 25 MG tablet    Sig: Take 1 tablet (25 mg total) by mouth every 6 (six) hours as needed for nausea or vomiting.    Dispense:  30 tablet    Refill:  1  . Doxylamine-Pyridoxine (DICLEGIS) 10-10 MG TBEC    Sig: Take 1 tablet with breakfast and lunch, 2 tablets at bedtime.    Dispense:  100 tablet    Refill:  4  . diphenhydrAMINE (BENADRYL) 25 MG tablet    Sig: Take 1 tablet (25 mg total) by mouth at bedtime as needed.    Dispense:  60 tablet    Refill:  6  . ondansetron (ZOFRAN) 4 MG tablet    Sig: Take 1 tablet (4 mg total) by mouth daily as needed.    Dispense:  30 tablet    Refill:  1   Orders Placed This Encounter  Procedures  . US OB Comp + 14 Wk    Standing Status: Future     Number of Occurrences:      Standing Expiration Date: 10/26/2016    Order Specific Question:  Reason for Exam (SYMPTOM  OR DIAGNOSIS REQUIRED)    Answer:  anatomy    Order Specific Question:  Preferred imaging location?    Answer:  Internal  . AFP, Quad Screen    Order Specific Question:  Repeat Sample    Answer:  No     Order Specific Question:  Maternal Race    Answer:  African American    Order Specific Question:  EDD    Answer:  02/02/2016    Order Specific Question:  Pregnancy Donor Egg (Y/N)    Answer:  No    Order Specific Question:  Gest Age at U/S (Wk.Dy)    Answer:  16.4    Order Specific Question:  Number of Fetuses    Answer:  1  Order Specific Question:  Ultrasound Date    Answer:  08/22/2015    Order Specific Question:  Hx of OSB/NTD?    Answer:  No    Order Specific Question:  History of Down Syndrome?    Answer:  No    Order Specific Question:  Maternal IDDM (insulin-dependent diabetes mellitus)    Answer:  No    Order Specific Question:  Maternal Weight (lbs)    Answer:  140    Order Specific Question:  Brief History DS    Answer:  NA    Order Specific Question:  Brief History NTD    Answer:  NA  . POCT urinalysis dipstick    Follow up in 4 weeks. 50% of 30 min visit spent on counseling and coordination of care.

## 2015-08-27 NOTE — Progress Notes (Signed)
Pt states that she is having some back pain and pelvic cramping.

## 2015-08-28 LAB — AFP, QUAD SCREEN
AFP: 50 ng/mL
Curr Gest Age: 17.2 wks.days
HCG, Total: 72.99 IU/mL
INH: 165.3 pg/mL
INTERPRETATION-AFP: NEGATIVE
MOM FOR AFP: 1.06
MOM FOR HCG: 2.05
MoM for INH: 0.95
Open Spina bifida: NEGATIVE
Osb Risk: 1:21400 {titer}
TRI 18 SCR RISK EST: NEGATIVE
UE3 VALUE: 0.72 ng/mL
uE3 Mom: 0.64

## 2015-09-02 DIAGNOSIS — Z3482 Encounter for supervision of other normal pregnancy, second trimester: Secondary | ICD-10-CM | POA: Diagnosis not present

## 2015-09-02 DIAGNOSIS — Z3A17 17 weeks gestation of pregnancy: Secondary | ICD-10-CM | POA: Diagnosis not present

## 2015-09-19 ENCOUNTER — Ambulatory Visit (INDEPENDENT_AMBULATORY_CARE_PROVIDER_SITE_OTHER): Payer: Medicaid Other

## 2015-09-19 ENCOUNTER — Encounter: Payer: Self-pay | Admitting: Obstetrics

## 2015-09-19 ENCOUNTER — Ambulatory Visit (INDEPENDENT_AMBULATORY_CARE_PROVIDER_SITE_OTHER): Payer: Medicaid Other | Admitting: Obstetrics

## 2015-09-19 VITALS — BP 120/81 | HR 83 | Temp 98.0°F | Wt 157.0 lb

## 2015-09-19 DIAGNOSIS — Z3482 Encounter for supervision of other normal pregnancy, second trimester: Secondary | ICD-10-CM | POA: Diagnosis not present

## 2015-09-19 LAB — POCT URINALYSIS DIPSTICK
Bilirubin, UA: NEGATIVE
Glucose, UA: NEGATIVE
Ketones, UA: NEGATIVE
LEUKOCYTES UA: NEGATIVE
NITRITE UA: NEGATIVE
PH UA: 7
PROTEIN UA: NEGATIVE
RBC UA: NEGATIVE
Spec Grav, UA: 1.01
UROBILINOGEN UA: NEGATIVE

## 2015-09-19 NOTE — Progress Notes (Signed)
Subjective:    Carrie Nielsen is a 24 y.o. female being seen today for her obstetrical visit. She is at 7251w4d gestation. Patient reports: no complaints . Fetal movement: normal.  Problem List Items Addressed This Visit    None    Visit Diagnoses    Supervision of other normal pregnancy, antepartum, second trimester    -  Primary    Relevant Orders    POCT urinalysis dipstick      Patient Active Problem List   Diagnosis Date Noted  . Chlamydia infection 06/16/2014  . 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 Esophageal reflux 02/08/2013   Objective:    BP 120/81 mmHg  Pulse 83  Temp(Src) 98 F (36.7 C)  Wt 157 lb (71.215 kg)  LMP 05/09/2015 FHT: 150 BPM  Uterine Size: size equals dates     Assessment:    Pregnancy @ 451w4d    Plan:      Labs, problem list reviewed and updated 2 hr GTT planned Follow up in 4 weeks.

## 2015-10-11 ENCOUNTER — Other Ambulatory Visit: Payer: Self-pay | Admitting: Certified Nurse Midwife

## 2015-10-16 ENCOUNTER — Encounter: Payer: Medicaid Other | Admitting: Obstetrics

## 2015-10-21 ENCOUNTER — Encounter: Payer: Medicaid Other | Admitting: Obstetrics

## 2015-10-30 ENCOUNTER — Encounter: Payer: Medicaid Other | Admitting: Obstetrics

## 2015-11-05 ENCOUNTER — Encounter: Payer: Medicaid Other | Admitting: Obstetrics

## 2015-11-08 ENCOUNTER — Telehealth: Payer: Self-pay | Admitting: Obstetrics

## 2015-11-10 NOTE — L&D Delivery Note (Signed)
Delivery Note  This is a 25 year old G 2 P1 who was admitted for Not in labor. and IOL for post dates. She progressed with cytotec to the second stage of labor. At 9.5 cm had prolonged decel:  Amnioinfusion and IVF bolus started, position changed to gaskin's.  Around 4:35 PM notified of deceling again, ran over, (Dr. Clearance CootsHarper notified of patient status) then  she pushed for 10 min.  NICU team present for delivery.   Operative Delivery Note At 4:42 PM a viable female was delivered via Vaginal, Vacuum (Extractor) by Dr. Clearance CootsHarper. Low Suction 1 easy pull.   Presentation: vertex; Position: Occiput,, Posterior; Station: +3.  Verbal consent: obtained from patient.  Risks and benefits discussed in detail.  Risks include, but are not limited to the risks of anesthesia, bleeding, infection, damage to maternal tissues, fetal cephalhematoma.  There is also the risk of inability to effect vaginal delivery of the head, or shoulder dystocia that cannot be resolved by established maneuvers, leading to the need for emergency cesarean section.  APGAR: 8, 9; weight  .   Placenta status: Intact, Spontaneous.   Cord: 3 vessels with the following complications: None.  Cord pH: N/A  A nuchal cord  was not identified. Infant placed on maternal abdomen.  Delayed cord clamping was performed for 5 minutes. NICU team departed when infant stabilized.  Cord double clamped and cut.  Apgar scores were 8 and 9. The placenta delivered spontaneously, shultz, with a 3 vessel cord.  Inspection revealed 1st degree and vaginal. The uterus was firm bleeding stable.  The repair was done under epidural.   EBL was 150.    Placenta was sent.  There were no complications during the procedure.  Mom and baby skin to skin following delivery. Left in stable condition.   Anesthesia: Epidural  Instruments: count was correct Episiotomy: None Lacerations: 1st degree Suture Repair: 3.0 vicryl Est. Blood Loss (mL):  150  Mom to postpartum.  Baby to  Couplet care / Skin to Skin.  Carrie Nielsen, CNM 02/11/2016, 5:20 PM

## 2015-11-13 ENCOUNTER — Encounter: Payer: Self-pay | Admitting: Obstetrics

## 2015-11-13 ENCOUNTER — Ambulatory Visit (INDEPENDENT_AMBULATORY_CARE_PROVIDER_SITE_OTHER): Payer: Medicaid Other | Admitting: Obstetrics

## 2015-11-13 VITALS — BP 110/66 | HR 97 | Temp 97.8°F | Wt 174.0 lb

## 2015-11-13 DIAGNOSIS — R12 Heartburn: Secondary | ICD-10-CM

## 2015-11-13 DIAGNOSIS — G44019 Episodic cluster headache, not intractable: Secondary | ICD-10-CM

## 2015-11-13 DIAGNOSIS — Z3483 Encounter for supervision of other normal pregnancy, third trimester: Secondary | ICD-10-CM

## 2015-11-13 DIAGNOSIS — O26893 Other specified pregnancy related conditions, third trimester: Secondary | ICD-10-CM

## 2015-11-13 DIAGNOSIS — N760 Acute vaginitis: Secondary | ICD-10-CM

## 2015-11-13 DIAGNOSIS — O99013 Anemia complicating pregnancy, third trimester: Secondary | ICD-10-CM

## 2015-11-13 DIAGNOSIS — A6000 Herpesviral infection of urogenital system, unspecified: Secondary | ICD-10-CM

## 2015-11-13 MED ORDER — VITAFOL GUMMIES 3.33-0.333-34.8 MG PO CHEW: 1.0000 | CHEWABLE_TABLET | Freq: Every day | ORAL | Status: DC

## 2015-11-13 MED ORDER — TERCONAZOLE 0.4 % VA CREA: 1.0000 | TOPICAL_CREAM | Freq: Every day | VAGINAL | Status: DC

## 2015-11-13 MED ORDER — BUTALBITAL-APAP-CAFFEINE 50-325-40 MG PO TABS: 2.0000 | ORAL_TABLET | Freq: Four times a day (QID) | ORAL | Status: DC | PRN

## 2015-11-13 MED ORDER — VALACYCLOVIR HCL 1 G PO TABS: 1000.0000 mg | ORAL_TABLET | Freq: Two times a day (BID) | ORAL | Status: DC | PRN

## 2015-11-13 MED ORDER — RANITIDINE HCL 150 MG PO TABS: 150.0000 mg | ORAL_TABLET | Freq: Two times a day (BID) | ORAL | Status: DC

## 2015-11-13 NOTE — Addendum Note (Signed)
Addended by: Marya LandryFOSTER, SUZANNE D on: 11/13/2015 03:06 PM   Modules accepted: Orders

## 2015-11-13 NOTE — Progress Notes (Signed)
Subjective:    Carrie Nielsen is a 25 y.o. female being seen today for her obstetrical visit. She is at 332w3d gestation. Patient reports vaginal irritation and vaginal dischargeAlso has heartburn and more frequent HA 's, unrelieved by Tylenol.  Fetal movement: normal.  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 Chlamydia infection 06/16/2014  . 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 Esophageal reflux 02/08/2013   Objective:    BP 110/66 mmHg  Pulse 97  Temp(Src) 97.8 F (36.6 C)  Wt 174 lb (78.926 kg)  LMP 05/09/2015 FHT:  150 BPM  Uterine Size: size equals dates  Presentation: unsure     Assessment:    Pregnancy @ 492w3d weeks    GERD  Headaches  H/O GC earlier in pregnancy  H/O Genital Herpes.  No outbreaks during pregnancy.  Plan:   Zantac Rx Fioricet Rx Wet prep and cultures done Valtrex Rx prn    labs reviewed, problem list updated Consent signed. GBS sent TDAP offered   Rhogam given for RH negative Pediatrician: discussed. Infant feeding: plans to breastfeed. Maternity leave: discussed. Cigarette smoking: never smoked. Orders Placed This Encounter  Procedures  . POCT urinalysis dipstick   Meds ordered this encounter  Medications  . ranitidine (ZANTAC) 150 MG tablet    Sig: Take 1 tablet (150 mg total) by mouth 2 (two) times daily.    Dispense:  30 tablet    Refill:  5  . terconazole (TERAZOL 7) 0.4 % vaginal cream    Sig: Place 1 applicator vaginally at bedtime.    Dispense:  45 g    Refill:  2  . valACYclovir (VALTREX) 1000 MG tablet    Sig: Take 1 tablet (1,000 mg total) by mouth 2 (two) times daily as needed (for outbreaks). Take for 3 days prn each outbreak.    Dispense:  30 tablet    Refill:  prn  . Prenatal Vit-Fe Phos-FA-Omega (VITAFOL GUMMIES) 3.33-0.333-34.8 MG CHEW    Sig: Chew 1 tablet by mouth daily before breakfast.    Dispense:  90 tablet    Refill:  3  . butalbital-acetaminophen-caffeine (FIORICET) 50-325-40 MG tablet    Sig: Take 2 tablets by mouth every 6 (six) hours as needed for headache.    Dispense:  40 tablet    Refill:  2   Follow up in 2 Weeks.

## 2015-11-13 NOTE — Telephone Encounter (Signed)
[id number] - Patient made and kept appt for today. brm

## 2015-11-15 ENCOUNTER — Other Ambulatory Visit: Payer: Medicaid Other

## 2015-11-17 LAB — SURESWAB, VAGINOSIS/VAGINITIS PLUS
ATOPOBIUM VAGINAE: NOT DETECTED Log (cells/mL)
BV CATEGORY: UNDETERMINED — AB
C. GLABRATA, DNA: NOT DETECTED
C. TROPICALIS, DNA: NOT DETECTED
C. albicans, DNA: DETECTED — AB
C. parapsilosis, DNA: NOT DETECTED
C. trachomatis RNA, TMA: NOT DETECTED
GARDNERELLA VAGINALIS: 6.8 Log (cells/mL)
LACTOBACILLUS SPECIES: 6.9 Log (cells/mL)
MEGASPHAERA SPECIES: NOT DETECTED Log (cells/mL)
N. GONORRHOEAE RNA, TMA: NOT DETECTED
T. vaginalis RNA, QL TMA: NOT DETECTED

## 2015-11-18 ENCOUNTER — Other Ambulatory Visit: Payer: Self-pay | Admitting: Obstetrics

## 2015-11-18 DIAGNOSIS — B9689 Other specified bacterial agents as the cause of diseases classified elsewhere: Secondary | ICD-10-CM

## 2015-11-18 DIAGNOSIS — N760 Acute vaginitis: Principal | ICD-10-CM

## 2015-11-18 MED ORDER — METRONIDAZOLE 500 MG PO TABS: 500.0000 mg | ORAL_TABLET | Freq: Two times a day (BID) | ORAL | Status: DC

## 2015-11-27 ENCOUNTER — Ambulatory Visit (INDEPENDENT_AMBULATORY_CARE_PROVIDER_SITE_OTHER): Payer: Medicaid Other | Admitting: Obstetrics

## 2015-11-27 VITALS — BP 114/72 | HR 87 | Temp 98.2°F | Wt 182.0 lb

## 2015-11-27 DIAGNOSIS — B9689 Other specified bacterial agents as the cause of diseases classified elsewhere: Secondary | ICD-10-CM

## 2015-11-27 DIAGNOSIS — Z3493 Encounter for supervision of normal pregnancy, unspecified, third trimester: Secondary | ICD-10-CM

## 2015-11-27 DIAGNOSIS — N760 Acute vaginitis: Secondary | ICD-10-CM

## 2015-11-27 DIAGNOSIS — A499 Bacterial infection, unspecified: Secondary | ICD-10-CM

## 2015-11-27 LAB — POCT URINALYSIS DIPSTICK
BILIRUBIN UA: NEGATIVE
GLUCOSE UA: 50
KETONES UA: NEGATIVE
Leukocytes, UA: NEGATIVE
Nitrite, UA: NEGATIVE
Protein, UA: NEGATIVE
RBC UA: NEGATIVE
SPEC GRAV UA: 1.02
UROBILINOGEN UA: NEGATIVE
pH, UA: 5

## 2015-11-27 MED ORDER — METRONIDAZOLE 500 MG PO TABS: 500.0000 mg | ORAL_TABLET | Freq: Two times a day (BID) | ORAL | Status: DC

## 2015-11-28 ENCOUNTER — Encounter: Payer: Self-pay | Admitting: Obstetrics

## 2015-11-28 NOTE — Progress Notes (Signed)
Subjective:    Carrie Nielsen is a 25 y.o. female being seen today for her obstetrical visit. She is at [redacted]w[redacted]d gestation. Patient reports no complaints. Fetal movement: normal.  Problem List Items Addressed This Visit    None    Visit Diagnoses    Prenatal care in third trimester    -  Primary    Relevant Orders    POCT Glucose (CBG)    POCT urinalysis dipstick (Completed)    BV (bacterial vaginosis)        Relevant Medications    metroNIDAZOLE (FLAGYL) 500 MG tablet      Patient Active Problem List   Diagnosis Date Noted  . Chlamydia infection 06/16/2014  . 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 Esophageal reflux 02/08/2013   Objective:    BP 114/72 mmHg  Pulse 87  Temp(Src) 98.2 F (36.8 C)  Wt 182 lb (82.555 kg)  LMP 05/09/2015 FHT:  150 BPM  Uterine Size: size equals dates  Presentation: unsure     Assessment:    Pregnancy @ [redacted]w[redacted]d weeks   Plan:     labs reviewed, problem list updated Consent signed. GBS sent TDAP offered  Rhogam given for RH negative Pediatrician: discussed. Infant feeding: plans to breastfeed. Maternity leave: discussed. Cigarette smoking: never smoked. Orders Placed This Encounter  Procedures  . POCT Glucose (CBG)  . POCT urinalysis dipstick   Meds ordered this encounter  Medications  . metroNIDAZOLE (FLAGYL) 500 MG tablet    Sig: Take 1 tablet (500 mg total) by mouth 2 (two) times daily.    Dispense:  14 tablet    Refill:  2   Follow up in 2 Weeks.

## 2015-12-11 ENCOUNTER — Encounter: Payer: Medicaid Other | Admitting: Obstetrics

## 2015-12-16 ENCOUNTER — Ambulatory Visit (INDEPENDENT_AMBULATORY_CARE_PROVIDER_SITE_OTHER): Payer: Medicaid Other | Admitting: Obstetrics

## 2015-12-16 ENCOUNTER — Encounter: Payer: Self-pay | Admitting: Obstetrics

## 2015-12-16 VITALS — BP 111/75 | HR 96 | Temp 97.6°F | Wt 191.0 lb

## 2015-12-16 DIAGNOSIS — Z1389 Encounter for screening for other disorder: Secondary | ICD-10-CM

## 2015-12-16 DIAGNOSIS — Z3483 Encounter for supervision of other normal pregnancy, third trimester: Secondary | ICD-10-CM

## 2015-12-16 DIAGNOSIS — Z331 Pregnant state, incidental: Secondary | ICD-10-CM

## 2015-12-16 LAB — POCT URINALYSIS DIPSTICK
BILIRUBIN UA: NEGATIVE
Glucose, UA: 100
Ketones, UA: NEGATIVE
Leukocytes, UA: NEGATIVE
NITRITE UA: NEGATIVE
PH UA: 5
Protein, UA: NEGATIVE
RBC UA: NEGATIVE
SPEC GRAV UA: 1.02
Urobilinogen, UA: NEGATIVE

## 2015-12-16 NOTE — Progress Notes (Signed)
Subjective:    Carrie Nielsen is a 25 y.o. female being seen today for her obstetrical visit. She is at [redacted]w[redacted]d gestation. Patient reports no complaints. Fetal movement: normal.  Problem List Items Addressed This Visit    None    Visit Diagnoses    Supervision of other normal pregnancy, antepartum, third trimester    -  Primary    Relevant Orders    POCT urinalysis dipstick      Patient Active Problem List   Diagnosis Date Noted  . Chlamydia infection 06/16/2014  . 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 Esophageal reflux 02/08/2013   Objective:    BP 111/75 mmHg  Pulse 96  Temp(Src) 97.6 F (36.4 C)  Wt 191 lb (86.637 kg)  LMP 05/09/2015 FHT:  150 BPM  Uterine Size: size equals dates  Presentation: unsure     Assessment:    Pregnancy @ [redacted]w[redacted]d weeks   Plan:     labs reviewed, problem list updated Consent signed. GBS sent TDAP offered  Rhogam given for RH negative Pediatrician: discussed. Infant feeding: plans to breastfeed. Maternity leave: discussed. Cigarette smoking: never smoked. Orders Placed This Encounter  Procedures  . POCT urinalysis dipstick   No orders of the defined types were placed in this encounter.   Follow up in 2 Weeks.

## 2015-12-30 ENCOUNTER — Encounter: Payer: Medicaid Other | Admitting: Obstetrics

## 2016-01-27 ENCOUNTER — Encounter (HOSPITAL_COMMUNITY): Payer: Self-pay | Admitting: *Deleted

## 2016-01-27 ENCOUNTER — Ambulatory Visit (INDEPENDENT_AMBULATORY_CARE_PROVIDER_SITE_OTHER): Payer: Medicaid Other | Admitting: Obstetrics

## 2016-01-27 ENCOUNTER — Inpatient Hospital Stay (HOSPITAL_COMMUNITY): Payer: Medicaid Other

## 2016-01-27 ENCOUNTER — Inpatient Hospital Stay (HOSPITAL_COMMUNITY)
Admission: AD | Admit: 2016-01-27 | Discharge: 2016-01-27 | Disposition: A | Discharge status: Home / Self Care | Payer: Medicaid Other | Source: Ambulatory Visit | Attending: Obstetrics | Admitting: Obstetrics

## 2016-01-27 VITALS — BP 114/72 | HR 102 | Temp 98.1°F | Wt 209.0 lb

## 2016-01-27 DIAGNOSIS — Z3A39 39 weeks gestation of pregnancy: Secondary | ICD-10-CM | POA: Diagnosis not present

## 2016-01-27 DIAGNOSIS — Z3483 Encounter for supervision of other normal pregnancy, third trimester: Secondary | ICD-10-CM

## 2016-01-27 DIAGNOSIS — O288 Other abnormal findings on antenatal screening of mother: Secondary | ICD-10-CM

## 2016-01-27 DIAGNOSIS — O289 Unspecified abnormal findings on antenatal screening of mother: Secondary | ICD-10-CM | POA: Diagnosis present

## 2016-01-27 DIAGNOSIS — Z3689 Encounter for other specified antenatal screening: Secondary | ICD-10-CM

## 2016-01-27 LAB — OB RESULTS CONSOLE GC/CHLAMYDIA
Chlamydia: NEGATIVE
Gonorrhea: NEGATIVE

## 2016-01-27 LAB — POCT URINALYSIS DIPSTICK
Bilirubin, UA: NEGATIVE
Blood, UA: NEGATIVE
GLUCOSE UA: NEGATIVE
KETONES UA: NEGATIVE
LEUKOCYTES UA: NEGATIVE
Nitrite, UA: NEGATIVE
Protein, UA: 30
SPEC GRAV UA: 1.025
UROBILINOGEN UA: NEGATIVE
pH, UA: 5

## 2016-01-27 NOTE — MAU Provider Note (Signed)
Chief Complaint:  MD sent ? [place]    None     HPI: Carrie Nielsen is a 25 y.o. G2P1001 at 57w1dwho presents to maternity admissions sent from the office for nonreactive NST today.  She is unsure of why she had NST today.  She denies complications during this pregnancy. She reports good fetal movement, denies regular contractions, LOF, vaginal bleeding, vaginal itching/burning, urinary symptoms, h/a, dizziness, n/v, or fever/chills.    HPI  Past Medical History: Past Medical History  Diagnosis Date  . History of cold sores   . Medical history non-contributory     Past obstetric history: OB History  Gravida Para Term Preterm AB SAB TAB Ectopic Multiple Living  # Outcome Date GA Lbr Len/2nd Weight Sex Delivery Anes PTL Lv  2 Current           1 Term 05/31/13 [redacted]w[redacted]d 17:30 / 02:37 7 lb 1.2 oz (3.21 kg) M Vag-Spont EPI  Y      Past Surgical History: Past Surgical History  Procedure Laterality Date  . Wisdom tooth extraction    . No past surgeries      Family History: Family History  Problem Relation Age of Onset  . Alcohol abuse Neg Hx   . Arthritis Neg Hx   . Asthma Neg Hx   . Birth defects Neg Hx   . COPD Neg Hx   . Depression Neg Hx   . Diabetes Neg Hx   . Early death Neg Hx   . Drug abuse Neg Hx   . Hearing loss Neg Hx   . Heart disease Neg Hx   . Hyperlipidemia Neg Hx   . Hypertension Neg Hx   . Kidney disease Neg Hx   . Learning disabilities Neg Hx   . Mental illness Neg Hx   . Mental retardation Neg Hx   . Miscarriages / Stillbirths Neg Hx   . Stroke Neg Hx   . Vision loss Neg Hx   . Cancer Maternal Grandfather     brain    Social History: Social History  Substance Use Topics  . Smoking status: Never Smoker   . Smokeless tobacco: Never Used  . Alcohol Use: No    Allergies: No Known Allergies  Meds:  Prescriptions prior to admission  Medication Sig Dispense Refill Last Dose  . valACYclovir (VALTREX) 1000 MG tablet Take 1 tablet  (1,000 mg total) by mouth 2 (two) times daily as needed (for outbreaks). Take for 3 days prn each outbreak. 30 tablet prn 01/27/2016 at Unknown time  . butalbital-acetaminophen-caffeine (FIORICET) 50-325-40 MG tablet Take 2 tablets by mouth every 6 (six) hours as needed for headache. (Patient not taking: Reported on 12/16/2015) 40 tablet 2 Not Taking  . Doxylamine-Pyridoxine (DICLEGIS) 10-10 MG TBEC Take 1 tablet with breakfast and lunch, 2 tablets at bedtime. (Patient not taking: Reported on 12/16/2015) 100 tablet 4 Not Taking  . metroNIDAZOLE (FLAGYL) 500 MG tablet Take 1 tablet (500 mg total) by mouth 2 (two) times daily. (Patient not taking: Reported on 12/16/2015) 14 tablet 2 Completed Course at Unknown time  . ondansetron (ZOFRAN) 4 MG tablet Take 1 tablet (4 mg total) by mouth daily as needed. (Patient not taking: Reported on 11/13/2015) 30 tablet 1 Not Taking  . Prenatal Vit-Fe Phos-FA-Omega (VITAFOL GUMMIES) 3.33-0.333-34.8 MG CHEW Chew 1 tablet by mouth daily before breakfast. (Patient not taking: Reported on 11/27/2015) 90 tablet 3 Not  Taking  . ranitidine (ZANTAC) 150 MG tablet Take 1 tablet (150 mg total) by mouth 2 (two) times daily. (Patient not taking: Reported on 12/16/2015) 30 tablet 5 Not Taking  . [DISCONTINUED] Prenat-FeCbn-FeAspGl-FA-Omega (OB COMPLETE PETITE) 35-5-1-200 MG CAPS Take 1 capsule by mouth daily before breakfast. (Patient not taking: Reported on 11/27/2015) 90 capsule 3 Not Taking    ROS:  Review of Systems  Constitutional: Negative for fever, chills and fatigue.  Eyes: Negative for visual disturbance.  Respiratory: Negative for shortness of breath.   Cardiovascular: Negative for chest pain.  Gastrointestinal: Negative for nausea, vomiting and abdominal pain.  Genitourinary: Negative for dysuria, flank pain, vaginal bleeding, vaginal discharge, difficulty urinating, vaginal pain and pelvic pain.  Neurological: Negative for dizziness and headaches.  Psychiatric/Behavioral:  Negative.      I have reviewed patient's Past Medical Hx, Surgical Hx, Family Hx, Social Hx, medications and allergies.   Physical Exam  Patient Vitals for the past 24 hrs:  BP Temp Temp src Pulse Resp  01/27/16 1345 123/71 mmHg - - 93 -  01/27/16 1227 122/68 mmHg 98.3 F (36.8 C) Oral 94 16   Constitutional: Well-developed, well-nourished female in no acute distress.  Cardiovascular: normal rate Respiratory: normal effort GI: Abd soft, non-tender, gravid appropriate for gestational age.  MS: Extremities nontender, no edema, normal ROM Neurologic: Alert and oriented x 4.  GU: Neg CVAT.  PELVIC EXAM: Cervix pink, visually closed, without lesion, scant white creamy discharge, vaginal walls and external genitalia normal Bimanual exam: Cervix 0/long/high, firm, anterior, neg CMT, uterus nontender, nonenlarged, adnexa without tenderness, enlargement, or mass     FHT:  Baseline 140 , moderate variability, accelerations present, no decelerations Contractions: irregular, mild to palpation   Labs: Results for orders placed or performed in visit on 01/27/16 (from the past 24 hour(s))  POCT urinalysis dipstick     Status: None   Collection Time: 01/27/16  1:42 PM  Result Value Ref Range   Color, UA yellow    Clarity, UA hazy    Glucose, UA neg    Bilirubin, UA neg    Ketones, UA neg    Spec Grav, UA 1.025    Blood, UA neg    pH, UA 5.0    Protein, UA 30    Urobilinogen, UA negative    Nitrite, UA neg    Leukocytes, UA Negative Negative   O/POS/-- (10/13 1657)  Imaging:  No results found.  MAU Course/MDM: I have ordered labs and reviewed results.  Reviewed FHR tracing and US results. NST reactive in MAU and BPP 8/8.  Consult Dr Clearance CootsHarper.  Discharge home today with labor precautions/fetal kick count review. Pt stable at time of discharge.  Assessment: 1. NST (non-stress test) reactive   2. Non-reactive NST (non-stress test)     Plan: Discharge home Labor precautions and  fetal kick counts  Follow-up Information    Follow up with HARPER,CHARLES A, MD.   Specialty:  Obstetrics and Gynecology   Why:  As scheduled, Return to MAU as needed for emergencies   Contact information:   [address] [place] [id number] [phone]        Medication List    STOP taking these medications        butalbital-acetaminophen-caffeine 50-325-40 MG tablet  Commonly known as:  FIORICET     Doxylamine-Pyridoxine 10-10 MG Tbec  Commonly known as:  DICLEGIS     metroNIDAZOLE 500 MG tablet  Commonly known as:  FLAGYL     ondansetron 4 MG tablet  Commonly known as:  ZOFRAN     ranitidine 150 MG tablet  Commonly known as:  ZANTAC     VITAFOL GUMMIES 3.33-0.333-34.8 MG Chew      TAKE these medications        valACYclovir 1000 MG tablet  Commonly known as:  VALTREX  Take 1 tablet (1,000 mg total) by mouth 2 (two) times daily as needed (for outbreaks). Take for 3 days prn each outbreak.        Sharen Counter Certified Nurse-Midwife 01/27/2016 2:38 PM

## 2016-01-27 NOTE — MAU Note (Signed)
Sent from dr's office, pt does not know why.  BP was ok, reports good fetal movement, states some sort of [place]

## 2016-01-27 NOTE — MAU Note (Signed)
US has no orders for pt

## 2016-01-27 NOTE — Discharge Instructions (Signed)
Reasons to return to [facility]: ° °1.  Contractions are  5 minutes apart or less, each last 1 minute, these have been going on for 1-2 hours, and you cannot walk or talk during them °2.  You have a large gush of fluid, or a trickle of fluid that will not stop and you have to wear a pad °3.  You have bleeding that is bright red, heavier than spotting--like menstrual bleeding (spotting can be normal in early labor or after a check of your cervix) °4.  You do not feel the baby moving like he/she normally does ° ° °Fetal Movement Counts °Patient Name: __________________________________________________ Patient Due Date: ____________________ °Performing a fetal movement count is highly recommended in high-risk pregnancies, but it is good for every pregnant woman to do. Your health care provider may ask you to start counting fetal movements at 28 weeks of the pregnancy. Fetal movements often increase: °· After eating a full meal. °· After physical activity. °· After eating or drinking something sweet or cold. °· At rest. °Pay attention to when you feel the baby is most active. This will help you notice a pattern of your baby's sleep and wake cycles and what factors contribute to an increase in fetal movement. It is important to perform a fetal movement count at the same time each day when your baby is normally most active.  °HOW TO COUNT FETAL MOVEMENTS °1. Find a quiet and comfortable area to sit or lie down on your left side. Lying on your left side provides the best blood and oxygen circulation to your baby. °2. Write down the day and time on a sheet of paper or in a journal. °3. Start counting kicks, flutters, swishes, rolls, or jabs in a 2-hour period. You should feel at least 10 movements within 2 hours. °4. If you do not feel 10 movements in 2 hours, wait 2-3 hours and count again. Look for a change in the pattern or not enough counts in 2 hours. °SEEK MEDICAL CARE IF: °· You feel less than 10 counts in 2 hours, tried  twice. °· There is no movement in over an hour. °· The pattern is changing or taking longer each day to reach 10 counts in 2 hours. °· You feel the baby is not moving as he or she usually does. °Date: ____________ Movements: ____________ Start time: ____________ Finish time: ____________  °Date: ____________ Movements: ____________ Start time: ____________ Finish time: ____________ °Date: ____________ Movements: ____________ Start time: ____________ Finish time: ____________ °Date: ____________ Movements: ____________ Start time: ____________ Finish time: ____________ °Date: ____________ Movements: ____________ Start time: ____________ Finish time: ____________ °Date: ____________ Movements: ____________ Start time: ____________ Finish time: ____________ °Date: ____________ Movements: ____________ Start time: ____________ Finish time: ____________ °Date: ____________ Movements: ____________ Start time: ____________ Finish time: ____________  °Date: ____________ Movements: ____________ Start time: ____________ Finish time: ____________ °Date: ____________ Movements: ____________ Start time: ____________ Finish time: ____________ °Date: ____________ Movements: ____________ Start time: ____________ Finish time: ____________ °Date: ____________ Movements: ____________ Start time: ____________ Finish time: ____________ °Date: ____________ Movements: ____________ Start time: ____________ Finish time: ____________ °Date: ____________ Movements: ____________ Start time: ____________ Finish time: ____________ °Date: ____________ Movements: ____________ Start time: ____________ Finish time: ____________  °Date: ____________ Movements: ____________ Start time: ____________ Finish time: ____________ °Date: ____________ Movements: ____________ Start time: ____________ Finish time: ____________ °Date: ____________ Movements: ____________ Start time: ____________ Finish time: ____________ °Date: ____________ Movements:  ____________ Start time: ____________ Finish time: ____________ °Date: ____________ Movements: ____________ Start time:   ____________ Finish time: ____________ °Date: ____________ Movements: ____________ Start time: ____________ Finish time: ____________ °Date: ____________ Movements: ____________ Start time: ____________ Finish time: ____________  °Date: ____________ Movements: ____________ Start time: ____________ Finish time: ____________ °Date: ____________ Movements: ____________ Start time: ____________ Finish time: ____________ °Date: ____________ Movements: ____________ Start time: ____________ Finish time: ____________ °Date: ____________ Movements: ____________ Start time: ____________ Finish time: ____________ °Date: ____________ Movements: ____________ Start time: ____________ Finish time: ____________ °Date: ____________ Movements: ____________ Start time: ____________ Finish time: ____________ °Date: ____________ Movements: ____________ Start time: ____________ Finish time: ____________  °Date: ____________ Movements: ____________ Start time: ____________ Finish time: ____________ °Date: ____________ Movements: ____________ Start time: ____________ Finish time: ____________ °Date: ____________ Movements: ____________ Start time: ____________ Finish time: ____________ °Date: ____________ Movements: ____________ Start time: ____________ Finish time: ____________ °Date: ____________ Movements: ____________ Start time: ____________ Finish time: ____________ °Date: ____________ Movements: ____________ Start time: ____________ Finish time: ____________ °Date: ____________ Movements: ____________ Start time: ____________ Finish time: ____________  °Date: ____________ Movements: ____________ Start time: ____________ Finish time: ____________ °Date: ____________ Movements: ____________ Start time: ____________ Finish time: ____________ °Date: ____________ Movements: ____________ Start time: ____________ Finish  time: ____________ °Date: ____________ Movements: ____________ Start time: ____________ Finish time: ____________ °Date: ____________ Movements: ____________ Start time: ____________ Finish time: ____________ °Date: ____________ Movements: ____________ Start time: ____________ Finish time: ____________ °Date: ____________ Movements: ____________ Start time: ____________ Finish time: ____________  °Date: ____________ Movements: ____________ Start time: ____________ Finish time: ____________ °Date: ____________ Movements: ____________ Start time: ____________ Finish time: ____________ °Date: ____________ Movements: ____________ Start time: ____________ Finish time: ____________ °Date: ____________ Movements: ____________ Start time: ____________ Finish time: ____________ °Date: ____________ Movements: ____________ Start time: ____________ Finish time: ____________ °Date: ____________ Movements: ____________ Start time: ____________ Finish time: ____________ °Date: ____________ Movements: ____________ Start time: ____________ Finish time: ____________  °Date: ____________ Movements: ____________ Start time: ____________ Finish time: ____________ °Date: ____________ Movements: ____________ Start time: ____________ Finish time: ____________ °Date: ____________ Movements: ____________ Start time: ____________ Finish time: ____________ °Date: ____________ Movements: ____________ Start time: ____________ Finish time: ____________ °Date: ____________ Movements: ____________ Start time: ____________ Finish time: ____________ °Date: ____________ Movements: ____________ Start time: ____________ Finish time: ____________ °  °This information is not intended to replace advice given to you by your health care provider. Make sure you discuss any questions you have with your health care provider. °  °Document Released: 11/25/2006 Document Revised: 11/16/2014 Document Reviewed: 08/22/2012 °Elsevier Interactive Patient Education ©2016  [employer] ° °

## 2016-01-28 LAB — OB RESULTS CONSOLE GBS: STREP GROUP B AG: POSITIVE

## 2016-01-29 ENCOUNTER — Other Ambulatory Visit: Payer: Self-pay | Admitting: Obstetrics

## 2016-01-29 LAB — NUSWAB VAGINITIS PLUS (VG+)
CANDIDA ALBICANS, NAA: NEGATIVE
Candida glabrata, NAA: NEGATIVE
Chlamydia trachomatis, NAA: NEGATIVE
Neisseria gonorrhoeae, NAA: NEGATIVE
TRICH VAG BY NAA: NEGATIVE

## 2016-01-29 LAB — STREP GP B NAA: Strep Gp B NAA: POSITIVE — AB

## 2016-01-30 ENCOUNTER — Encounter: Payer: Self-pay | Admitting: Obstetrics

## 2016-01-30 NOTE — Progress Notes (Signed)
Subjective:    Carrie Nielsen is a 25 y.o. female being seen today for her obstetrical visit. She is at 5876w4d gestation. Patient reports no complaints. Fetal movement: normal.  Problem List Items Addressed This Visit    None    Visit Diagnoses    Supervision of other normal pregnancy, antepartum, third trimester    -  Primary    Relevant Orders    POCT urinalysis dipstick (Completed)    Strep Gp B NAA (Completed)    NuSwab Vaginitis Plus (VG+) (Completed)      Patient Active Problem List   Diagnosis Date Noted  . Chlamydia infection 06/16/2014  . 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 Esophageal reflux 02/08/2013    Objective:    BP 114/72 mmHg  Pulse 102  Temp(Src) 98.1 F (36.7 C)  Wt 209 lb (94.802 kg)  LMP 05/09/2015 FHT: 150 BPM  Uterine Size: size equals dates  Presentations: cephalic  Pelvic Exam:              Dilation: 1cm       Effacement: 50%             Station:  -3    Consistency: medium            Position: posterior     Assessment:    Pregnancy @ 3076w4d weeks   Plan:   Plans for delivery: Vaginal anticipated; labs reviewed; problem list updated Counseling: Consent signed. Infant feeding: plans to breastfeed. Cigarette smoking: never smoked. L&D discussion: symptoms of labor, discussed when to call, discussed what number to call, anesthetic/analgesic options reviewed and delivering clinician:  plans no preference. Postpartum supports and preparation: circumcision discussed and contraception plans discussed.  Follow up in 1 Week.

## 2016-02-03 ENCOUNTER — Other Ambulatory Visit: Payer: Self-pay | Admitting: *Deleted

## 2016-02-03 ENCOUNTER — Other Ambulatory Visit: Payer: Self-pay | Admitting: Certified Nurse Midwife

## 2016-02-03 ENCOUNTER — Encounter: Payer: Medicaid Other | Admitting: Obstetrics

## 2016-02-04 ENCOUNTER — Ambulatory Visit (INDEPENDENT_AMBULATORY_CARE_PROVIDER_SITE_OTHER): Payer: Medicaid Other | Admitting: Obstetrics

## 2016-02-04 ENCOUNTER — Encounter: Payer: Medicaid Other | Admitting: Obstetrics

## 2016-02-04 VITALS — BP 115/74 | HR 104 | Wt 213.0 lb

## 2016-02-04 DIAGNOSIS — Z3483 Encounter for supervision of other normal pregnancy, third trimester: Secondary | ICD-10-CM

## 2016-02-05 ENCOUNTER — Encounter: Payer: Self-pay | Admitting: Obstetrics

## 2016-02-05 NOTE — Progress Notes (Signed)
Subjective:    Carrie Nielsen is a 25 y.o. female being seen today for her obstetrical visit. She is at 2458w3d gestation. Patient reports no complaints. Fetal movement: normal.  Problem List Items Addressed This Visit    None     Patient Active Problem List   Diagnosis Date Noted  . Chlamydia infection 06/16/2014  . 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 Esophageal reflux 02/08/2013    Objective:    BP 115/74 mmHg  Pulse 104  Wt 213 lb (96.616 kg)  LMP 05/09/2015 FHT: 150 BPM  Uterine Size: size equals dates  Presentations: cephalic  Pelvic Exam:              Dilation: 1cm       Effacement: 50%             Station:  -3    Consistency: soft            Position: posterior     Assessment:    Pregnancy @ 3058w3d weeks   Plan:   Plans for delivery: Vaginal anticipated; labs reviewed; problem list updated Counseling: Consent signed. Infant feeding: plans to breastfeed. Cigarette smoking: never smoked. L&D discussion: symptoms of labor, discussed when to call, discussed what number to call, anesthetic/analgesic options reviewed and delivering clinician:  plans no preference. Postpartum supports and preparation: circumcision discussed and contraception plans discussed.

## 2016-02-06 ENCOUNTER — Telehealth (HOSPITAL_COMMUNITY): Payer: Self-pay | Admitting: *Deleted

## 2016-02-06 NOTE — Telephone Encounter (Signed)
Preadmission screen  

## 2016-02-10 ENCOUNTER — Inpatient Hospital Stay (HOSPITAL_COMMUNITY): Payer: Medicaid Other

## 2016-02-11 ENCOUNTER — Inpatient Hospital Stay (HOSPITAL_COMMUNITY): Payer: Medicaid Other | Admitting: Anesthesiology

## 2016-02-11 ENCOUNTER — Inpatient Hospital Stay (HOSPITAL_COMMUNITY)
Admission: RE | Admit: 2016-02-11 | Discharge: 2016-02-13 | DRG: 775 | Disposition: A | Discharge status: Home / Self Care | Payer: Medicaid Other | Source: Ambulatory Visit | Attending: Obstetrics | Admitting: Obstetrics

## 2016-02-11 ENCOUNTER — Encounter (HOSPITAL_COMMUNITY): Payer: Self-pay

## 2016-02-11 DIAGNOSIS — O480 Post-term pregnancy: Secondary | ICD-10-CM | POA: Diagnosis present

## 2016-02-11 DIAGNOSIS — Z8759 Personal history of other complications of pregnancy, childbirth and the puerperium: Secondary | ICD-10-CM

## 2016-02-11 DIAGNOSIS — Z3A41 41 weeks gestation of pregnancy: Secondary | ICD-10-CM | POA: Diagnosis not present

## 2016-02-11 LAB — ABO/RH: ABO/RH(D): O POS

## 2016-02-11 LAB — TYPE AND SCREEN
ABO/RH(D): O POS
Antibody Screen: NEGATIVE

## 2016-02-11 LAB — CBC
HCT: 30.4 % — ABNORMAL LOW (ref 36.0–46.0)
HEMOGLOBIN: 9.7 g/dL — AB (ref 12.0–15.0)
MCH: 24.9 pg — AB (ref 26.0–34.0)
MCHC: 31.9 g/dL (ref 30.0–36.0)
MCV: 78.1 fL (ref 78.0–100.0)
Platelets: 307 10*3/uL (ref 150–400)
RBC: 3.89 MIL/uL (ref 3.87–5.11)
RDW: 16.6 % — ABNORMAL HIGH (ref 11.5–15.5)
WBC: 14 10*3/uL — ABNORMAL HIGH (ref 4.0–10.5)

## 2016-02-11 LAB — RPR: RPR Ser Ql: NONREACTIVE

## 2016-02-11 MED ORDER — TETANUS-DIPHTH-ACELL PERTUSSIS 5-2.5-18.5 LF-MCG/0.5 IM SUSP: 0.5000 mL | Freq: Once | INTRAMUSCULAR | Status: DC

## 2016-02-11 MED ORDER — ONDANSETRON HCL 4 MG/2ML IJ SOLN: 4.0000 mg | Freq: Four times a day (QID) | INTRAMUSCULAR | Status: DC | PRN

## 2016-02-11 MED ORDER — TERBUTALINE SULFATE 1 MG/ML IJ SOLN
0.2500 mg | Freq: Once | INTRAMUSCULAR | Status: AC | PRN
  Administered 2016-02-11: 0.25 mg via SUBCUTANEOUS
  Filled 2016-02-11: qty 1

## 2016-02-11 MED ORDER — WITCH HAZEL-GLYCERIN EX PADS: 1.0000 "application " | MEDICATED_PAD | CUTANEOUS | Status: DC | PRN

## 2016-02-11 MED ORDER — METHYLERGONOVINE MALEATE 0.2 MG PO TABS: 0.2000 mg | ORAL_TABLET | ORAL | Status: DC | PRN

## 2016-02-11 MED ORDER — ZOLPIDEM TARTRATE 5 MG PO TABS: 5.0000 mg | ORAL_TABLET | Freq: Every evening | ORAL | Status: DC | PRN

## 2016-02-11 MED ORDER — ONDANSETRON HCL 4 MG/2ML IJ SOLN: 4.0000 mg | INTRAMUSCULAR | Status: DC | PRN

## 2016-02-11 MED ORDER — LACTATED RINGERS IV SOLN
500.0000 mL | Freq: Once | INTRAVENOUS | Status: AC
  Administered 2016-02-11: 500 mL via INTRAVENOUS

## 2016-02-11 MED ORDER — LACTATED RINGERS IV SOLN
INTRAVENOUS | Status: DC
  Administered 2016-02-11: 16:00:00 via INTRAUTERINE

## 2016-02-11 MED ORDER — OXYCODONE-ACETAMINOPHEN 5-325 MG PO TABS: 2.0000 | ORAL_TABLET | ORAL | Status: DC | PRN

## 2016-02-11 MED ORDER — MEDROXYPROGESTERONE ACETATE 150 MG/ML IM SUSP: 150.0000 mg | INTRAMUSCULAR | Status: DC | PRN

## 2016-02-11 MED ORDER — LANOLIN HYDROUS EX OINT: TOPICAL_OINTMENT | CUTANEOUS | Status: DC | PRN

## 2016-02-11 MED ORDER — LACTATED RINGERS IV SOLN
INTRAVENOUS | Status: DC
  Administered 2016-02-11 (×2): via INTRAVENOUS

## 2016-02-11 MED ORDER — EPHEDRINE 5 MG/ML INJ
10.0000 mg | INTRAVENOUS | Status: DC | PRN
  Filled 2016-02-11: qty 2

## 2016-02-11 MED ORDER — DIBUCAINE 1 % RE OINT: 1.0000 "application " | TOPICAL_OINTMENT | RECTAL | Status: DC | PRN

## 2016-02-11 MED ORDER — LIDOCAINE HCL (PF) 1 % IJ SOLN
30.0000 mL | INTRAMUSCULAR | Status: DC | PRN
  Filled 2016-02-11: qty 30

## 2016-02-11 MED ORDER — OXYTOCIN BOLUS FROM INFUSION
500.0000 mL | INTRAVENOUS | Status: DC
  Administered 2016-02-11: 500 mL via INTRAVENOUS

## 2016-02-11 MED ORDER — CITRIC ACID-SODIUM CITRATE 334-500 MG/5ML PO SOLN
30.0000 mL | ORAL | Status: DC | PRN
  Filled 2016-02-11: qty 15

## 2016-02-11 MED ORDER — ONDANSETRON HCL 4 MG PO TABS: 4.0000 mg | ORAL_TABLET | ORAL | Status: DC | PRN

## 2016-02-11 MED ORDER — LIDOCAINE HCL (PF) 1 % IJ SOLN
INTRAMUSCULAR | Status: DC | PRN
  Administered 2016-02-11 (×2): 5 mL via EPIDURAL

## 2016-02-11 MED ORDER — SIMETHICONE 80 MG PO CHEW: 80.0000 mg | CHEWABLE_TABLET | ORAL | Status: DC | PRN

## 2016-02-11 MED ORDER — SENNOSIDES-DOCUSATE SODIUM 8.6-50 MG PO TABS
2.0000 | ORAL_TABLET | ORAL | Status: DC
  Administered 2016-02-12 (×2): 2 via ORAL
  Filled 2016-02-11 (×2): qty 2

## 2016-02-11 MED ORDER — LACTATED RINGERS IV SOLN
500.0000 mL | INTRAVENOUS | Status: DC | PRN
  Administered 2016-02-11: 500 mL via INTRAVENOUS

## 2016-02-11 MED ORDER — HYDROXYZINE HCL 50 MG PO TABS
50.0000 mg | ORAL_TABLET | Freq: Four times a day (QID) | ORAL | Status: DC | PRN
  Filled 2016-02-11: qty 1

## 2016-02-11 MED ORDER — OXYTOCIN 10 UNIT/ML IJ SOLN
2.5000 [IU]/h | INTRAVENOUS | Status: DC
  Administered 2016-02-11: 2.5 [IU]/h via INTRAVENOUS
  Filled 2016-02-11: qty 4

## 2016-02-11 MED ORDER — PHENYLEPHRINE 40 MCG/ML (10ML) SYRINGE FOR IV PUSH (FOR BLOOD PRESSURE SUPPORT)
80.0000 ug | PREFILLED_SYRINGE | INTRAVENOUS | Status: DC | PRN
  Filled 2016-02-11: qty 2
  Filled 2016-02-11: qty 20

## 2016-02-11 MED ORDER — BUTORPHANOL TARTRATE 1 MG/ML IJ SOLN
1.0000 mg | INTRAMUSCULAR | Status: DC | PRN
  Administered 2016-02-11 (×2): 1 mg via INTRAVENOUS
  Filled 2016-02-11 (×2): qty 1

## 2016-02-11 MED ORDER — METHYLERGONOVINE MALEATE 0.2 MG/ML IJ SOLN: 0.2000 mg | INTRAMUSCULAR | Status: DC | PRN

## 2016-02-11 MED ORDER — FLEET ENEMA 7-19 GM/118ML RE ENEM: 1.0000 | ENEMA | Freq: Every day | RECTAL | Status: DC | PRN

## 2016-02-11 MED ORDER — DIPHENHYDRAMINE HCL 25 MG PO CAPS: 25.0000 mg | ORAL_CAPSULE | Freq: Four times a day (QID) | ORAL | Status: DC | PRN

## 2016-02-11 MED ORDER — PROPOFOL 10 MG/ML IV BOLUS
INTRAVENOUS | Status: AC
  Filled 2016-02-11: qty 20

## 2016-02-11 MED ORDER — OXYCODONE-ACETAMINOPHEN 5-325 MG PO TABS
2.0000 | ORAL_TABLET | ORAL | Status: DC | PRN
Start: 2016-02-11 — End: 2016-02-11

## 2016-02-11 MED ORDER — OXYTOCIN 10 UNIT/ML IJ SOLN: 2.5000 [IU]/h | INTRAVENOUS | Status: DC | PRN

## 2016-02-11 MED ORDER — FERROUS SULFATE 325 (65 FE) MG PO TABS
325.0000 mg | ORAL_TABLET | Freq: Two times a day (BID) | ORAL | Status: DC
  Administered 2016-02-12 – 2016-02-13 (×3): 325 mg via ORAL
  Filled 2016-02-11 (×3): qty 1

## 2016-02-11 MED ORDER — OXYCODONE-ACETAMINOPHEN 5-325 MG PO TABS: 1.0000 | ORAL_TABLET | ORAL | Status: DC | PRN

## 2016-02-11 MED ORDER — PENICILLIN G POTASSIUM 5000000 UNITS IJ SOLR
2.5000 10*6.[IU] | INTRAVENOUS | Status: DC
  Administered 2016-02-11 (×3): 2.5 10*6.[IU] via INTRAVENOUS
  Filled 2016-02-11 (×6): qty 2.5

## 2016-02-11 MED ORDER — PRENATAL MULTIVITAMIN CH
1.0000 | ORAL_TABLET | Freq: Every day | ORAL | Status: DC
  Administered 2016-02-12 – 2016-02-13 (×2): 1 via ORAL
  Filled 2016-02-11 (×2): qty 1

## 2016-02-11 MED ORDER — ACETAMINOPHEN 325 MG PO TABS: 650.0000 mg | ORAL_TABLET | ORAL | Status: DC | PRN

## 2016-02-11 MED ORDER — OXYCODONE-ACETAMINOPHEN 5-325 MG PO TABS
1.0000 | ORAL_TABLET | ORAL | Status: DC | PRN
Start: 2016-02-11 — End: 2016-02-13
  Administered 2016-02-11 – 2016-02-13 (×5): 1 via ORAL
  Filled 2016-02-11 (×5): qty 1

## 2016-02-11 MED ORDER — BISACODYL 10 MG RE SUPP: 10.0000 mg | Freq: Every day | RECTAL | Status: DC | PRN

## 2016-02-11 MED ORDER — MEASLES, MUMPS & RUBELLA VAC ~~LOC~~ INJ: 0.5000 mL | INJECTION | Freq: Once | SUBCUTANEOUS | Status: DC

## 2016-02-11 MED ORDER — IBUPROFEN 600 MG PO TABS
600.0000 mg | ORAL_TABLET | Freq: Four times a day (QID) | ORAL | Status: DC
  Administered 2016-02-11 – 2016-02-13 (×7): 600 mg via ORAL
  Filled 2016-02-11 (×8): qty 1

## 2016-02-11 MED ORDER — ACETAMINOPHEN 325 MG PO TABS
650.0000 mg | ORAL_TABLET | ORAL | Status: DC | PRN
  Filled 2016-02-11: qty 2

## 2016-02-11 MED ORDER — PENICILLIN G POTASSIUM 5000000 UNITS IJ SOLR
5.0000 10*6.[IU] | Freq: Once | INTRAVENOUS | Status: AC
  Administered 2016-02-11: 5 10*6.[IU] via INTRAVENOUS
  Filled 2016-02-11: qty 5

## 2016-02-11 MED ORDER — FENTANYL 2.5 MCG/ML BUPIVACAINE 1/10 % EPIDURAL INFUSION (WH - ANES)
14.0000 mL/h | INTRAMUSCULAR | Status: DC | PRN
  Administered 2016-02-11: 15 mL/h via EPIDURAL
  Filled 2016-02-11: qty 125

## 2016-02-11 MED ORDER — MISOPROSTOL 200 MCG PO TABS
50.0000 ug | ORAL_TABLET | ORAL | Status: DC
Start: 2016-02-11 — End: 2016-02-11
  Administered 2016-02-11: 50 ug via ORAL
  Filled 2016-02-11: qty 0.5

## 2016-02-11 MED ORDER — DIPHENHYDRAMINE HCL 50 MG/ML IJ SOLN: 12.5000 mg | INTRAMUSCULAR | Status: DC | PRN

## 2016-02-11 MED ORDER — PHENYLEPHRINE 40 MCG/ML (10ML) SYRINGE FOR IV PUSH (FOR BLOOD PRESSURE SUPPORT)
80.0000 ug | PREFILLED_SYRINGE | INTRAVENOUS | Status: DC | PRN
  Filled 2016-02-11: qty 2

## 2016-02-11 MED ORDER — MISOPROSTOL 25 MCG QUARTER TABLET
25.0000 ug | ORAL_TABLET | ORAL | Status: DC | PRN
  Administered 2016-02-11 (×3): 25 ug via VAGINAL
  Filled 2016-02-11: qty 1
  Filled 2016-02-11 (×3): qty 0.25

## 2016-02-11 MED ORDER — BENZOCAINE-MENTHOL 20-0.5 % EX AERO
1.0000 "application " | INHALATION_SPRAY | CUTANEOUS | Status: DC | PRN
  Administered 2016-02-12: 1 via TOPICAL
  Filled 2016-02-11: qty 56

## 2016-02-11 NOTE — Anesthesia Procedure Notes (Signed)
Epidural Patient location during procedure: OB Start time: 02/11/2016 1:10 PM  Staffing Anesthesiologist: Mal AmabileFOSTER, Quintana Canelo Performed by: anesthesiologist   Preanesthetic Checklist Completed: patient identified, site marked, surgical consent, pre-op evaluation, timeout performed, IV checked, risks and benefits discussed and monitors and equipment checked  Epidural Patient position: sitting Prep: site prepped and draped and DuraPrep Patient monitoring: continuous pulse ox and blood pressure Approach: midline Location: L4-L5 Injection technique: LOR air  Needle:  Needle type: Tuohy  Needle gauge: 17 G Needle length: 9 cm and 9 Needle insertion depth: 7 cm Catheter type: closed end flexible Catheter size: 19 Gauge Catheter at skin depth: 12 cm Test dose: negative and Other  Assessment Events: blood not aspirated, injection not painful, no injection resistance, negative IV test and no paresthesia  Additional Notes Patient identified. Risks and benefits discussed including failed block, incomplete  Pain control, post dural puncture headache, nerve damage, paralysis, blood pressure Changes, nausea, vomiting, reactions to medications-both toxic and allergic and post Partum back pain. All questions were answered. Patient expressed understanding and wished to proceed. Sterile technique was used throughout procedure. Epidural site was Dressed with sterile barrier dressing. No paresthesias, signs of intravascular injection Or signs of intrathecal spread were encountered.  Patient was more comfortable after the epidural was dosed. Please see RN's note for documentation of vital signs and FHR which are stable.

## 2016-02-11 NOTE — H&P (Signed)
Carrie Nielsen is a 25 y.o. female presenting for IOL for postdates. Maternal Medical History:  Fetal activity: Perceived fetal activity is normal.   Last perceived fetal movement was within the past hour.    Prenatal complications: no prenatal complications Prenatal Complications - Diabetes: none.    OB History    Gravida Para Term Preterm AB TAB SAB Ectopic Multiple Living   2 1 1       1      Past Medical History  Diagnosis Date  . History of cold sores   . Medical history non-contributory    Past Surgical History  Procedure Laterality Date  . Wisdom tooth extraction    . No past surgeries     Family History: family history includes Cancer in her maternal grandfather. There is no history of Alcohol abuse, Arthritis, Asthma, Birth defects, COPD, Depression, Diabetes, Early death, Drug abuse, Hearing loss, Heart disease, Hyperlipidemia, Hypertension, Kidney disease, Learning disabilities, Mental illness, Mental retardation, Miscarriages / Stillbirths, Stroke, or Vision loss. Social History:  reports that she has never smoked. She has never used smokeless tobacco. She reports that she does not drink alcohol or use illicit drugs.   Prenatal Transfer Tool  Maternal Diabetes: No Genetic Screening: Normal Maternal Ultrasounds/Referrals: Normal Fetal Ultrasounds or other Referrals:  None Maternal Substance Abuse:  No Significant Maternal Medications:  None Significant Maternal Lab Results:  Lab values include: Group B Strep positive Other Comments:  None  Review of Systems  All other systems reviewed and are negative.   Dilation: 1 Effacement (%): 50 Station: -3 Exam by:: D Jasso, RN Blood pressure 108/51, pulse 87, temperature 97.9 F (36.6 C), temperature source Oral, resp. rate 16, height 5\' 10"  (1.778 m), weight 220 lb (99.791 kg), last menstrual period 05/09/2015. Maternal Exam:  Abdomen: Patient reports no abdominal tenderness. Fetal presentation:  vertex  Introitus: Normal vulva. Normal vagina.  Cervix: Cervix evaluated by digital exam.     Physical Exam  Nursing note and vitals reviewed. Constitutional: She is oriented to person, place, and time. She appears well-developed and well-nourished.  HENT:  Head: Normocephalic and atraumatic.  Eyes: Conjunctivae are normal. Pupils are equal, round, and reactive to light.  Neck: Normal range of motion. Neck supple.  Cardiovascular: Normal rate and regular rhythm.   Respiratory: Effort normal and breath sounds normal.  GI: Soft. Bowel sounds are normal.  Genitourinary: Vagina normal and uterus normal.  Musculoskeletal: Normal range of motion.  Neurological: She is alert and oriented to person, place, and time.  Skin: Skin is warm and dry.  Psychiatric: She has a normal mood and affect. Her behavior is normal. Judgment and thought content normal.    Prenatal labs: ABO, Rh: --/--/O POS, O POS (04/04 0105) Antibody: NEG (04/04 0105) Rubella: 1.35 (10/13 1657) RPR: NON REAC (10/13 1657)  HBsAg: NEGATIVE (10/13 1657)  HIV: NONREACTIVE (10/13 1657)  GBS: Positive (03/21 0000)   Assessment/Plan: 41 weeks. 2 stage IOL.   HARPER,CHARLES A 02/11/2016, 5:31 AM

## 2016-02-11 NOTE — Anesthesia Preprocedure Evaluation (Signed)
Anesthesia Evaluation  Patient identified by MRN, date of birth, ID band Patient awake    Reviewed: Allergy & Precautions, Patient's Chart, lab work & pertinent test results  Airway Mallampati: II  TM Distance: >3 FB Neck ROM: Full    Dental no notable dental hx. (+) Teeth Intact   Pulmonary neg pulmonary ROS,    Pulmonary exam normal breath sounds clear to auscultation       Cardiovascular negative cardio ROS Normal cardiovascular exam Rhythm:Regular Rate:Normal     Neuro/Psych negative neurological ROS  negative psych ROS   GI/Hepatic Neg liver ROS, GERD  Medicated and Controlled,  Endo/Other  Obesity  Renal/GU negative Renal ROS  negative genitourinary   Musculoskeletal   Abdominal (+) + obese,   Peds  Hematology  (+) anemia ,   Anesthesia Other Findings   Reproductive/Obstetrics (+) Pregnancy 40 5/7 weeks                             Anesthesia Physical Anesthesia Plan  ASA: II  Anesthesia Plan: Epidural   Post-op Pain Management:    Induction:   Airway Management Planned: Natural Airway  Additional Equipment:   Intra-op Plan:   Post-operative Plan:   Informed Consent: I have reviewed the patients History and Physical, chart, labs and discussed the procedure including the risks, benefits and alternatives for the proposed anesthesia with the patient or authorized representative who has indicated his/her understanding and acceptance.     Plan Discussed with: Anesthesiologist  Anesthesia Plan Comments:         Anesthesia Quick Evaluation

## 2016-02-12 LAB — CBC
HCT: 29 % — ABNORMAL LOW (ref 36.0–46.0)
Hemoglobin: 9.3 g/dL — ABNORMAL LOW (ref 12.0–15.0)
MCH: 25.2 pg — ABNORMAL LOW (ref 26.0–34.0)
MCHC: 32.1 g/dL (ref 30.0–36.0)
MCV: 78.6 fL (ref 78.0–100.0)
PLATELETS: 271 10*3/uL (ref 150–400)
RBC: 3.69 MIL/uL — AB (ref 3.87–5.11)
RDW: 16.6 % — ABNORMAL HIGH (ref 11.5–15.5)
WBC: 15 10*3/uL — ABNORMAL HIGH (ref 4.0–10.5)

## 2016-02-12 NOTE — Progress Notes (Signed)
[facility]; however, CSW screening out referral at this time.   MOB initiated care at 17 weeks and 2 days. She attended a total of 7 prenatal appointments.  [facility] as [facility] initiated care prior to 28 weeks and had more than 3 appointments. CSW did not identify any additional psychosocial stressors that would indicate a need for CSW consult.   Re-consult CSW if needs arise or upon MOB request.

## 2016-02-12 NOTE — Progress Notes (Signed)
Post Partum Day #1 Subjective: up ad lib, voiding, tolerating PO and reports some burning with urination.  Objective: Blood pressure 123/66, pulse 72, temperature 97.9 F (36.6 C), temperature source Oral, resp. rate 18, height 5\' 10"  (1.778 m), weight 220 lb (99.791 kg), last menstrual period 05/09/2015, SpO2 100 %, unknown if currently breastfeeding.  Physical Exam:  General: alert, cooperative and no distress Lochia: appropriate Uterine Fundus: firm Incision: no significant drainage DVT Evaluation: No evidence of DVT seen on physical exam. No cords or calf tenderness. No significant calf/ankle edema.   Recent Labs  02/11/16 0105 02/12/16 0534  HGB 9.7* 9.3*  HCT 30.4* 29.0*    Assessment/Plan: Plan for discharge tomorrow, Breastfeeding and Lactation consult   LOS: 1 day   Carrie Nielsen 02/12/2016, 9:02 AM

## 2016-02-12 NOTE — Anesthesia Postprocedure Evaluation (Signed)
Anesthesia Post Note  Patient: Carrie Nielsen  Procedure(s) Performed: * No procedures listed *  Patient location during evaluation: Mother Baby Anesthesia Type: Epidural Level of consciousness: awake and alert, oriented and awake Pain management: pain level controlled Vital Signs Assessment: post-procedure vital signs reviewed and stable Respiratory status: spontaneous breathing, nonlabored ventilation and respiratory function stable Cardiovascular status: stable Postop Assessment: no headache, patient able to bend at knees, no signs of nausea or vomiting and adequate PO intake (Some back discomfort. Just received ibuprofen) Anesthetic complications: no    Last Vitals:  Filed Vitals:   02/11/16 1945 02/12/16 0011  BP: 127/75 121/70  Pulse: 84 72  Temp: 36.8 C 36.8 C  Resp: 18 18    Last Pain:  Filed Vitals:   02/12/16 0402  PainSc: 10-Worst pain ever                 Nasrin Lanzo

## 2016-02-12 NOTE — Lactation Note (Signed)
This note was copied from a baby's chart. Lactation Consultation Note 2nd baby for mom, but didn't BF her 1st child. Mom states she has been watching videos. Denies questions or difficulty. States she can tell when baby isn't on breast right. Instructed to pull chin down to widen flange. Mom encouraged to feed baby 8-12 times/24 hours and with feeding cues. Reviewed Baby & Me book's Breastfeeding Basics.  Educated about newborn behavior, STS,I&O, supply and demand. WH/LC brochure given w/resources, support groups and LC services. Patient Name: Carrie Nielsen [id number]'s Date: 02/12/2016 Reason for consult: Initial assessment   Maternal Data    Feeding    LATCH Score/Interventions       Type of Nipple: Everted at rest and after stimulation  Comfort (Breast/Nipple): Soft / non-tender     Intervention(s): Skin to skin;Position options;Support Pillows;Breastfeeding basics reviewed     Lactation Tools Discussed/Used WIC Program: Yes   Consult Status Consult Status: Follow-up Date: 02/12/16 Follow-up type: In-patient    Charyl DancerCARVER, Hassani Sliney G 02/12/2016, 4:49 AM

## 2016-02-12 NOTE — Progress Notes (Signed)
UR chart review completed.  

## 2016-02-12 NOTE — Anesthesia Postprocedure Evaluation (Deleted)
Anesthesia Post Note  Patient: Carrie Nielsen  Procedure(s) Performed: * No procedures listed *  Patient location during evaluation: Mother Baby Anesthesia Type: Epidural Level of consciousness: awake, awake and alert and oriented Pain management: pain level controlled Vital Signs Assessment: post-procedure vital signs reviewed and stable Respiratory status: spontaneous breathing, nonlabored ventilation and respiratory function stable Cardiovascular status: stable Postop Assessment: no headache, no backache, patient able to bend at knees, no signs of nausea or vomiting and adequate PO intake Anesthetic complications: no    Last Vitals:  Filed Vitals:   02/11/16 1945 02/12/16 0011  BP: 127/75 121/70  Pulse: 84 72  Temp: 36.8 C 36.8 C  Resp: 18 18    Last Pain:  Filed Vitals:   02/12/16 0402  PainSc: 10-Worst pain ever                 Rylynn Schoneman

## 2016-02-13 MED ORDER — DIBUCAINE 1 % RE OINT: 1.0000 "application " | TOPICAL_OINTMENT | RECTAL | Status: DC | PRN

## 2016-02-13 MED ORDER — BENZOCAINE-MENTHOL 20-0.5 % EX AERO: 1.0000 "application " | INHALATION_SPRAY | Freq: Three times a day (TID) | CUTANEOUS | Status: DC | PRN

## 2016-02-13 MED ORDER — SENNOSIDES-DOCUSATE SODIUM 8.6-50 MG PO TABS: 2.0000 | ORAL_TABLET | Freq: Every day | ORAL | Status: DC

## 2016-02-13 MED ORDER — OXYCODONE-ACETAMINOPHEN 5-325 MG PO TABS: 2.0000 | ORAL_TABLET | ORAL | Status: DC | PRN

## 2016-02-13 MED ORDER — IBUPROFEN 800 MG PO TABS: 800.0000 mg | ORAL_TABLET | Freq: Three times a day (TID) | ORAL | Status: DC | PRN

## 2016-02-13 MED ORDER — VITAFOL FE+ 90-1-200 & 50 MG PO CPPK: 2.0000 | ORAL_CAPSULE | Freq: Every day | ORAL | Status: DC

## 2016-02-13 MED ORDER — BISACODYL 10 MG RE SUPP: 10.0000 mg | Freq: Every day | RECTAL | Status: DC | PRN

## 2016-02-13 MED ORDER — WITCH HAZEL-GLYCERIN EX PADS: 1.0000 "application " | MEDICATED_PAD | CUTANEOUS | Status: DC | PRN

## 2016-02-13 NOTE — Lactation Note (Signed)
This note was copied from a baby's chart. Lactation Consultation Note  Patient Name: Carrie Nielsen PoliShequan [name] Date: 02/13/2016 Reason for consult: Follow-up assessment    With this term baby, now 3940 hours old. Mom reports latching painful, and she has some scabs on her right nipple. I asked mom to show me how she latches. She was holding her areola, right behind her nipple, to place it in baby's mouth, and sitting forward, no pillow support. I had mom sit back, positioned pillows for cross cradle hold, and had her b ring the baby to her with a wide mouth, and showed her how to obtain a deep latch. He latched easily this way, very deep, with strong, rhythmic suckles and swallows. Mom reports her nipples are still tender, and I believe this is from previous shallow latches. I gave mom comfort gels and instructed her in their use and care.  Maternal Data    Feeding Feeding Type: Breast Fed Length of feed: 20 min  LATCH Score/Interventions Latch: Grasps breast easily, tongue down, lips flanged, rhythmical sucking. Intervention(s): Adjust position;Assist with latch  Audible Swallowing: Spontaneous and intermittent  Type of Nipple: Everted at rest and after stimulation  Comfort (Breast/Nipple): Filling, red/small blisters or bruises, mild/mod discomfort  Problem noted: Mild/Moderate discomfort;Cracked, bleeding, blisters, bruises (scabs on tip of right, left intact, both tender) Interventions  (Cracked/bleeding/bruising/blister): Expressed breast milk to nipple Interventions (Mild/moderate discomfort): Comfort gels  Hold (Positioning): Assistance needed to correctly position infant at breast and maintain latch. Intervention(s): Breastfeeding basics reviewed;Support Pillows;Position options  LATCH Score: 8  Lactation Tools Discussed/Used     Consult Status Consult Status: Complete Follow-up type: Call as needed    Alfred LevinsLee, Errica Dutil Anne 02/13/2016, 8:45 AM

## 2016-02-13 NOTE — Discharge Summary (Signed)
Obstetric Discharge Summary Reason for Admission: induction of labor Prenatal Procedures: NST and ultrasound Intrapartum Procedures: vacuum Postpartum Procedures: none Complications-Operative and Postpartum: 1st degree perineal laceration HEMOGLOBIN  Date Value Ref Range Status  02/12/2016 9.3* 12.0 - 15.0 g/dL Final  19/14/782902/03/2013 56.212.6 g/dL Final   HCT  Date Value Ref Range Status  02/12/2016 29.0* 36.0 - 46.0 % Final  12/14/2012 38 % Final    Physical Exam:  General: alert, cooperative and no distress Lochia: appropriate Uterine Fundus: firm Incision: no significant drainage DVT Evaluation: No evidence of DVT seen on physical exam. No cords or calf tenderness. No significant calf/ankle edema.  Discharge Diagnoses: Term Pregnancy-delivered  Discharge Information: Date: 02/13/2016 Activity: pelvic rest Diet: routine Medications: PNV, Ibuprofen, Colace, Iron and Percocet Condition: stable Instructions: refer to practice specific booklet Discharge to: home   Newborn Data: Live born female  Birth Weight: 8 lb 7.3 oz (3835 g) APGAR: 8, 9  Home with mother.  Roe Coombsachelle A Cobie Leidner, CNM 02/13/2016, 7:58 AM

## 2016-02-26 ENCOUNTER — Ambulatory Visit: Payer: Medicaid Other | Admitting: Obstetrics

## 2016-03-01 ENCOUNTER — Emergency Department (HOSPITAL_COMMUNITY)
Admission: EM | Admit: 2016-03-01 | Discharge: 2016-03-01 | Disposition: A | Discharge status: Home / Self Care | Payer: Medicaid Other | Attending: Emergency Medicine | Admitting: Emergency Medicine

## 2016-03-01 ENCOUNTER — Encounter (HOSPITAL_COMMUNITY): Payer: Self-pay

## 2016-03-01 DIAGNOSIS — Z79891 Long term (current) use of opiate analgesic: Secondary | ICD-10-CM | POA: Diagnosis not present

## 2016-03-01 DIAGNOSIS — Z791 Long term (current) use of non-steroidal anti-inflammatories (NSAID): Secondary | ICD-10-CM | POA: Diagnosis not present

## 2016-03-01 DIAGNOSIS — Z79899 Other long term (current) drug therapy: Secondary | ICD-10-CM | POA: Diagnosis not present

## 2016-03-01 DIAGNOSIS — J3489 Other specified disorders of nose and nasal sinuses: Secondary | ICD-10-CM | POA: Diagnosis present

## 2016-03-01 DIAGNOSIS — J340 Abscess, furuncle and carbuncle of nose: Secondary | ICD-10-CM | POA: Diagnosis not present

## 2016-03-01 MED ORDER — ACETAMINOPHEN 325 MG PO TABS
650.0000 mg | ORAL_TABLET | Freq: Once | ORAL | Status: AC
  Administered 2016-03-01: 650 mg via ORAL
  Filled 2016-03-01: qty 2

## 2016-03-01 MED ORDER — CEPHALEXIN 500 MG PO CAPS: 1000.0000 mg | ORAL_CAPSULE | Freq: Two times a day (BID) | ORAL | Status: DC

## 2016-03-01 MED ORDER — MUPIROCIN CALCIUM 2 % NA OINT: TOPICAL_OINTMENT | NASAL | Status: DC

## 2016-03-01 MED ORDER — ACETAMINOPHEN 500 MG PO TABS: 500.0000 mg | ORAL_TABLET | Freq: Four times a day (QID) | ORAL | Status: DC | PRN

## 2016-03-01 MED ORDER — LIDOCAINE HCL 2 % IJ SOLN
10.0000 mL | Freq: Once | INTRAMUSCULAR | Status: AC
  Administered 2016-03-01: 200 mg via INTRADERMAL
  Filled 2016-03-01: qty 20

## 2016-03-01 NOTE — ED Notes (Signed)
PT DISCHARGED. INSTRUCTIONS AND PRESCRIPTIONS GIVEN. AAOX3. PT IN NO APPARENT DISTRESS. THE OPPORTUNITY TO ASK QUESTIONS WAS PROVIDED. 

## 2016-03-01 NOTE — Discharge Instructions (Signed)
Apply warm compress to abscess 3-4 times daily to help promote drainage.  Apply mupirocin cream inside nose daily x 7 days.  Take Keflex as prescribed.  Follow up with ENT specialist or your doctor if no improvement.  Abscess An abscess is an infected area that contains a collection of pus and debris.It can occur in almost any part of the body. An abscess is also known as a furuncle or boil. CAUSES  An abscess occurs when tissue gets infected. This can occur from blockage of oil or sweat glands, infection of hair follicles, or a minor injury to the skin. As the body tries to fight the infection, pus collects in the area and creates pressure under the skin. This pressure causes pain. People with weakened immune systems have difficulty fighting infections and get certain abscesses more often.  SYMPTOMS Usually an abscess develops on the skin and becomes a painful mass that is red, warm, and tender. If the abscess forms under the skin, you may feel a moveable soft area under the skin. Some abscesses break open (rupture) on their own, but most will continue to get worse without care. The infection can spread deeper into the body and eventually into the bloodstream, causing you to feel ill.  DIAGNOSIS  Your caregiver will take your medical history and perform a physical exam. A sample of fluid may also be taken from the abscess to determine what is causing your infection. TREATMENT  Your caregiver may prescribe antibiotic medicines to fight the infection. However, taking antibiotics alone usually does not cure an abscess. Your caregiver may need to make a small cut (incision) in the abscess to drain the pus. In some cases, gauze is packed into the abscess to reduce pain and to continue draining the area. HOME CARE INSTRUCTIONS   Only take over-the-counter or prescription medicines for pain, discomfort, or fever as directed by your caregiver.  If you were prescribed antibiotics, take them as directed.  Finish them even if you start to feel better.  If gauze is used, follow your caregiver's directions for changing the gauze.  To avoid spreading the infection:  Keep your draining abscess covered with a bandage.  Wash your hands well.  Do not share personal care items, towels, or whirlpools with others.  Avoid skin contact with others.  Keep your skin and clothes clean around the abscess.  Keep all follow-up appointments as directed by your caregiver. SEEK MEDICAL CARE IF:   You have increased pain, swelling, redness, fluid drainage, or bleeding.  You have muscle aches, chills, or a general ill feeling.  You have a fever. MAKE SURE YOU:   Understand these instructions.  Will watch your condition.  Will get help right away if you are not doing well or get worse.   This information is not intended to replace advice given to you by your health care provider. Make sure you discuss any questions you have with your health care provider.   Document Released: 08/05/2005 Document Revised: 04/26/2012 Document Reviewed: 01/08/2012 Elsevier Interactive Patient Education [employer]2016 [employer] Inc.

## 2016-03-01 NOTE — ED Provider Notes (Signed)
CSN: [id number]     Arrival date & time 03/01/16  1143 History  By signing my name below, I, Carrie Nielsen, attest that this documentation has been prepared under the direction and in the presence of non-physician practitioner, Fayrene Helper, PA-C. Electronically Signed: [name], Scribe. 03/01/2016. 12:11 PM.   Chief Complaint  Patient presents with  . Nose Pain     The history is provided by the patient. No language interpreter was used.     HPI Comments: Carrie Nielsen is a 25 y.o. female with no pertinent PMHx who presents to the Emergency Department complaining of sudden onset, constant, worsening, throbbing, aching, 10/10 right nasal pain due to abscess for the last two days. Pt states that she has an abscess on the inside of her right nostril that is becoming progressively larger; she had a similar problem in the past with her left ear that resolved on its own. Pt endorses difficulty breathing out of her right nostril. She notes that her nasal pain does not radiate. She has not tried any medications for her pain. Pt has no known allergies to medications. She denies ear pain, sore throat, sneezing, coughing, or any other associated symptoms. Pt is UTD for tetanus. Pt is current breast feeding.   Past Medical History  Diagnosis Date  . History of cold sores   . Medical history non-contributory    Past Surgical History  Procedure Laterality Date  . Wisdom tooth extraction    . No past surgeries     Family History  Problem Relation Age of Onset  . Alcohol abuse Neg Hx   . Arthritis Neg Hx   . Asthma Neg Hx   . Birth defects Neg Hx   . COPD Neg Hx   . Depression Neg Hx   . Diabetes Neg Hx   . Early death Neg Hx   . Drug abuse Neg Hx   . Hearing loss Neg Hx   . Heart disease Neg Hx   . Hyperlipidemia Neg Hx   . Hypertension Neg Hx   . Kidney disease Neg Hx   . Learning disabilities Neg Hx   . Mental illness Neg Hx   . Mental retardation Neg Hx   . Miscarriages /  Stillbirths Neg Hx   . Stroke Neg Hx   . Vision loss Neg Hx   . Cancer Maternal Grandfather     brain   Social History  Substance Use Topics  . Smoking status: Never Smoker   . Smokeless tobacco: Never Used  . Alcohol Use: No   OB History    Gravida Para Term Preterm AB TAB SAB Ectopic Multiple Living   0 2     Review of Systems  HENT: Positive for congestion. Negative for ear pain, sneezing and sore throat.        Positive for right nasal pain  Respiratory: Negative for cough.     Allergies  Review of patient's allergies indicates no known allergies.  Home Medications   Prior to Admission medications   Medication Sig Start Date End Date Taking? Authorizing Provider  benzocaine-Menthol (DERMOPLAST) 20-0.5 % AERO Apply 1 application topically 3 (three) times daily as needed for irritation (perineal discomfort). 02/13/16   Rachelle A Denney, CNM  bisacodyl (DULCOLAX) 10 MG suppository Place 1 suppository (10 mg total) rectally daily as needed for moderate constipation. 02/13/16   Rachelle A Denney, CNM  dibucaine (NUPERCAINAL) 1 % OINT Place 1  application rectally as needed for hemorrhoids. 02/13/16   Rachelle A Denney, CNM  ibuprofen (ADVIL,MOTRIN) 800 MG tablet Take 1 tablet (800 mg total) by mouth every 8 (eight) hours as needed. 02/13/16   Rachelle A Denney, CNM  oxyCODONE-acetaminophen (PERCOCET/ROXICET) 5-325 MG tablet Take 2 tablets by mouth every 4 (four) hours as needed (pain scale > 7). 02/13/16   Rachelle A Denney, CNM  Prenat-FePoly-Metf-FA-DHA-DSS (VITAFOL FE+) 90-1-200 & 50 MG CPPK Take 2 tablets by mouth daily. 02/13/16   Rachelle A Denney, CNM  senna-docusate (SENOKOT-S) 8.6-50 MG tablet Take 2 tablets by mouth at bedtime. 02/13/16   Roe Coombsachelle A Denney, CNM  witch hazel-glycerin (TUCKS) pad Apply 1 application topically as needed for hemorrhoids. 02/13/16   Rachelle A Denney, CNM   BP 115/81 mmHg  Pulse 76  Temp(Src) 98.1 F (36.7 C) (Oral)  Resp 16  SpO2  100% Physical Exam  Constitutional: She is oriented to person, place, and time. She appears well-developed and well-nourished. No distress.  HENT:  Head: Normocephalic and atraumatic.  Area of induration and fluctuance noted to right nare without septal involvement Tender to palpation Normal septum No active drainage No surrounding skin erythema  Eyes: Conjunctivae and EOM are normal.  Neck: Neck supple. No tracheal deviation present.  Cardiovascular: Normal rate.   Pulmonary/Chest: Effort normal. No respiratory distress.  Musculoskeletal: Normal range of motion.  Neurological: She is alert and oriented to person, place, and time.  Skin: Skin is warm and dry.  Psychiatric: She has a normal mood and affect. Her behavior is normal.  Nursing note and vitals reviewed.   ED Course  Procedures (including critical care time)  DIAGNOSTIC STUDIES: Oxygen Saturation is 100% on RA, normal by my interpretation.    COORDINATION OF CARE: 12:16 PM Will administer xylocaine 200 mg injection and Tylenol tablet 650 mg. Will discharge with Tylenol 500 mg, ointment, and Keflex 500 mg. Discussed treatment plan with pt at bedside and pt agreed to plan.  INCISION AND DRAINAGE Performed by: Fayrene HelperRAN,Hannie Shoe Consent: Verbal consent obtained. Risks and benefits: risks, benefits and alternatives were discussed Type: abscess  Body area: nose, R nare  Anesthesia: local infiltration  Incision was made with a scalpel.  Local anesthetic: lidocaine 2% w/o epinephrine  Anesthetic total: 1 ml  Complexity: complex Blunt dissection to break up loculations  Drainage: purulent  Drainage amount: small  Packing material: none  Patient tolerance: Patient tolerated the procedure well with no immediate complications.     MDM   Final diagnoses:  Abscess of nose   BP 115/81 mmHg  Pulse 76  Temp(Src) 98.1 F (36.7 C) (Oral)  Resp 16  SpO2 100%  I personally performed the services described in this  documentation, which was scribed in my presence. The recorded information has been reviewed and is accurate.     Fayrene HelperBowie Shianna Bally, PA-C 03/01/16 1252  Lavera Guiseana Duo Liu, MD 03/02/16 1150

## 2016-03-01 NOTE — ED Notes (Signed)
Pt c/o "bump" inside R nare x 2 days.  Pain score 10/10.  No drainage noted.  Slight redness noted to outside of nare.

## 2016-03-19 ENCOUNTER — Other Ambulatory Visit: Payer: Self-pay | Admitting: Certified Nurse Midwife

## 2016-04-01 ENCOUNTER — Encounter: Payer: Self-pay | Admitting: Obstetrics

## 2016-04-01 ENCOUNTER — Ambulatory Visit (INDEPENDENT_AMBULATORY_CARE_PROVIDER_SITE_OTHER): Payer: Medicaid Other | Admitting: Obstetrics

## 2016-04-01 DIAGNOSIS — K5901 Slow transit constipation: Secondary | ICD-10-CM

## 2016-04-01 DIAGNOSIS — Z3202 Encounter for pregnancy test, result negative: Secondary | ICD-10-CM | POA: Diagnosis not present

## 2016-04-01 DIAGNOSIS — Z3009 Encounter for other general counseling and advice on contraception: Secondary | ICD-10-CM

## 2016-04-01 LAB — POCT URINE PREGNANCY: Preg Test, Ur: NEGATIVE

## 2016-04-01 MED ORDER — DOCUSATE SODIUM 100 MG PO CAPS: 100.0000 mg | ORAL_CAPSULE | Freq: Two times a day (BID) | ORAL | Status: DC | PRN

## 2016-04-01 NOTE — Progress Notes (Signed)
Subjective:     Carrie Nielsen is a 25 y.o. female who presents for a postpartum visit. She is 7 weeks postpartum following a spontaneous vaginal delivery. I have fully reviewed the prenatal and intrapartum course. The delivery was at 41 gestational weeks. Outcome: spontaneous vaginal delivery. Anesthesia: epidural. Postpartum course has been normal. Baby's course has been normal. Baby is feeding by breast. Bleeding no bleeding. Bowel function is associated with mild constipation. Bladder function is normal. Patient is not sexually active. Contraception method is abstinence. Postpartum depression screening: negative.  Tobacco, alcohol and substance abuse history reviewed.  Adult immunizations reviewed including TDAP, rubella and varicella.  The following portions of the patient's history were reviewed and updated as appropriate: allergies, current medications, past family history, past medical history, past social history, past surgical history and problem list.  Review of Systems A comprehensive review of systems was negative.   Objective:    BP 117/76 mmHg  Pulse 90  Wt 194 lb (87.998 kg)  General:  alert and no distress   Breasts:  inspection negative, no nipple discharge or bleeding, no masses or nodularity palpable  Lungs: clear to auscultation bilaterally  Heart:  regular rate and rhythm, S1, S2 normal, no murmur, click, rub or gallop  Abdomen: soft, non-tender; bowel sounds normal; no masses,  no organomegaly   Vulva:  normal  Vagina: normal vagina  Cervix:  no cervical motion tenderness  Corpus: normal size, contour, position, consistency, mobility, non-tender  Adnexa:  no mass, fullness, tenderness  Rectal Exam: Not performed.           Assessment:     Normal postpartum exam. Pap smear not done at today's visit.    Plan:    1. Contraception: Considering options 2. Continue PNV's 3. Follow up in: 6 weeks or as needed.   Healthy lifestyle practices reviewed

## 2016-04-04 ENCOUNTER — Encounter: Payer: Self-pay | Admitting: Obstetrics

## 2016-04-14 ENCOUNTER — Telehealth: Payer: Self-pay | Admitting: *Deleted

## 2016-04-14 NOTE — Telephone Encounter (Signed)
Attempt to contact pt regarding Nexplanon placement appt.  No answer, LM on VM to call office.

## 2016-05-15 ENCOUNTER — Encounter: Payer: Self-pay | Admitting: Obstetrics

## 2016-05-15 ENCOUNTER — Ambulatory Visit: Payer: Medicaid Other | Admitting: Obstetrics

## 2016-05-15 ENCOUNTER — Ambulatory Visit (INDEPENDENT_AMBULATORY_CARE_PROVIDER_SITE_OTHER): Payer: Medicaid Other | Admitting: Obstetrics

## 2016-05-15 VITALS — BP 115/74 | HR 76 | Wt 198.0 lb

## 2016-05-15 DIAGNOSIS — N898 Other specified noninflammatory disorders of vagina: Secondary | ICD-10-CM

## 2016-05-15 DIAGNOSIS — N758 Other diseases of Bartholin's gland: Secondary | ICD-10-CM

## 2016-05-15 DIAGNOSIS — Z3009 Encounter for other general counseling and advice on contraception: Secondary | ICD-10-CM | POA: Diagnosis not present

## 2016-05-15 NOTE — Progress Notes (Signed)
Patient ID: Carrie Nielsen, female   DOB: 09/14/1991, 25 y.o.   MRN: [id number]  Chief Complaint  Patient presents with  . Follow-up    would like check up and discuss BC    HPI Carrie PoliShequan Herber is a 25 y.o. female.  Malodorous vaginal discharge.  Would like to discuss contraceptive options.  HPI  Past Medical History  Diagnosis Date  . History of cold sores   . Medical history non-contributory     Past Surgical History  Procedure Laterality Date  . Wisdom tooth extraction    . No past surgeries      Family History  Problem Relation Age of Onset  . Alcohol abuse Neg Hx   . Arthritis Neg Hx   . Asthma Neg Hx   . Birth defects Neg Hx   . COPD Neg Hx   . Depression Neg Hx   . Diabetes Neg Hx   . Early death Neg Hx   . Drug abuse Neg Hx   . Hearing loss Neg Hx   . Heart disease Neg Hx   . Hyperlipidemia Neg Hx   . Hypertension Neg Hx   . Kidney disease Neg Hx   . Learning disabilities Neg Hx   . Mental illness Neg Hx   . Mental retardation Neg Hx   . Miscarriages / Stillbirths Neg Hx   . Stroke Neg Hx   . Vision loss Neg Hx   . Cancer Maternal Grandfather     brain    Social History Social History  Substance Use Topics  . Smoking status: Never Smoker   . Smokeless tobacco: Never Used  . Alcohol Use: No    No Known Allergies  Current Outpatient Prescriptions  Medication Sig Dispense Refill  . acetaminophen (TYLENOL) 500 MG tablet Take 1 tablet (500 mg total) by mouth every 6 (six) hours as needed. (Patient not taking: Reported on 05/15/2016) 30 tablet 0  . docusate sodium (COLACE) 100 MG capsule Take 1 capsule (100 mg total) by mouth 2 (two) times daily as needed for mild constipation. (Patient not taking: Reported on 05/15/2016) 60 capsule prn  . mupirocin nasal ointment (BACTROBAN) 2 % Apply in each nostril daily (Patient not taking: Reported on 05/15/2016) 1 g 0  . Prenat-FePoly-Metf-FA-DHA-DSS (VITAFOL FE+) 90-1-200 & 50 MG CPPK Take 2 tablets by mouth daily.  (Patient not taking: Reported on 05/15/2016) 60 each 12   No current facility-administered medications for this visit.    Review of Systems Review of Systems Constitutional: negative for fatigue and weight loss Respiratory: negative for cough and wheezing Cardiovascular: negative for chest pain, fatigue and palpitations Gastrointestinal: negative for abdominal pain and change in bowel habits Genitourinary:positive for malodorous vaginal discharge Integument/breast: negative for nipple discharge Musculoskeletal:negative for myalgias Neurological: negative for gait problems and tremors Behavioral/Psych: negative for abusive relationship, depression Endocrine: negative for temperature intolerance     Blood pressure 115/74, pulse 76, weight 198 lb (89.812 kg), unknown if currently breastfeeding.  Physical Exam Physical Exam           General:  Alert and no distress Abdomen:  normal findings: no organomegaly, soft, non-tender and no hernia  Pelvis:  External genitalia: mildly swollen left Bartholin Gland, nontender. Urinary system: urethral meatus normal and bladder without fullness, nontender Vaginal: normal without tenderness, induration or masses Cervix: normal appearance Adnexa: normal bimanual exam Uterus: anteverted and non-tender, normal size      Data Reviewed Labs  Assessment     Malodorous vaginal  discharge  Left Bartholin Gland inflammation    Plan    Wet prep and cultures done Clindamycin Rx F/U prn or for contraception  No orders of the defined types were placed in this encounter.   No orders of the defined types were placed in this encounter.

## 2016-05-20 LAB — NUSWAB VG+, CANDIDA 6SP
CANDIDA GLABRATA, NAA: NEGATIVE
CANDIDA KRUSEI, NAA: NEGATIVE
CANDIDA TROPICALIS, NAA: NEGATIVE
Candida albicans, NAA: NEGATIVE
Candida lusitaniae, NAA: NEGATIVE
Candida parapsilosis, NAA: NEGATIVE
Chlamydia trachomatis, NAA: NEGATIVE
Neisseria gonorrhoeae, NAA: NEGATIVE
Trich vag by NAA: NEGATIVE

## 2016-06-04 ENCOUNTER — Encounter: Payer: Self-pay | Admitting: Obstetrics

## 2016-06-04 ENCOUNTER — Ambulatory Visit (INDEPENDENT_AMBULATORY_CARE_PROVIDER_SITE_OTHER): Payer: Medicaid Other | Admitting: Obstetrics

## 2016-06-04 VITALS — BP 116/75 | HR 83 | Temp 99.0°F | Wt 203.2 lb

## 2016-06-04 DIAGNOSIS — A499 Bacterial infection, unspecified: Secondary | ICD-10-CM

## 2016-06-04 DIAGNOSIS — N760 Acute vaginitis: Secondary | ICD-10-CM | POA: Diagnosis not present

## 2016-06-04 DIAGNOSIS — Z3049 Encounter for surveillance of other contraceptives: Secondary | ICD-10-CM

## 2016-06-04 DIAGNOSIS — Z01818 Encounter for other preprocedural examination: Secondary | ICD-10-CM

## 2016-06-04 DIAGNOSIS — B9689 Other specified bacterial agents as the cause of diseases classified elsewhere: Secondary | ICD-10-CM

## 2016-06-04 DIAGNOSIS — Z3202 Encounter for pregnancy test, result negative: Secondary | ICD-10-CM | POA: Diagnosis not present

## 2016-06-04 DIAGNOSIS — M549 Dorsalgia, unspecified: Secondary | ICD-10-CM

## 2016-06-04 DIAGNOSIS — Z30017 Encounter for initial prescription of implantable subdermal contraceptive: Secondary | ICD-10-CM

## 2016-06-04 LAB — POCT URINE PREGNANCY: Preg Test, Ur: NEGATIVE

## 2016-06-04 MED ORDER — METRONIDAZOLE 500 MG PO TABS: 500.0000 mg | ORAL_TABLET | Freq: Two times a day (BID) | ORAL | 2 refills | Status: DC

## 2016-06-04 MED ORDER — IBUPROFEN 800 MG PO TABS: 800.0000 mg | ORAL_TABLET | Freq: Three times a day (TID) | ORAL | 5 refills | Status: DC | PRN

## 2016-06-04 NOTE — Progress Notes (Signed)
Nexplanon Procedure Note   PRE-OP DIAGNOSIS: desired long-term, reversible contraception  POST-OP DIAGNOSIS: Same  PROCEDURE: Nexplanon  placement Performing Provider: Coral Ceo MD  Patient education prior to procedure, explained risk, benefits of Nexplanon, reviewed alternative options. Patient reported understanding. Gave consent to continue with procedure.   PROCEDURE:  Pregnancy Text :  Negative Site (check):      left arm         Sterile Preparation:   Betadinex3 Lot # [id number] Expiration Date 06 / 2018  Insertion site was selected 8 - 10 cm from medial epicondyle and marked along with guiding site using sterile marker. Procedure area was prepped and draped in a sterile fashion. 1% Lidocaine 1.5 ml given prior to procedure. Nexplanon  was inserted subcutaneously.Needle was removed from the insertion site. Nexplanon capsule was palpated by provider and patient to assure satisfactory placement. Dressing applied.  Followup: The patient tolerated the procedure well without complications.  Standard post-procedure care is explained and return precautions are given.  Coral Ceo MD

## 2016-06-18 ENCOUNTER — Ambulatory Visit: Payer: Medicaid Other | Admitting: Obstetrics

## 2016-07-10 ENCOUNTER — Ambulatory Visit: Payer: Self-pay | Admitting: Obstetrics

## 2016-08-05 ENCOUNTER — Ambulatory Visit: Payer: Self-pay | Admitting: Certified Nurse Midwife

## 2016-08-31 ENCOUNTER — Ambulatory Visit: Payer: Medicaid Other | Admitting: Obstetrics

## 2016-09-24 ENCOUNTER — Ambulatory Visit: Payer: Medicaid Other | Admitting: Obstetrics

## 2016-10-08 ENCOUNTER — Ambulatory Visit (INDEPENDENT_AMBULATORY_CARE_PROVIDER_SITE_OTHER): Payer: Medicaid Other | Admitting: Obstetrics

## 2016-10-08 ENCOUNTER — Encounter: Payer: Self-pay | Admitting: Obstetrics

## 2016-10-08 VITALS — BP 138/89 | HR 95 | Temp 98.6°F | Wt 222.0 lb

## 2016-10-08 DIAGNOSIS — Z113 Encounter for screening for infections with a predominantly sexual mode of transmission: Secondary | ICD-10-CM | POA: Diagnosis not present

## 2016-10-08 DIAGNOSIS — Z3046 Encounter for surveillance of implantable subdermal contraceptive: Secondary | ICD-10-CM | POA: Diagnosis not present

## 2016-10-08 DIAGNOSIS — N898 Other specified noninflammatory disorders of vagina: Secondary | ICD-10-CM

## 2016-10-08 DIAGNOSIS — A6004 Herpesviral vulvovaginitis: Secondary | ICD-10-CM

## 2016-10-08 DIAGNOSIS — L309 Dermatitis, unspecified: Secondary | ICD-10-CM

## 2016-10-08 MED ORDER — VALACYCLOVIR HCL 1 G PO TABS: 1000.0000 mg | ORAL_TABLET | Freq: Two times a day (BID) | ORAL | 99 refills | Status: DC

## 2016-10-08 MED ORDER — FLUOCINONIDE-E 0.05 % EX CREA: 1.0000 "application " | TOPICAL_CREAM | Freq: Two times a day (BID) | CUTANEOUS | 2 refills | Status: DC

## 2016-10-08 NOTE — Addendum Note (Signed)
Addended by: Coral CeoHARPER, CHARLES A on: 10/08/2016 05:03 PM   Modules accepted: Orders

## 2016-10-08 NOTE — Progress Notes (Addendum)
Patient ID: Carrie Nielsen, female   DOB: 09/18/1991, 25 y.o.   MRN: [id number]  Chief Complaint  Patient presents with  . Follow-up    Nexplanon  . other    STD screening    HPI Carrie Nielsen is a 25 y.o. female.  Presents for screening for STD.  Has increased vaginal discharge.  Denies vaginal odor or irritation.  Patient feels that she may have been exposed to an STD.  She also requests surveillance of her Nexplanon.  She also c/o pruritic areas on fingers. HPI  Past Medical History:  Diagnosis Date  . History of cold sores   . Medical history non-contributory     Past Surgical History:  Procedure Laterality Date  . NO PAST SURGERIES    . WISDOM TOOTH EXTRACTION      Family History  Problem Relation Age of Onset  . Alcohol abuse Neg Hx   . Arthritis Neg Hx   . Asthma Neg Hx   . Birth defects Neg Hx   . COPD Neg Hx   . Depression Neg Hx   . Diabetes Neg Hx   . Early death Neg Hx   . Drug abuse Neg Hx   . Hearing loss Neg Hx   . Heart disease Neg Hx   . Hyperlipidemia Neg Hx   . Hypertension Neg Hx   . Kidney disease Neg Hx   . Learning disabilities Neg Hx   . Mental illness Neg Hx   . Mental retardation Neg Hx   . Miscarriages / Stillbirths Neg Hx   . Stroke Neg Hx   . Vision loss Neg Hx   . Cancer Maternal Grandfather     brain    Social History Social History  Substance Use Topics  . Smoking status: Never Smoker  . Smokeless tobacco: Never Used  . Alcohol use No    No Known Allergies  Current Outpatient Prescriptions  Medication Sig Dispense Refill  . ibuprofen (ADVIL,MOTRIN) 800 MG tablet Take 1 tablet (800 mg total) by mouth every 8 (eight) hours as needed. 30 tablet 5  . metroNIDAZOLE (FLAGYL) 500 MG tablet Take 1 tablet (500 mg total) by mouth 2 (two) times daily. (Patient not taking: Reported on 10/08/2016) 14 tablet 2   No current facility-administered medications for this visit.     Review of Systems Review of Systems Constitutional:  negative for fatigue and weight loss Respiratory: negative for cough and wheezing Cardiovascular: negative for chest pain, fatigue and palpitations Gastrointestinal: negative for abdominal pain and change in bowel habits Genitourinary:positive for increased vaginal discharge Integument/breast: negative for nipple discharge Musculoskeletal:negative for myalgias Neurological: negative for gait problems and tremors Behavioral/Psych: negative for abusive relationship, depression Endocrine: negative for temperature intolerance      Blood pressure 138/89, pulse 95, temperature 98.6 F (37 C), temperature source Oral, weight 222 lb (100.7 kg), last menstrual period 10/03/2016, currently breastfeeding.  Physical Exam Physical Exam            General:  Alert and no distress           Left Upper Arm:  Nexplanon insertion site clean.  Rod palpated, intact and non tender. Abdomen:  normal findings: no organomegaly, soft, non-tender and no hernia  Pelvis:  External genitalia: normal general appearance Urinary system: urethral meatus normal and bladder without fullness, nontender Vaginal: normal without tenderness, induration or masses Cervix: normal appearance Adnexa: normal bimanual exam Uterus: anteverted and non-tender, normal size    50% of 15  min visit spent on counseling and coordination of care.    Data Reviewed Labs  Assessment     Vaginal Discharge Screening for STD Nexplanon Surveillance.  Doing well. Dermatitis , superficial, digits    Plan    Wet prep and cultures done. Continue Nexplanon Lidex 0.05% cream Rx F/U prn.  No orders of the defined types were placed in this encounter.  No orders of the defined types were placed in this encounter.

## 2016-10-09 LAB — HEPATITIS C ANTIBODY: Hep C Virus Ab: <0.1 s/co ratio (ref 0.0–0.9)

## 2016-10-09 LAB — HEPATITIS B SURFACE ANTIGEN: Hepatitis B Surface Ag: NEGATIVE

## 2016-10-09 LAB — HIV ANTIBODY (ROUTINE TESTING W REFLEX): HIV Screen 4th Generation wRfx: NONREACTIVE

## 2016-10-09 LAB — RPR: RPR Ser Ql: NONREACTIVE

## 2016-10-12 LAB — NUSWAB VG+, CANDIDA 6SP
CANDIDA ALBICANS, NAA: NEGATIVE
CHLAMYDIA TRACHOMATIS, NAA: NEGATIVE
Candida glabrata, NAA: NEGATIVE
Candida krusei, NAA: NEGATIVE
Candida lusitaniae, NAA: NEGATIVE
Candida parapsilosis, NAA: NEGATIVE
Candida tropicalis, NAA: NEGATIVE
NEISSERIA GONORRHOEAE, NAA: NEGATIVE
Trich vag by NAA: NEGATIVE

## 2016-10-14 ENCOUNTER — Telehealth: Payer: Self-pay

## 2016-10-14 NOTE — Telephone Encounter (Signed)
Returned pt call, no answer, left vm to call back.

## 2016-10-15 ENCOUNTER — Telehealth: Payer: Self-pay

## 2016-10-15 NOTE — Telephone Encounter (Signed)
Patient called in for lab results  ?

## 2016-10-22 ENCOUNTER — Ambulatory Visit (INDEPENDENT_AMBULATORY_CARE_PROVIDER_SITE_OTHER): Payer: Medicaid Other | Admitting: Obstetrics

## 2016-10-22 VITALS — BP 117/64 | HR 87 | Temp 98.6°F | Wt 220.0 lb

## 2016-10-22 DIAGNOSIS — L309 Dermatitis, unspecified: Secondary | ICD-10-CM

## 2016-10-22 DIAGNOSIS — N760 Acute vaginitis: Secondary | ICD-10-CM

## 2016-10-22 DIAGNOSIS — N898 Other specified noninflammatory disorders of vagina: Secondary | ICD-10-CM

## 2016-10-22 DIAGNOSIS — B9689 Other specified bacterial agents as the cause of diseases classified elsewhere: Secondary | ICD-10-CM

## 2016-10-22 MED ORDER — FLUOCINONIDE-E 0.05 % EX CREA: 1.0000 "application " | TOPICAL_CREAM | Freq: Two times a day (BID) | CUTANEOUS | 2 refills | Status: DC

## 2016-10-22 NOTE — Progress Notes (Signed)
Patient ID: Carrie Nielsen, female   DOB: 10/29/1991, 25 y.o.   MRN: [id number]  Chief Complaint  Patient presents with  . Follow-up    Discharge    HPI Carrie Nielsen is a 25 y.o. female.  Malodorous vaginal discharge.  Denies itching or irritation.  Also has very pruritic rash on hands, ankles and chest. HPI  Past Medical History:  Diagnosis Date  . History of cold sores   . Medical history non-contributory     Past Surgical History:  Procedure Laterality Date  . NO PAST SURGERIES    . WISDOM TOOTH EXTRACTION      Family History  Problem Relation Age of Onset  . Alcohol abuse Neg Hx   . Arthritis Neg Hx   . Asthma Neg Hx   . Birth defects Neg Hx   . COPD Neg Hx   . Depression Neg Hx   . Diabetes Neg Hx   . Early death Neg Hx   . Drug abuse Neg Hx   . Hearing loss Neg Hx   . Heart disease Neg Hx   . Hyperlipidemia Neg Hx   . Hypertension Neg Hx   . Kidney disease Neg Hx   . Learning disabilities Neg Hx   . Mental illness Neg Hx   . Mental retardation Neg Hx   . Miscarriages / Stillbirths Neg Hx   . Stroke Neg Hx   . Vision loss Neg Hx   . Cancer Maternal Grandfather     brain    Social History Social History  Substance Use Topics  . Smoking status: Never Smoker  . Smokeless tobacco: Never Used  . Alcohol use No    No Known Allergies  Current Outpatient Prescriptions  Medication Sig Dispense Refill  . ibuprofen (ADVIL,MOTRIN) 800 MG tablet Take 1 tablet (800 mg total) by mouth every 8 (eight) hours as needed. 30 tablet 5  . valACYclovir (VALTREX) 1000 MG tablet Take 1 tablet (1,000 mg total) by mouth 2 (two) times daily. 30 tablet prn  . fluocinonide-emollient (LIDEX-E) 0.05 % cream Apply 1 application topically 2 (two) times daily. 60 g 2  . metroNIDAZOLE (FLAGYL) 500 MG tablet Take 1 tablet (500 mg total) by mouth 2 (two) times daily. (Patient not taking: Reported on 10/22/2016) 14 tablet 2   No current facility-administered medications for this  visit.     Review of Systems Review of Systems Constitutional: negative for fatigue and weight loss Respiratory: negative for cough and wheezing Cardiovascular: negative for chest pain, fatigue and palpitations Gastrointestinal: negative for abdominal pain and change in bowel habits Genitourinary:positive for malodorous vaginal discharge Integument/breast: positive for rash on hands, ankles and chest Musculoskeletal:negative for myalgias Neurological: negative for gait problems and tremors Behavioral/Psych: negative for abusive relationship, depression Endocrine: negative for temperature intolerance      Blood pressure 117/64, pulse 87, temperature 98.6 F (37 C), temperature source Oral, weight 220 lb (99.8 kg), last menstrual period 10/03/2016, currently breastfeeding.  Physical Exam Physical Exam General:   alert  Skin:   rash on hands  Lungs:   clear to auscultation bilaterally  Heart:   regular rate and rhythm, S1, S2 normal, no murmur, click, rub or gallop  Breasts:   normal without suspicious masses, skin or nipple changes or axillary nodes  Abdomen:  normal findings: no organomegaly, soft, non-tender and no hernia  Pelvis:  External genitalia: normal general appearance Urinary system: urethral meatus normal and bladder without fullness, nontender Vaginal: grey, thin malodorous discharge  Cervix: normal appearance Adnexa: normal bimanual exam Uterus: anteverted and non-tender, normal size    50% of 15 min visit spent on counseling and coordination of care.    Data Reviewed Wet prep  Assessment     BV Pruritic Dermatitis    Plan    Tindamax Dispensed Lidex cream Rx F/U prn  Orders Placed This Encounter  Procedures  . Ambulatory referral to Dermatology    Referral Priority:   Routine    Referral Type:   Consultation    Referral Reason:   Specialty Services Required    Requested Specialty:   Dermatology    Number of Visits Requested:   1   Meds ordered  this encounter  Medications  . fluocinonide-emollient (LIDEX-E) 0.05 % cream    Sig: Apply 1 application topically 2 (two) times daily.    Dispense:  60 g    Refill:  2

## 2016-10-22 NOTE — Patient Instructions (Signed)
Atopic Dermatitis Atopic dermatitis is a skin disorder that causes inflammation of the skin. This is the most common type of eczema. Eczema is a group of skin conditions that cause the skin to be itchy, red, and swollen. This condition is generally worse during the cooler winter months and often improves during the warm summer months. Symptoms can vary from person to person. Atopic dermatitis usually starts showing signs in infancy and can last through adulthood. This condition cannot be passed from one person to another (non-contagious), but is more common in families. Atopic dermatitis may not always be present. When it is present, it is called a flare-up. What are the causes? The exact cause of this condition is not known. Flare-ups of the condition may be triggered by:  Contact with something you are sensitive or allergic to.  Stress.  Certain foods.  Extremely hot or cold weather.  Harsh chemicals and soaps.  Dry air.  Chlorine. What increases the risk? This condition is more likely to develop in people who have a personal history or family history of eczema, allergies, asthma, or hay fever. What are the signs or symptoms? Symptoms of this condition include:  Dry, scaly skin.  Red, itchy rash.  Itchiness, which can be severe. This may occur before the skin rash. This can make sleeping difficult.  Skin thickening and cracking can occur over time. How is this diagnosed? This condition is diagnosed based on your symptoms, a medical history, and a physical exam. How is this treated? There is no cure for this condition, but symptoms can usually be controlled. Treatment focuses on:  Controlling the itching and scratching. You may be given medicines, such as antihistamines or steroid creams.  Limiting exposure to things that you are sensitive or allergic to (allergens).  Recognizing situations that cause stress and developing a plan to manage stress. If your atopic dermatitis  does not get better with medicines or is all over your body (widespread) , a treatment using a specific type of light (phototherapy) may be used. Follow these instructions at home: Skin care  Keep your skin well-moisturized. This seals in moisture and help prevent dryness.  Use unscented lotions that have petroleum in them.  Avoid lotions that contain alcohol and water. They can dry the skin.  Keep baths or showers short (less than 5 minutes) in warm water. Do not use hot water.  Use mild, unscented cleansers for bathing. Avoid soap and bubble bath.  Apply a moisturizer to your skin right after a bath or shower.   Do not apply anything to your skin without checking with your health care provider. General instructions  Dress in clothes made of cotton or cotton blends. Dress lightly because heat increases itching.  When washing your clothes, rinse your clothes twice so all of the soap is removed.  Avoid any triggers that can cause a flare-up.  Try to manage your stress.  Keep your fingernails cut short.  Avoid scratching. Scratching makes the rash and itching worse. It may also result in a skin infection (impetigo) due to a break in the skin caused by scratching.  Take or apply over-the-counter and prescription medicines only as told by your health care provider.  Keep all follow-up visits as told by your health care provider. This is important.  Do not be around people who have cold sores or fever blisters. If you get the infection, it may cause your atopic dermatitis to worsen. Contact a health care provider if:  Your itching   interferes with sleep.  Your rash gets worse or is not better within one week of starting treatment.  You have a fever.  You have a rash flare-up after having contact with someone who has cold sores or fever blisters. Get help right away if:  You develop pus or soft yellow scabs in the rash area. Summary  This condition causes a red rash and  itchy, dry, scaly skin.  Treatment focuses on controlling the itching and scratching, limiting exposure to things that you are sensitive or allergic to (allergens), and recognizing situations that cause stress and developing a plan to manage stress.  Keep your skin well-moisturized.  Keep baths or showers less than 5 minutes. This information is not intended to replace advice given to you by your health care provider. Make sure you discuss any questions you have with your health care provider. Document Released: 10/23/2000 Document Revised: 04/02/2016 Document Reviewed: 05/29/2013 Elsevier Interactive Patient Education  2017 Elsevier Inc.  Bacterial Vaginosis Bacterial vaginosis is a vaginal infection that occurs when the normal balance of bacteria in the vagina is disrupted. It results from an overgrowth of certain bacteria. This is the most common vaginal infection among women ages 215-44. Because bacterial vaginosis increases your risk for STIs (sexually transmitted infections), getting treated can help reduce your risk for chlamydia, gonorrhea, herpes, and HIV (human immunodeficiency virus). Treatment is also important for preventing complications in pregnant women, because this condition can cause an early (premature) delivery. What are the causes? This condition is caused by an increase in harmful bacteria that are normally present in small amounts in the vagina. However, the reason that the condition develops is not fully understood. What increases the risk? The following factors may make you more likely to develop this condition:  Having a new sexual partner or multiple sexual partners.  Having unprotected sex.  Douching.  Having an intrauterine device (IUD).  Smoking.  Drug and alcohol abuse.  Taking certain antibiotic medicines.  Being pregnant. You cannot get bacterial vaginosis from toilet seats, bedding, swimming pools, or contact with objects around you. What are the  signs or symptoms? Symptoms of this condition include:  Grey or white vaginal discharge. The discharge can also be watery or foamy.  A fish-like odor with discharge, especially after sexual intercourse or during menstruation.  Itching in and around the vagina.  Burning or pain with urination. Some women with bacterial vaginosis have no signs or symptoms. How is this diagnosed? This condition is diagnosed based on:  Your medical history.  A physical exam of the vagina.  Testing a sample of vaginal fluid under a microscope to look for a large amount of bad bacteria or abnormal cells. Your health care provider may use a cotton swab or a small wooden spatula to collect the sample. How is this treated? This condition is treated with antibiotics. These may be given as a pill, a vaginal cream, or a medicine that is put into the vagina (suppository). If the condition comes back after treatment, a second round of antibiotics may be needed. Follow these instructions at home: Medicines  Take over-the-counter and prescription medicines only as told by your health care provider.  Take or use your antibiotic as told by your health care provider. Do not stop taking or using the antibiotic even if you start to feel better. General instructions  If you have a female sexual partner, tell her that you have a vaginal infection. She should see her health care provider and  be treated if she has symptoms. If you have a female sexual partner, he does not need treatment.  During treatment:  Avoid sexual activity until you finish treatment.  Do not douche.  Avoid alcohol as directed by your health care provider.  Avoid breastfeeding as directed by your health care provider.  Drink enough water and fluids to keep your urine clear or pale yellow.  Keep the area around your vagina and rectum clean.  Wash the area daily with warm water.  Wipe yourself from front to back after using the toilet.  Keep  all follow-up visits as told by your health care provider. This is important. How is this prevented?  Do not douche.  Wash the outside of your vagina with warm water only.  Use protection when having sex. This includes latex condoms and dental dams.  Limit how many sexual partners you have. To help prevent bacterial vaginosis, it is best to have sex with just one partner (monogamous).  Make sure you and your sexual partner are tested for STIs.  Wear cotton or cotton-lined underwear.  Avoid wearing tight pants and pantyhose, especially during summer.  Limit the amount of alcohol that you drink.  Do not use any products that contain nicotine or tobacco, such as cigarettes and e-cigarettes. If you need help quitting, ask your health care provider.  Do not use illegal drugs. Where to find more information:  Centers for Disease Control and Prevention: SolutionApps.co.zawww.cdc.gov/std  American Sexual Health Association (ASHA): www.ashastd.org  U.S. Department of Health and Health and safety inspectorHuman Services, Office on Women's Health: ConventionalMedicines.siwww.womenshealth.gov/ or http://www.anderson-williamson.info/https://www.womenshealth.gov/a-z-topics/bacterial-vaginosis Contact a health care provider if:  Your symptoms do not improve, even after treatment.  You have more discharge or pain when urinating.  You have a fever.  You have pain in your abdomen.  You have pain during sex.  You have vaginal bleeding between periods. Summary  Bacterial vaginosis is a vaginal infection that occurs when the normal balance of bacteria in the vagina is disrupted.  Because bacterial vaginosis increases your risk for STIs (sexually transmitted infections), getting treated can help reduce your risk for chlamydia, gonorrhea, herpes, and HIV (human immunodeficiency virus). Treatment is also important for preventing complications in pregnant women, because the condition can cause an early (premature) delivery.  This condition is treated with antibiotic medicines. These may be given  as a pill, a vaginal cream, or a medicine that is put into the vagina (suppository). This information is not intended to replace advice given to you by your health care provider. Make sure you discuss any questions you have with your health care provider. Document Released: 10/26/2005 Document Revised: 07/11/2016 Document Reviewed: 07/11/2016 Elsevier Interactive Patient Education  2017 [employer]

## 2016-10-25 ENCOUNTER — Encounter: Payer: Self-pay | Admitting: Obstetrics

## 2016-11-03 ENCOUNTER — Telehealth: Payer: Self-pay | Admitting: *Deleted

## 2016-11-03 NOTE — Telephone Encounter (Signed)
Pt called in about a prescription that needed a pre auth from the pharmacy for the Lidex that was sent in on 10/22/2016 this is the 2nd time patient called, please advise.Marland Kitchen..Marland Kitchen

## 2016-11-04 NOTE — Telephone Encounter (Signed)
Reviewed with Dr Clearance CootsHarper, Rx was changed. Triamcinolone cream was called to pharmacy. Pt has been made aware and advised to call office if any problems filling.

## 2016-11-05 ENCOUNTER — Telehealth: Payer: Self-pay

## 2016-11-05 NOTE — Telephone Encounter (Signed)
Returned call, no answer, left vm to call 

## 2016-11-05 NOTE — Telephone Encounter (Signed)
Patient called in about rx

## 2016-11-10 ENCOUNTER — Other Ambulatory Visit: Payer: Self-pay | Admitting: *Deleted

## 2017-02-24 ENCOUNTER — Ambulatory Visit: Payer: Medicaid Other | Admitting: Obstetrics

## 2017-04-26 ENCOUNTER — Ambulatory Visit: Payer: Medicaid Other | Admitting: Obstetrics

## 2017-10-09 ENCOUNTER — Encounter (HOSPITAL_COMMUNITY): Payer: Self-pay

## 2017-10-09 ENCOUNTER — Other Ambulatory Visit: Payer: Self-pay

## 2017-10-09 ENCOUNTER — Emergency Department (HOSPITAL_COMMUNITY)
Admission: EM | Admit: 2017-10-09 | Discharge: 2017-10-09 | Disposition: A | Discharge status: Home / Self Care | Payer: Medicaid Other | Attending: Emergency Medicine | Admitting: Emergency Medicine

## 2017-10-09 DIAGNOSIS — Z79899 Other long term (current) drug therapy: Secondary | ICD-10-CM | POA: Diagnosis not present

## 2017-10-09 DIAGNOSIS — N751 Abscess of Bartholin's gland: Secondary | ICD-10-CM | POA: Insufficient documentation

## 2017-10-09 MED ORDER — HYDROCODONE-ACETAMINOPHEN 5-325 MG PO TABS: 1.0000 | ORAL_TABLET | ORAL | 0 refills | Status: DC | PRN

## 2017-10-09 MED ORDER — LIDOCAINE HCL (PF) 1 % IJ SOLN
5.0000 mL | Freq: Once | INTRAMUSCULAR | Status: AC
  Administered 2017-10-09: 5 mL
  Filled 2017-10-09: qty 5

## 2017-10-09 NOTE — ED Notes (Signed)
Pelvic cart at room 

## 2017-10-09 NOTE — ED Triage Notes (Addendum)
Pt BIB [facility] c/o "vaginal hernia." She states that it started when she gave birth about 19 months ago. She states that she hasn't had any issues until about 3 days ago. She is A&Ox4. Ambulatory. Pain 7/10. She states that she has been unable to put it back in like she usually can.

## 2017-10-09 NOTE — ED Provider Notes (Signed)
[facility]-EMERGENCY DEPT Provider Note   CSN: [id number] Arrival date & time: 10/09/17  1452     History   Chief Complaint Chief Complaint  Patient presents with  . Vaginal Prolapse    HPI Carrie Nielsen is a 26 y.o. female.  HPI Patient presents with a reported vaginal hernia.  States she has had it since she delivered birth around 19 months ago.  States the painful swelling.  No fevers.  No dysuria.  No vaginal bleeding or discharge.  Denies possibility of STD.  States she saw Dr. Clearance CootsHarper, her OB/GYN months ago told her it was a hernia.  I reviewed records it appears as if she only had a Bartholin's cyst at that time and no mention of a hernia. Past Medical History:  Diagnosis Date  . History of cold sores   . Medical history non-contributory     Patient Active Problem List   Diagnosis Date Noted  . Post-dates pregnancy 02/11/2016  . Status post vacuum-assisted vaginal delivery 02/11/2016  . Chlamydia infection 06/16/2014  . 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 Esophageal reflux 02/08/2013    Past Surgical History:  Procedure Laterality Date  . NO PAST SURGERIES    . WISDOM TOOTH EXTRACTION      OB History    Gravida Para Term Preterm AB Living   2 2 2     2    SAB TAB Ectopic Multiple Live Births         0 2       Home Medications    Prior to Admission medications   Medication Sig Start Date End Date Taking? Authorizing Provider  Etonogestrel (NEXPLANON [place]) Inject 1 application into the skin once.   Yes [provider]  valACYclovir (VALTREX) 1000 MG tablet Take 1 tablet (1,000 mg total) by mouth 2 (two) times daily. Patient taking differently: Take 1,000 mg by mouth daily as needed (cold sores).  10/08/16  Yes Brock BadHarper, Charles A, MD  fluocinonide-emollient (LIDEX-E) 0.05 % cream Apply 1 application topically 2 (two) times daily. Patient not taking: Reported on 10/09/2017 10/22/16   Brock BadHarper, Charles A, MD    HYDROcodone-acetaminophen (NORCO/VICODIN) 5-325 MG tablet Take 1-2 tablets by mouth every 4 (four) hours as needed. 10/09/17   Benjiman CorePickering, Josephina Melcher, MD  ibuprofen (ADVIL,MOTRIN) 800 MG tablet Take 1 tablet (800 mg total) by mouth every 8 (eight) hours as needed. Patient not taking: Reported on 10/09/2017 06/04/16   Brock BadHarper, Charles A, MD  metroNIDAZOLE (FLAGYL) 500 MG tablet Take 1 tablet (500 mg total) by mouth 2 (two) times daily. Patient not taking: Reported on 10/22/2016 06/04/16   Brock BadHarper, Charles A, MD    Family History Family History  Problem Relation Age of Onset  . Alcohol abuse Neg Hx   . Arthritis Neg Hx   . Asthma Neg Hx   . Birth defects Neg Hx   . COPD Neg Hx   . Depression Neg Hx   . Diabetes Neg Hx   . Early death Neg Hx   . Drug abuse Neg Hx   . Hearing loss Neg Hx   . Heart disease Neg Hx   . Hyperlipidemia Neg Hx   . Hypertension Neg Hx   . Kidney disease Neg Hx   . Learning disabilities Neg Hx   . Mental illness Neg Hx   . Mental retardation Neg Hx   . Miscarriages / Stillbirths Neg Hx   . Stroke Neg Hx   .  Vision loss Neg Hx   . Cancer Maternal Grandfather        brain    Social History Social History   Tobacco Use  . Smoking status: Never Smoker  . Smokeless tobacco: Never Used  Substance Use Topics  . Alcohol use: No    Alcohol/week: 0.0 oz  . Drug use: No     Allergies   Patient has no known allergies.   Review of Systems Review of Systems  Constitutional: Negative for appetite change.  Respiratory: Negative for shortness of breath.   Gastrointestinal: Negative for abdominal pain.  Genitourinary: Positive for vaginal pain. Negative for decreased urine volume, vaginal bleeding and vaginal discharge.  Musculoskeletal: Negative for back pain.  Skin: Negative for wound.  Neurological: Negative for syncope.  Psychiatric/Behavioral: Negative for confusion.     Physical Exam Updated Vital Signs BP 121/75   Pulse 84   Temp 98.3 F (36.8 C)  (Oral)   Resp 18   SpO2 99%   Physical Exam  Constitutional: She appears well-developed.  Cardiovascular: Normal rate.  Abdominal: There is no tenderness.  Genitourinary:  Genitourinary Comments: Left posterior labial swelling.  No erythema.  Likely Bartholin cyst/abscess  Musculoskeletal: She exhibits no edema.  Neurological: She is alert.  Skin: Skin is warm. Capillary refill takes less than 2 seconds.     ED Treatments / Results  Labs (all labs ordered are listed, but only abnormal results are displayed) Labs Reviewed - No data to display  EKG  EKG Interpretation None       Radiology No results found.  Procedures .Marland Kitchen.Incision and Drainage Date/Time: 10/09/2017 4:47 PM Performed by: Benjiman CorePickering, Kensli Bowley, MD Authorized by: Benjiman CorePickering, Daviel Allegretto, MD   Consent:    Consent obtained:  Verbal   Consent given by:  Patient   Risks discussed:  Bleeding, incomplete drainage and infection   Alternatives discussed:  Delayed treatment, no treatment, alternative treatment, observation and referral Location:    Type:  Bartholin cyst   Size:  Large   Location:  Anogenital   Anogenital location:  Bartholin's gland Pre-procedure details:    Skin preparation:  Antiseptic wash Anesthesia (see MAR for exact dosages):    Anesthesia method:  Local infiltration   Local anesthetic:  Lidocaine 1% w/o epi Procedure type:    Complexity:  Simple Procedure details:    Needle aspiration: no     Incision types:  Single straight   Incision depth:  Submucosal   Scalpel blade:  11   Wound management:  Irrigated with saline and probed and deloculated   Drainage amount:  Copious   Wound treatment:  Drain placed   Packing materials:  Word catheter Post-procedure details:    Patient tolerance of procedure:  Tolerated well, no immediate complications   (including critical care time)  Medications Ordered in ED Medications  lidocaine (PF) (XYLOCAINE) 1 % injection 5 mL (5 mLs Infiltration Given  10/09/17 1634)     Initial Impression / Assessment and Plan / ED Course  I have reviewed the triage vital signs and the nursing notes.  Pertinent labs & imaging results that were available during my care of the patient were reviewed by me and considered in my medical decision making (see chart for details).     Patient with Bartholin's abscess on the left side.  Drained and Ward catheter placed in the ER.  Will follow up with Dr. Clearance CootsHarper for further definitive management.    Final Clinical Impressions(s) / ED Diagnoses  Final diagnoses:  Bartholin's gland abscess    ED Discharge Orders        Ordered    HYDROcodone-acetaminophen (NORCO/VICODIN) 5-325 MG tablet  Every 4 hours PRN     10/09/17 1640       Benjiman Core, MD 10/09/17 1648

## 2017-10-13 ENCOUNTER — Ambulatory Visit: Payer: Medicaid Other | Admitting: Obstetrics

## 2017-10-15 ENCOUNTER — Ambulatory Visit: Payer: Medicaid Other | Admitting: Obstetrics

## 2017-10-28 ENCOUNTER — Ambulatory Visit: Payer: Medicaid Other | Admitting: Obstetrics

## 2017-10-28 ENCOUNTER — Other Ambulatory Visit: Payer: Self-pay | Admitting: Obstetrics

## 2017-10-28 DIAGNOSIS — A6004 Herpesviral vulvovaginitis: Secondary | ICD-10-CM

## 2017-11-12 ENCOUNTER — Ambulatory Visit: Payer: Medicaid Other | Admitting: Obstetrics

## 2017-11-18 ENCOUNTER — Ambulatory Visit: Payer: Self-pay | Admitting: Obstetrics

## 2017-11-24 ENCOUNTER — Encounter: Payer: Self-pay | Admitting: Obstetrics

## 2017-11-24 ENCOUNTER — Ambulatory Visit (INDEPENDENT_AMBULATORY_CARE_PROVIDER_SITE_OTHER): Payer: Self-pay | Admitting: Obstetrics

## 2017-11-24 VITALS — BP 130/84 | HR 91 | Wt 154.8 lb

## 2017-11-24 DIAGNOSIS — A6004 Herpesviral vulvovaginitis: Secondary | ICD-10-CM

## 2017-11-24 DIAGNOSIS — N751 Abscess of Bartholin's gland: Secondary | ICD-10-CM

## 2017-11-24 DIAGNOSIS — N898 Other specified noninflammatory disorders of vagina: Secondary | ICD-10-CM

## 2017-11-24 DIAGNOSIS — Z113 Encounter for screening for infections with a predominantly sexual mode of transmission: Secondary | ICD-10-CM

## 2017-11-24 MED ORDER — VALACYCLOVIR HCL 1 G PO TABS: ORAL_TABLET | ORAL | 11 refills | Status: DC

## 2017-11-24 MED ORDER — CLINDAMYCIN HCL 300 MG PO CAPS: 300.0000 mg | ORAL_CAPSULE | Freq: Three times a day (TID) | ORAL | 1 refills | Status: DC

## 2017-11-24 MED ORDER — IBUPROFEN 800 MG PO TABS: 800.0000 mg | ORAL_TABLET | Freq: Three times a day (TID) | ORAL | 5 refills | Status: DC | PRN

## 2017-11-24 NOTE — Progress Notes (Signed)
Patient ID: Carrie Nielsen, female   DOB: 08/10/1991, 27 y.o.   MRN: [id number]  Chief Complaint  Patient presents with  . Follow-up    HPI Carrie PoliShequan Navarra is a 27 y.o. female.  History of Bartholin Gland Abscess.  S/P I&D and Word Catheter Placement ~ 6 weeks ago.  Presents today for follow up.  Has mild tenderness at incision. HPI  Past Medical History:  Diagnosis Date  . History of cold sores   . Medical history non-contributory     Past Surgical History:  Procedure Laterality Date  . NO PAST SURGERIES    . WISDOM TOOTH EXTRACTION      Family History  Problem Relation Age of Onset  . Cancer Maternal Grandfather        brain  . Alcohol abuse Neg Hx   . Arthritis Neg Hx   . Asthma Neg Hx   . Birth defects Neg Hx   . COPD Neg Hx   . Depression Neg Hx   . Diabetes Neg Hx   . Early death Neg Hx   . Drug abuse Neg Hx   . Hearing loss Neg Hx   . Heart disease Neg Hx   . Hyperlipidemia Neg Hx   . Hypertension Neg Hx   . Kidney disease Neg Hx   . Learning disabilities Neg Hx   . Mental illness Neg Hx   . Mental retardation Neg Hx   . Miscarriages / Stillbirths Neg Hx   . Stroke Neg Hx   . Vision loss Neg Hx     Social History Social History   Tobacco Use  . Smoking status: Never Smoker  . Smokeless tobacco: Never Used  Substance Use Topics  . Alcohol use: No    Alcohol/week: 0.0 oz  . Drug use: No    No Known Allergies  Current Outpatient Medications  Medication Sig Dispense Refill  . Etonogestrel (NEXPLANON [place]) Inject 1 application into the skin once.    . fluocinonide-emollient (LIDEX-E) 0.05 % cream Apply 1 application topically 2 (two) times daily. 60 g 2  . ibuprofen (ADVIL,MOTRIN) 800 MG tablet Take 1 tablet (800 mg total) by mouth every 8 (eight) hours as needed. 30 tablet 5  . valACYclovir (VALTREX) 1000 MG tablet Take 1 tablet po bid prn 30 tablet 11  . clindamycin (CLEOCIN) 300 MG capsule Take 1 capsule (300 mg total) by mouth every 8 (eight)  hours. 30 capsule 1  . ibuprofen (ADVIL,MOTRIN) 800 MG tablet Take 1 tablet (800 mg total) by mouth every 8 (eight) hours as needed. 30 tablet 5   No current facility-administered medications for this visit.     Review of Systems Review of Systems Constitutional: negative for fatigue and weight loss Respiratory: negative for cough and wheezing Cardiovascular: negative for chest pain, fatigue and palpitations Gastrointestinal: negative for abdominal pain and change in bowel habits Genitourinary:negative Integument/breast: negative for nipple discharge Musculoskeletal:negative for myalgias Neurological: negative for gait problems and tremors Behavioral/Psych: negative for abusive relationship, depression Endocrine: negative for temperature intolerance      Blood pressure 130/84, pulse 91, weight 154 lb 12.8 oz (70.2 kg), last menstrual period 10/27/2017, currently breastfeeding.  Physical Exam Physical Exam           General:  Alert and no distress Abdomen:  normal findings: no organomegaly, soft, non-tender and no hernia  Pelvis:  External genitalia: normal general appearance.  Left incision site of Bartholin Cyst is healing well, and is clean, dry and nontender  Urinary system: urethral meatus normal and bladder without fullness, nontender Vaginal: normal without tenderness, induration or masses Cervix: normal appearance Adnexa: normal bimanual exam Uterus: anteverted and non-tender, normal size    50% of 15 min visit spent on counseling and coordination of care.   Data Reviewed Labs  Assessment     1. Bartholin's gland abscess.  S/P I&D and Word Catheter Placement - doing well Rx: - clindamycin (CLEOCIN) 300 MG capsule; Take 1 capsule (300 mg total) by mouth every 8 (eight) hours.  Dispense: 30 capsule; Refill: 1 - ibuprofen (ADVIL,MOTRIN) 800 MG tablet; Take 1 tablet (800 mg total) by mouth every 8 (eight) hours as needed.  Dispense: 30 tablet; Refill: 5  2. Vaginal  discharge Rx: - Cervicovaginal ancillary only  3. Herpes simplex vulvovaginitis Rx: - valACYclovir (VALTREX) 1000 MG tablet; Take 1 tablet po bid prn  Dispense: 30 tablet; Refill: 11    Plan    Follow up in 6 weeks for Annual and Pap  No orders of the defined types were placed in this encounter.  Meds ordered this encounter  Medications  . clindamycin (CLEOCIN) 300 MG capsule    Sig: Take 1 capsule (300 mg total) by mouth every 8 (eight) hours.    Dispense:  30 capsule    Refill:  1  . valACYclovir (VALTREX) 1000 MG tablet    Sig: Take 1 tablet po bid prn    Dispense:  30 tablet    Refill:  11    Please consider 90 day supplies to promote better adherence  . ibuprofen (ADVIL,MOTRIN) 800 MG tablet    Sig: Take 1 tablet (800 mg total) by mouth every 8 (eight) hours as needed.    Dispense:  30 tablet    Refill:  5    Brock Bad MD

## 2017-11-25 ENCOUNTER — Other Ambulatory Visit: Payer: Self-pay | Admitting: Obstetrics

## 2017-11-25 LAB — CERVICOVAGINAL ANCILLARY ONLY
Bacterial vaginitis: POSITIVE — AB
Candida vaginitis: NEGATIVE
Chlamydia: NEGATIVE
NEISSERIA GONORRHEA: NEGATIVE
TRICH (WINDOWPATH): NEGATIVE

## 2017-11-28 ENCOUNTER — Encounter (HOSPITAL_COMMUNITY): Payer: Self-pay | Admitting: Emergency Medicine

## 2017-11-28 ENCOUNTER — Emergency Department (HOSPITAL_COMMUNITY)
Admission: EM | Admit: 2017-11-28 | Discharge: 2017-11-28 | Disposition: A | Discharge status: Home / Self Care | Payer: Self-pay | Attending: Emergency Medicine | Admitting: Emergency Medicine

## 2017-11-28 DIAGNOSIS — B002 Herpesviral gingivostomatitis and pharyngotonsillitis: Secondary | ICD-10-CM | POA: Insufficient documentation

## 2017-11-28 DIAGNOSIS — Z79899 Other long term (current) drug therapy: Secondary | ICD-10-CM | POA: Insufficient documentation

## 2017-11-28 MED ORDER — CEPHALEXIN 250 MG PO CAPS
250.0000 mg | ORAL_CAPSULE | Freq: Once | ORAL | Status: AC
  Administered 2017-11-28: 250 mg via ORAL
  Filled 2017-11-28: qty 1

## 2017-11-28 MED ORDER — IBUPROFEN 200 MG PO TABS
600.0000 mg | ORAL_TABLET | Freq: Once | ORAL | Status: AC
  Administered 2017-11-28: 600 mg via ORAL
  Filled 2017-11-28: qty 3

## 2017-11-28 MED ORDER — VALACYCLOVIR HCL 1 G PO TABS: 2000.0000 mg | ORAL_TABLET | Freq: Two times a day (BID) | ORAL | 0 refills | Status: AC

## 2017-11-28 MED ORDER — CEPHALEXIN 250 MG PO CAPS: 250.0000 mg | ORAL_CAPSULE | Freq: Four times a day (QID) | ORAL | 0 refills | Status: DC

## 2017-11-28 NOTE — ED Triage Notes (Signed)
Patient reports "herpes flare up" to right upper lip. Swelling noted to upper lip. Speaking in full sentences without difficulty.

## 2017-11-28 NOTE — Discharge Instructions (Signed)
Take valacyclovir as prescribed. Take Keflex as prescribed. Use ibuprofen 3 times daily for pain and swelling. Use ice as needed for pain and swelling. Follow-up with your primary care or OB/GYN for further evaluation of your symptoms if they are not improving. Return to the emergency room if you develop fevers, difficulty breathing, or any new or concerning symptoms.

## 2017-11-28 NOTE — ED Provider Notes (Signed)
[facility]-EMERGENCY DEPT Provider Note   CSN: [id number] Arrival date & time: 11/28/17  1122     History   Chief Complaint Chief Complaint  Patient presents with  . Rash    HPI Carrie Nielsen is a 27 y.o. female presenting with rash of her upper lip.  Patient states she started to develop lip lesions and swelling 2 days ago.  Since then, it has become more swollen and painful.  She has a history of oral herpes, and states this feels like a more severe outbreak.  She has mild clear drainage from the lesions.  She denies fevers, chills, difficulty swallowing, or rash elsewhere.  She was seen by OB/GYN last week and prescribed valacyclovir and clindamycin, which she did not pick up due to cost.  She has not been putting anything on her lips.  She has been taking Advil without improvement of pain.   HPI  Past Medical History:  Diagnosis Date  . History of cold sores   . Medical history non-contributory     Patient Active Problem List   Diagnosis Date Noted  . Post-dates pregnancy 02/11/2016  . Status post vacuum-assisted vaginal delivery 02/11/2016  . Chlamydia infection 06/16/2014  . 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 Esophageal reflux 02/08/2013    Past Surgical History:  Procedure Laterality Date  . NO PAST SURGERIES    . WISDOM TOOTH EXTRACTION      OB History    Gravida Para Term Preterm AB Living   2 2 2     2    SAB TAB Ectopic Multiple Live Births         0 2       Home Medications    Prior to Admission medications   Medication Sig Start Date End Date Taking? Authorizing Provider  cephALEXin (KEFLEX) 250 MG capsule Take 1 capsule (250 mg total) by mouth 4 (four) times daily. 11/28/17   Radames Mejorado, PA-C  clindamycin (CLEOCIN) 300 MG capsule Take 1 capsule (300 mg total) by mouth every 8 (eight) hours. 11/24/17   Brock BadHarper, Charles A, MD  Etonogestrel [facility](NEXPLANON [place]) Inject 1 application into the skin once.     [provider]  fluocinonide-emollient (LIDEX-E) 0.05 % cream Apply 1 application topically 2 (two) times daily. 10/22/16   Brock BadHarper, Charles A, MD  ibuprofen (ADVIL,MOTRIN) 800 MG tablet Take 1 tablet (800 mg total) by mouth every 8 (eight) hours as needed. 06/04/16   Brock BadHarper, Charles A, MD  ibuprofen (ADVIL,MOTRIN) 800 MG tablet Take 1 tablet (800 mg total) by mouth every 8 (eight) hours as needed. 11/24/17   Brock BadHarper, Charles A, MD  valACYclovir (VALTREX) 1000 MG tablet Take 1 tablet po bid prn 11/24/17   Brock BadHarper, Charles A, MD  valACYclovir (VALTREX) 1000 MG tablet Take 2 tablets (2,000 mg total) by mouth 2 (two) times daily for 1 day. 11/28/17 11/29/17  Kween Bacorn, PA-C    Family History Family History  Problem Relation Age of Onset  . Cancer Maternal Grandfather        brain  . Alcohol abuse Neg Hx   . Arthritis Neg Hx   . Asthma Neg Hx   . Birth defects Neg Hx   . COPD Neg Hx   . Depression Neg Hx   . Diabetes Neg Hx   . Early death Neg Hx   . Drug abuse Neg Hx   . Hearing loss Neg Hx   . Heart disease  Neg Hx   . Hyperlipidemia Neg Hx   . Hypertension Neg Hx   . Kidney disease Neg Hx   . Learning disabilities Neg Hx   . Mental illness Neg Hx   . Mental retardation Neg Hx   . Miscarriages / Stillbirths Neg Hx   . Stroke Neg Hx   . Vision loss Neg Hx     Social History Social History   Tobacco Use  . Smoking status: Never Smoker  . Smokeless tobacco: Never Used  Substance Use Topics  . Alcohol use: No    Alcohol/week: 0.0 oz  . Drug use: No     Allergies   Patient has no known allergies.   Review of Systems Review of Systems  Constitutional: Negative for chills and fever.  HENT:       Upper lip lesions and swelling     Physical Exam Updated Vital Signs BP 126/78 (BP Location: Left Arm)   Pulse 88   Temp 98.1 F (36.7 C) (Oral)   Resp 16   LMP 10/27/2017   SpO2 99%   Physical Exam  Constitutional: She is oriented to person, place,  and time. She appears well-developed and well-nourished. No distress.  HENT:  Head: Normocephalic and atraumatic.  Mouth/Throat: Uvula is midline, oropharynx is clear and moist and mucous membranes are normal. Oral lesions present.  Multiple vesicular lesions of the upper lip with clear drainage.  Surrounding erythema with possible crusting.  No lesions noted elsewhere.  Eyes: EOM are normal.  Neck: Normal range of motion.  Pulmonary/Chest: Effort normal.  Abdominal: She exhibits no distension.  Musculoskeletal: Normal range of motion.  Neurological: She is alert and oriented to person, place, and time.  Skin: Skin is warm. No rash noted.  Psychiatric: She has a normal mood and affect.  Nursing note and vitals reviewed.    ED Treatments / Results  Labs (all labs ordered are listed, but only abnormal results are displayed) Labs Reviewed - No data to display  EKG  EKG Interpretation None       Radiology No results found.  Procedures Procedures (including critical care time)  Medications Ordered in ED Medications  ibuprofen (ADVIL,MOTRIN) tablet 600 mg (600 mg Oral Given 11/28/17 1416)  cephALEXin (KEFLEX) capsule 250 mg (250 mg Oral Given 11/28/17 1416)     Initial Impression / Assessment and Plan / ED Course  I have reviewed the triage vital signs and the nursing notes.  Pertinent labs & imaging results that were available during my care of the patient were reviewed by me and considered in my medical decision making (see chart for details).     Patient presenting for evaluation of lip lesions and swelling.  Physical exam shows multiple vesicular lesions with possible crusting and clear drainage.  Likely oral herpes with possible secondary infection.  Will treat with valacyclovir and Keflex.  Ibuprofen for pain and swelling.  Discussed with patient.  Discussed follow-up with OB/GYN as needed.  At this time, patient appears safe for discharge.  Return precautions given.   Patient states she understands and agrees to plan.   Final Clinical Impressions(s) / ED Diagnoses   Final diagnoses:  Oral herpes    ED Discharge Orders        Ordered    cephALEXin (KEFLEX) 250 MG capsule  4 times daily     11/28/17 1411    valACYclovir (VALTREX) 1000 MG tablet  2 times daily     11/28/17 1411  Alveria Apley, PA-C 11/28/17 2228    Maia Plan, MD 11/29/17 [phone]

## 2018-01-10 ENCOUNTER — Ambulatory Visit: Payer: Self-pay | Admitting: Obstetrics

## 2018-03-13 ENCOUNTER — Emergency Department (HOSPITAL_COMMUNITY)
Admission: EM | Admit: 2018-03-13 | Discharge: 2018-03-13 | Disposition: A | Discharge status: Home / Self Care | Payer: Self-pay | Attending: Emergency Medicine | Admitting: Emergency Medicine

## 2018-03-13 ENCOUNTER — Inpatient Hospital Stay (HOSPITAL_COMMUNITY)
Admission: AD | Admit: 2018-03-13 | Discharge: 2018-03-13 | Disposition: A | Discharge status: Home / Self Care | Payer: Self-pay | Source: Ambulatory Visit | Attending: Obstetrics & Gynecology | Admitting: Obstetrics & Gynecology

## 2018-03-13 ENCOUNTER — Encounter (HOSPITAL_COMMUNITY): Payer: Self-pay

## 2018-03-13 DIAGNOSIS — Z5321 Procedure and treatment not carried out due to patient leaving prior to being seen by health care provider: Secondary | ICD-10-CM | POA: Insufficient documentation

## 2018-03-13 DIAGNOSIS — Z3202 Encounter for pregnancy test, result negative: Secondary | ICD-10-CM | POA: Insufficient documentation

## 2018-03-13 DIAGNOSIS — R0789 Other chest pain: Secondary | ICD-10-CM | POA: Insufficient documentation

## 2018-03-13 DIAGNOSIS — R0781 Pleurodynia: Secondary | ICD-10-CM | POA: Insufficient documentation

## 2018-03-13 LAB — URINALYSIS, ROUTINE W REFLEX MICROSCOPIC
BACTERIA UA: NONE SEEN
BILIRUBIN URINE: NEGATIVE
Glucose, UA: NEGATIVE mg/dL
KETONES UR: NEGATIVE mg/dL
LEUKOCYTES UA: NEGATIVE
Nitrite: NEGATIVE
Protein, ur: NEGATIVE mg/dL
SPECIFIC GRAVITY, URINE: 1.026 (ref 1.005–1.030)
pH: 7 (ref 5.0–8.0)

## 2018-03-13 LAB — POCT PREGNANCY, URINE: PREG TEST UR: NEGATIVE
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
## 2018-03-13 NOTE — ED Notes (Signed)
Patient reports that she has to leave and catch the bus

## 2018-03-13 NOTE — ED Notes (Signed)
Pt wanted to be seen within 15 minutes, pt was informed that wasn't possible, pt left

## 2018-03-25 ENCOUNTER — Other Ambulatory Visit: Payer: Self-pay

## 2018-03-25 ENCOUNTER — Emergency Department (HOSPITAL_COMMUNITY)
Admission: EM | Admit: 2018-03-25 | Discharge: 2018-03-25 | Disposition: A | Discharge status: Home / Self Care | Payer: Self-pay | Attending: Emergency Medicine | Admitting: Emergency Medicine

## 2018-03-25 ENCOUNTER — Emergency Department (HOSPITAL_COMMUNITY): Payer: Self-pay

## 2018-03-25 ENCOUNTER — Encounter (HOSPITAL_COMMUNITY): Payer: Self-pay

## 2018-03-25 DIAGNOSIS — S2232XA Fracture of one rib, left side, initial encounter for closed fracture: Secondary | ICD-10-CM | POA: Insufficient documentation

## 2018-03-25 DIAGNOSIS — X58XXXA Exposure to other specified factors, initial encounter: Secondary | ICD-10-CM | POA: Insufficient documentation

## 2018-03-25 DIAGNOSIS — J069 Acute upper respiratory infection, unspecified: Secondary | ICD-10-CM | POA: Insufficient documentation

## 2018-03-25 DIAGNOSIS — Y939 Activity, unspecified: Secondary | ICD-10-CM | POA: Insufficient documentation

## 2018-03-25 DIAGNOSIS — Y999 Unspecified external cause status: Secondary | ICD-10-CM | POA: Insufficient documentation

## 2018-03-25 DIAGNOSIS — Z79899 Other long term (current) drug therapy: Secondary | ICD-10-CM | POA: Insufficient documentation

## 2018-03-25 DIAGNOSIS — Y929 Unspecified place or not applicable: Secondary | ICD-10-CM | POA: Insufficient documentation

## 2018-03-25 DIAGNOSIS — B9789 Other viral agents as the cause of diseases classified elsewhere: Secondary | ICD-10-CM

## 2018-03-25 MED ORDER — NAPROXEN 500 MG PO TABS: 500.0000 mg | ORAL_TABLET | Freq: Two times a day (BID) | ORAL | 0 refills | Status: DC

## 2018-03-25 MED ORDER — HYDROCODONE-ACETAMINOPHEN 5-325 MG PO TABS: 1.0000 | ORAL_TABLET | ORAL | 0 refills | Status: DC | PRN

## 2018-03-25 MED ORDER — HYDROCODONE-ACETAMINOPHEN 5-325 MG PO TABS
1.0000 | ORAL_TABLET | Freq: Once | ORAL | Status: AC
  Administered 2018-03-25: 1 via ORAL
  Filled 2018-03-25: qty 1

## 2018-03-25 MED ORDER — NAPROXEN 500 MG PO TABS
500.0000 mg | ORAL_TABLET | Freq: Once | ORAL | Status: AC
  Administered 2018-03-25: 500 mg via ORAL
  Filled 2018-03-25: qty 1

## 2018-03-25 NOTE — ED Triage Notes (Signed)
Pt is alert and oriented x 4 and is verbally responsive. PT reports that she has had a productive  cough x pt reports yellow sputum. Pt denies fever chills, SOB, or pain at this time.

## 2018-03-25 NOTE — Discharge Instructions (Addendum)
Your x-ray shows a small nondisplaced fracture of the left ninth rib.  Please use pain medication as needed.  It is also extremely important that you do incentive spirometry at least 4 times a day this helps ensure that you are taking good deep breaths to prevent pneumonia.  You may also use ice or heat over the area.  Please follow-up with the [facility] for continued evaluation.  Return to the emergency department if any of the below scenarios develop.  Your symptoms are likely caused by a viral upper respiratory infection. Antibiotics are not helpful in treating viral infection, the virus should run its course in about 5-7 days. Please make sure you are drinking plenty of fluids. You can treat your symptoms supportively with tylenol/ibuprofen for fevers and pains, Zyrtec and Flonase to heal with nasal congestion, and over the counter cough syrups and throat lozenges to help with cough. If your symptoms are not improving please follow up with you Primary doctor.    Get help right away if: You have a fever. You have trouble breathing. You cannot stop coughing. You cough up thick or bloody spit (mucus). You feel sick to your stomach (nauseous), throw up (vomit), or have belly (abdominal) pain. Your pain gets worse and medicine does not help.
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
## 2018-06-21 ENCOUNTER — Encounter (HOSPITAL_COMMUNITY): Payer: Self-pay

## 2018-06-21 ENCOUNTER — Other Ambulatory Visit: Payer: Self-pay

## 2018-06-21 ENCOUNTER — Emergency Department (HOSPITAL_COMMUNITY)
Admission: EM | Admit: 2018-06-21 | Discharge: 2018-06-21 | Disposition: A | Discharge status: Home / Self Care | Payer: Self-pay | Attending: Emergency Medicine | Admitting: Emergency Medicine

## 2018-06-21 DIAGNOSIS — B002 Herpesviral gingivostomatitis and pharyngotonsillitis: Secondary | ICD-10-CM | POA: Insufficient documentation

## 2018-06-21 MED ORDER — VALACYCLOVIR HCL 1 G PO TABS: 1000.0000 mg | ORAL_TABLET | Freq: Every day | ORAL | 0 refills | Status: AC

## 2018-06-21 MED ORDER — VALACYCLOVIR HCL 500 MG PO TABS
1000.0000 mg | ORAL_TABLET | Freq: Once | ORAL | Status: AC
  Administered 2018-06-21: 1000 mg via ORAL
  Filled 2018-06-21: qty 2

## 2018-06-21 NOTE — ED Triage Notes (Signed)
Pt c/o oral herpes outbreak x4 days. Pt uses valtrex. Denies pain or any other complaints.

## 2018-06-21 NOTE — ED Provider Notes (Signed)
[facility]-EMERGENCY DEPT Provider Note   CSN: [id number] Arrival date & time: 06/21/18  1812     History   Chief Complaint Chief Complaint  Patient presents with  . Rash    HPI Carrie Nielsen is a 27 y.o. female presented for evaluation of oral herpes.  Patient states Sunday night, she started to develop symptoms of early oral herpes.  When she woke up on Monday, she had multiple small fluid-filled vesicles of her upper lip.  She states she used to take Valtrex for this, but recently lost insurance and can no longer afford the prescription.  She is currently not on any antivirals.  She denies fevers, chills, or rash elsewhere.  She denies lip, tongue, or mouth swelling.  She has no other medical problems, takes no medications daily.  She states the bumps are painful.  They have been draining clear fluid.  HPI  Past Medical History:  Diagnosis Date  . History of cold sores   . Medical history non-contributory     Patient Active Problem List   Diagnosis Date Noted  . Post-dates pregnancy 02/11/2016  . Status post vacuum-assisted vaginal delivery 02/11/2016  . Chlamydia infection 06/16/2014  . 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 Esophageal reflux 02/08/2013    Past Surgical History:  Procedure Laterality Date  . NO PAST SURGERIES    . WISDOM TOOTH EXTRACTION       OB History    Gravida  2   Para  2   Term  2   Preterm      AB      Living  2     SAB      TAB      Ectopic      Multiple  0   Live Births  2            Home Medications    Prior to Admission medications   Medication Sig Start Date End Date Taking? Authorizing Provider  cephALEXin (KEFLEX) 250 MG capsule Take 1 capsule (250 mg total) by mouth 4 (four) times daily. 11/28/17   Zaki Gertsch, PA-C  clindamycin (CLEOCIN) 300 MG capsule Take 1 capsule (300 mg total) by mouth every 8 (eight) hours. 11/24/17   Brock BadHarper, Charles A, MD    Etonogestrel [facility](NEXPLANON Eugenio Saenz) Inject 1 application into the skin once.    [provider]  fluocinonide-emollient (LIDEX-E) 0.05 % cream Apply 1 application topically 2 (two) times daily. 10/22/16   Brock BadHarper, Charles A, MD  HYDROcodone-acetaminophen (NORCO) 5-325 MG tablet Take 1 tablet by mouth every 4 (four) hours as needed. 03/25/18   Dartha LodgeFord, Kelsey N, PA-C  ibuprofen (ADVIL,MOTRIN) 800 MG tablet Take 1 tablet (800 mg total) by mouth every 8 (eight) hours as needed. 06/04/16   Brock BadHarper, Charles A, MD  ibuprofen (ADVIL,MOTRIN) 800 MG tablet Take 1 tablet (800 mg total) by mouth every 8 (eight) hours as needed. 11/24/17   Brock BadHarper, Charles A, MD  naproxen (NAPROSYN) 500 MG tablet Take 1 tablet (500 mg total) by mouth 2 (two) times daily. 03/25/18   Dartha LodgeFord, Kelsey N, PA-C  valACYclovir (VALTREX) 1000 MG tablet Take 1 tablet (1,000 mg total) by mouth daily for 4 days. 06/21/18 06/25/18  Anie Juniel, PA-C    Family History Family History  Problem Relation Age of Onset  . Cancer Maternal Grandfather        brain  . Alcohol abuse Neg Hx   . Arthritis Neg Hx   .  Asthma Neg Hx   . Birth defects Neg Hx   . COPD Neg Hx   . Depression Neg Hx   . Diabetes Neg Hx   . Early death Neg Hx   . Drug abuse Neg Hx   . Hearing loss Neg Hx   . Heart disease Neg Hx   . Hyperlipidemia Neg Hx   . Hypertension Neg Hx   . Kidney disease Neg Hx   . Learning disabilities Neg Hx   . Mental illness Neg Hx   . Mental retardation Neg Hx   . Miscarriages / Stillbirths Neg Hx   . Stroke Neg Hx   . Vision loss Neg Hx     Social History Social History   Tobacco Use  . Smoking status: Never Smoker  . Smokeless tobacco: Never Used  Substance Use Topics  . Alcohol use: No    Alcohol/week: 0.0 standard drinks  . Drug use: No     Allergies   Patient has no known allergies.   Review of Systems Review of Systems  Constitutional: Negative for fever.  HENT:       Oral lesions     Physical  Exam Updated Vital Signs BP 124/70 (BP Location: Left Arm)   Pulse 64   Temp 97.9 F (36.6 C) (Oral)   Resp 16   Ht 5\' 10"  (1.778 m)   Wt 65.8 kg   LMP 05/23/2018   SpO2 100%   BMI 20.81 kg/m   Physical Exam  Constitutional: She is oriented to person, place, and time. She appears well-developed and well-nourished. No distress.  HENT:  Head: Normocephalic and atraumatic.  Mouth/Throat:    Small fluid-filled vesicles of the upper lip without purulent drainage.  No vesicles noted elsewhere.  No surrounding erythema or signs of infection.  Eyes: EOM are normal.  Neck: Normal range of motion.  Cardiovascular: Normal rate, regular rhythm and intact distal pulses.  Pulmonary/Chest: Effort normal and breath sounds normal. No respiratory distress. She has no wheezes.  Abdominal: She exhibits no distension.  Musculoskeletal: Normal range of motion.  Neurological: She is alert and oriented to person, place, and time.  Skin: Skin is warm. No rash noted.  Psychiatric: She has a normal mood and affect.  Nursing note and vitals reviewed.    ED Treatments / Results  Labs (all labs ordered are listed, but only abnormal results are displayed) Labs Reviewed - No data to display  EKG None  Radiology No results found.  Procedures Procedures (including critical care time)  Medications Ordered in ED Medications  valACYclovir (VALTREX) tablet 1,000 mg (has no administration in time range)     Initial Impression / Assessment and Plan / ED Course  I have reviewed the triage vital signs and the nursing notes.  Pertinent labs & imaging results that were available during my care of the patient were reviewed by me and considered in my medical decision making (see chart for details).     Patient presenting for evaluation of oral lesion.  Exam consistent with oral herpes outbreak.  Patient with a history of the same.  Not currently on any immunosuppression.  Physical exam without signs  of superimposed infection.  Will give first dose of Valtrex here, prescription for Valtrex with coupon at discharge.  Patient to follow-up as needed.  At this time, patient appears safe for discharge.  Return precautions given.  Patient states she understands and agrees plan.   Final Clinical Impressions(s) / ED Diagnoses  Final diagnoses:  Oral herpes    ED Discharge Orders         Ordered    valACYclovir (VALTREX) 1000 MG tablet  Daily     06/21/18 1900           Alveria ApleyCaccavale, Noach Calvillo, PA-C 06/21/18 1918    Margarita Grizzleay, Danielle, MD 06/23/18 [id number]

## 2018-06-21 NOTE — Discharge Instructions (Signed)
Take Valtrex for the next 4 days. Do not touch your mouth. Return if you have any new, worsening, or concerning symptoms.

## 2018-06-23 ENCOUNTER — Ambulatory Visit: Payer: Self-pay | Admitting: Obstetrics

## 2018-10-24 ENCOUNTER — Emergency Department (HOSPITAL_COMMUNITY)
Admission: EM | Admit: 2018-10-24 | Discharge: 2018-10-24 | Disposition: A | Discharge status: Home / Self Care | Payer: Self-pay | Attending: Emergency Medicine | Admitting: Emergency Medicine

## 2018-10-24 ENCOUNTER — Encounter (HOSPITAL_COMMUNITY): Payer: Self-pay

## 2018-10-24 ENCOUNTER — Other Ambulatory Visit: Payer: Self-pay

## 2018-10-24 DIAGNOSIS — Z79899 Other long term (current) drug therapy: Secondary | ICD-10-CM | POA: Insufficient documentation

## 2018-10-24 DIAGNOSIS — J101 Influenza due to other identified influenza virus with other respiratory manifestations: Secondary | ICD-10-CM

## 2018-10-24 LAB — INFLUENZA PANEL BY PCR (TYPE A & B)
INFLAPCR: NEGATIVE
INFLBPCR: POSITIVE — AB

## 2018-10-24 LAB — GROUP A STREP BY PCR: Group A Strep by PCR: NOT DETECTED

## 2018-10-24 MED ORDER — IBUPROFEN 400 MG PO TABS
600.0000 mg | ORAL_TABLET | Freq: Once | ORAL | Status: AC
  Administered 2018-10-24: 600 mg via ORAL
  Filled 2018-10-24: qty 1

## 2018-10-24 MED ORDER — IBUPROFEN 600 MG PO TABS: 600.0000 mg | ORAL_TABLET | Freq: Four times a day (QID) | ORAL | 0 refills | Status: DC | PRN

## 2018-10-24 MED ORDER — DM-GUAIFENESIN ER 30-600 MG PO TB12: 1.0000 | ORAL_TABLET | Freq: Two times a day (BID) | ORAL | 0 refills | Status: DC

## 2018-10-24 NOTE — Discharge Instructions (Signed)
Please rest and drink plenty of fluids Take Ibuprofen and tylenol for pain and fever Take cough medicine as directed Return if worsening

## 2018-10-24 NOTE — ED Provider Notes (Signed)
[name] EMERGENCY DEPARTMENT Provider Note   CSN: [id number] Arrival date & time: 10/24/18  4098     History   Chief Complaint Chief Complaint  Patient presents with  . Generalized Body Aches    HPI Carrie Nielsen is a 27 y.o. female who presents with flu like symptoms. She states that she had an acute onset of fever/chills, sweats, and URI symptoms for the past 3 days. Her 56 year old son is also in the ED currently being evaluated and he started to get sick first. She reports a headache, productive cough, runny nose, and sore throat. She has been taking OTC meds without relief. She has not had a flu shot.  HPI  Past Medical History:  Diagnosis Date  . History of cold sores   . Medical history non-contributory     Patient Active Problem List   Diagnosis Date Noted  . Post-dates pregnancy 02/11/2016  . Status post vacuum-assisted vaginal delivery 02/11/2016  . Chlamydia infection 06/16/2014  . 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 Esophageal reflux 02/08/2013    Past Surgical History:  Procedure Laterality Date  . NO PAST SURGERIES    . WISDOM TOOTH EXTRACTION       OB History    Gravida  2   Para  2   Term  2   Preterm      AB      Living  2     SAB      TAB      Ectopic      Multiple  0   Live Births  2            Home Medications    Prior to Admission medications   Medication Sig Start Date End Date Taking? Authorizing Provider  cephALEXin (KEFLEX) 250 MG capsule Take 1 capsule (250 mg total) by mouth 4 (four) times daily. 11/28/17   Caccavale, Sophia, PA-C  clindamycin (CLEOCIN) 300 MG capsule Take 1 capsule (300 mg total) by mouth every 8 (eight) hours. 11/24/17   Brock Bad, MD  Etonogestrel [facility]) Inject 1 application into the skin once.    [provider]  fluocinonide-emollient (LIDEX-E) 0.05 % cream Apply 1 application topically 2 (two) times daily. 10/22/16   Brock Bad, MD  HYDROcodone-acetaminophen (NORCO) 5-325 MG tablet Take 1 tablet by mouth every 4 (four) hours as needed. 03/25/18   Dartha Lodge, PA-C  ibuprofen (ADVIL,MOTRIN) 800 MG tablet Take 1 tablet (800 mg total) by mouth every 8 (eight) hours as needed. 06/04/16   Brock Bad, MD  ibuprofen (ADVIL,MOTRIN) 800 MG tablet Take 1 tablet (800 mg total) by mouth every 8 (eight) hours as needed. 11/24/17   Brock Bad, MD  naproxen (NAPROSYN) 500 MG tablet Take 1 tablet (500 mg total) by mouth 2 (two) times daily. 03/25/18   Dartha Lodge, PA-C    Family History Family History  Problem Relation Age of Onset  . Cancer Maternal Grandfather        brain  . Alcohol abuse Neg Hx   . Arthritis Neg Hx   . Asthma Neg Hx   . Birth defects Neg Hx   . COPD Neg Hx   . Depression Neg Hx   . Diabetes Neg Hx   . Early death Neg Hx   . Drug abuse Neg Hx   . Hearing loss Neg Hx   . Heart disease Neg Hx   .  Hyperlipidemia Neg Hx   . Hypertension Neg Hx   . Kidney disease Neg Hx   . Learning disabilities Neg Hx   . Mental illness Neg Hx   . Mental retardation Neg Hx   . Miscarriages / Stillbirths Neg Hx   . Stroke Neg Hx   . Vision loss Neg Hx     Social History Social History   Tobacco Use  . Smoking status: Never Smoker  . Smokeless tobacco: Never Used  Substance Use Topics  . Alcohol use: No    Alcohol/week: 0.0 standard drinks  . Drug use: No     Allergies   Patient has no known allergies.   Review of Systems Review of Systems  Constitutional: Positive for chills, diaphoresis and fever.  HENT: Positive for congestion, rhinorrhea and sore throat. Negative for ear pain.   Respiratory: Positive for cough. Negative for shortness of breath.   Cardiovascular: Negative for chest pain.  Gastrointestinal: Negative for abdominal pain and vomiting.  Genitourinary: Negative for dysuria.     Physical Exam Updated Vital Signs BP 115/78 (BP Location: Right Arm)   Pulse  (!) 102   Temp 98.7 F (37.1 C) (Oral)   Resp 14   LMP 10/06/2018   SpO2 100%   Physical Exam Vitals signs and nursing note reviewed.  Constitutional:      General: She is not in acute distress.    Appearance: She is well-developed. She is ill-appearing (mildly).  HENT:     Head: Normocephalic and atraumatic.     Right Ear: Hearing, tympanic membrane, ear canal and external ear normal.     Left Ear: Hearing, tympanic membrane, ear canal and external ear normal.     Nose: Mucosal edema present.     Right Nostril: Epistaxis present.     Left Nostril: Epistaxis (dried blood) present.     Mouth/Throat:     Mouth: Mucous membranes are moist.     Pharynx: Oropharynx is clear.  Eyes:     General: No scleral icterus.       Right eye: No discharge.        Left eye: No discharge.     Conjunctiva/sclera: Conjunctivae normal.     Pupils: Pupils are equal, round, and reactive to light.  Neck:     Musculoskeletal: Normal range of motion.  Cardiovascular:     Rate and Rhythm: Regular rhythm. Tachycardia present.     Heart sounds: No murmur. No friction rub. No gallop.   Pulmonary:     Effort: Pulmonary effort is normal. No respiratory distress.  Abdominal:     General: There is no distension.  Skin:    General: Skin is warm and dry.  Neurological:     Mental Status: She is alert and oriented to person, place, and time.  Psychiatric:        Behavior: Behavior normal.      ED Treatments / Results  Labs (all labs ordered are listed, but only abnormal results are displayed) Labs Reviewed  INFLUENZA PANEL BY PCR (TYPE A & B) - Abnormal; Notable for the following components:      Result Value   Influenza B By PCR POSITIVE (*)    All other components within normal limits  GROUP A STREP BY PCR    EKG None  Radiology No results found.  Procedures Procedures (including critical care time)  Medications Ordered in ED Medications  ibuprofen (ADVIL,MOTRIN) tablet 600 mg (600  mg Oral Given 10/24/18 0901)  Initial Impression / Assessment and Plan / ED Course  I have reviewed the triage vital signs and the nursing notes.  Pertinent labs & imaging results that were available during my care of the patient were reviewed by me and considered in my medical decision making (see chart for details).  27 year old female presents with flu like symptoms. She has mild tachycardia but otherwise vitals are normal. She tested positive for influenza B. She is out of the window for Tamiflu. She was recommended to continue supportive care and work note was given.  Final Clinical Impressions(s) / ED Diagnoses   Final diagnoses:  Influenza B    ED Discharge Orders    None       Bethel Born, PA-C 10/24/18 1105    Geoffery Lyons, MD 10/24/18 1504

## 2018-10-24 NOTE — ED Triage Notes (Signed)
Pt reports cold/flu sx since Saturday. Has ben taking theraflu. Pt reports fever, body aches, chills. No distress noted. Afebrile in triage.

## 2019-01-31 ENCOUNTER — Telehealth: Payer: Self-pay | Admitting: Lactation Services

## 2019-01-31 NOTE — Telephone Encounter (Signed)
Provider called asking if pt has had a Pap completed. [facility] for her care.

## 2019-04-08 ENCOUNTER — Ambulatory Visit (HOSPITAL_COMMUNITY)
Admission: EM | Admit: 2019-04-08 | Discharge: 2019-04-08 | Disposition: A | Discharge status: Home / Self Care | Payer: Self-pay | Attending: Family Medicine | Admitting: Family Medicine

## 2019-04-08 ENCOUNTER — Encounter (HOSPITAL_COMMUNITY): Payer: Self-pay

## 2019-04-08 ENCOUNTER — Other Ambulatory Visit: Payer: Self-pay

## 2019-04-08 DIAGNOSIS — L03314 Cellulitis of groin: Secondary | ICD-10-CM

## 2019-04-08 MED ORDER — DOXYCYCLINE HYCLATE 100 MG PO TABS: 100.0000 mg | ORAL_TABLET | Freq: Two times a day (BID) | ORAL | 0 refills | Status: DC

## 2019-04-08 NOTE — ED Triage Notes (Signed)
Pt states that she scratched he labia 3 days ago and is now swollen and painful

## 2019-04-08 NOTE — ED Provider Notes (Signed)
[facility]    CSN: [id number] Arrival date & time: 04/08/19  1244     History   Chief Complaint Chief Complaint  Patient presents with  . Vaginal Pain    HPI Carrie Nielsen is a 28 y.o. female.   This is a 28 year old woman is making her first appearance to most [facility] urgent care in over 3 years.  Pt states that she scratched he labia 3 days ago and is now swollen and painful        Past Medical History:  Diagnosis Date  . History of cold sores   . Medical history non-contributory     Patient Active Problem List   Diagnosis Date Noted  . Post-dates pregnancy 02/11/2016  . Status post vacuum-assisted vaginal delivery 02/11/2016  . Chlamydia infection 06/16/2014  . 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 Esophageal reflux 02/08/2013    Past Surgical History:  Procedure Laterality Date  . NO PAST SURGERIES    . WISDOM TOOTH EXTRACTION      OB History    Gravida  2   Para  2   Term  2   Preterm      AB      Living  2     SAB      TAB      Ectopic      Multiple  0   Live Births  2            Home Medications    Prior to Admission medications   Medication Sig Start Date End Date Taking? Authorizing Provider  doxycycline (VIBRA-TABS) 100 MG tablet Take 1 tablet (100 mg total) by mouth 2 (two) times daily. 04/08/19   Elvina Sidle, MD  Etonogestrel (NEXPLANON [place]) Inject 1 application into the skin once.    [provider]  fluocinonide-emollient (LIDEX-E) 0.05 % cream Apply 1 application topically 2 (two) times daily. 10/22/16   Brock Bad, MD  ibuprofen (ADVIL,MOTRIN) 600 MG tablet Take 1 tablet (600 mg total) by mouth every 6 (six) hours as needed for fever, headache or moderate pain. 10/24/18   Bethel Born, PA-C  naproxen (NAPROSYN) 500 MG tablet Take 1 tablet (500 mg total) by mouth 2 (two) times daily. 03/25/18   Dartha Lodge, PA-C    Family History Family History  Problem  Relation Age of Onset  . Cancer Maternal Grandfather        brain  . Alcohol abuse Neg Hx   . Arthritis Neg Hx   . Asthma Neg Hx   . Birth defects Neg Hx   . COPD Neg Hx   . Depression Neg Hx   . Diabetes Neg Hx   . Early death Neg Hx   . Drug abuse Neg Hx   . Hearing loss Neg Hx   . Heart disease Neg Hx   . Hyperlipidemia Neg Hx   . Hypertension Neg Hx   . Kidney disease Neg Hx   . Learning disabilities Neg Hx   . Mental illness Neg Hx   . Mental retardation Neg Hx   . Miscarriages / Stillbirths Neg Hx   . Stroke Neg Hx   . Vision loss Neg Hx     Social History Social History   Tobacco Use  . Smoking status: Never Smoker  . Smokeless tobacco: Never Used  Substance Use Topics  . Alcohol use: No    Alcohol/week: 0.0 standard drinks  . Drug use:  No     Allergies   Patient has no known allergies.   Review of Systems Review of Systems  Genitourinary: Positive for vaginal pain.  All other systems reviewed and are negative.    Physical Exam Triage Vital Signs ED Triage Vitals  Enc Vitals Group     BP 04/08/19 1324 114/72     Pulse Rate 04/08/19 1324 (!) 105     Resp 04/08/19 1324 18     Temp 04/08/19 1324 98.4 F (36.9 C)     Temp src --      SpO2 04/08/19 1324 100 %     Weight --      Height --      Head Circumference --      Peak Flow --      Pain Score 04/08/19 1322 7     Pain Loc --      Pain Edu? --      Excl. in GC? --    No data found.  Updated Vital Signs BP 114/72   Pulse (!) 105   Temp 98.4 F (36.9 C)   Resp 18   LMP 03/23/2019   SpO2 100%    Physical Exam Vitals signs and nursing note reviewed.  Constitutional:      Appearance: Normal appearance.  HENT:     Nose: Nose normal.     Mouth/Throat:     Mouth: Mucous membranes are moist.  Eyes:     Conjunctiva/sclera: Conjunctivae normal.  Neck:     Musculoskeletal: Normal range of motion and neck supple.  Pulmonary:     Effort: Pulmonary effort is normal.  Genitourinary:     Comments: Mild swelling with left posterior labial induration and no fluctuance. Skin:    General: Skin is warm and dry.  Neurological:     Mental Status: She is alert.      UC Treatments / Results  Labs (all labs ordered are listed, but only abnormal results are displayed) Labs Reviewed - No data to display  EKG None  Radiology No results found.  Procedures Procedures (including critical care time)  Medications Ordered in UC Medications - No data to display  Initial Impression / Assessment and Plan / UC Course  I have reviewed the triage vital signs and the nursing notes.  Pertinent labs & imaging results that were available during my care of the patient were reviewed by me and considered in my medical decision making (see chart for details).    Final Clinical Impressions(s) / UC Diagnoses   Final diagnoses:  Cellulitis of groin     Discharge Instructions     Apply moist warm compresses to the left labia every 4 hours while awake.    ED Prescriptions    Medication Sig Dispense Auth. Provider   doxycycline (VIBRA-TABS) 100 MG tablet Take 1 tablet (100 mg total) by mouth 2 (two) times daily. 20 tablet Elvina SidleLauenstein, Richy Spradley, MD     Controlled Substance Prescriptions [place] Controlled Substance Registry consulted? Not Applicable   Elvina SidleLauenstein, Nyeemah Jennette, MD 04/08/19 1352

## 2019-04-08 NOTE — Discharge Instructions (Addendum)
Apply moist warm compresses to the left labia every 4 hours while awake.

## 2019-04-10 ENCOUNTER — Other Ambulatory Visit: Payer: Self-pay

## 2019-04-10 ENCOUNTER — Encounter (HOSPITAL_COMMUNITY): Payer: Self-pay | Admitting: Emergency Medicine

## 2019-04-10 ENCOUNTER — Emergency Department (HOSPITAL_COMMUNITY)
Admission: EM | Admit: 2019-04-10 | Discharge: 2019-04-10 | Disposition: A | Discharge status: Home / Self Care | Payer: Self-pay | Attending: Emergency Medicine | Admitting: Emergency Medicine

## 2019-04-10 DIAGNOSIS — N764 Abscess of vulva: Secondary | ICD-10-CM | POA: Insufficient documentation

## 2019-04-10 DIAGNOSIS — Z79899 Other long term (current) drug therapy: Secondary | ICD-10-CM | POA: Insufficient documentation

## 2019-04-10 LAB — POC URINE PREG, ED: Preg Test, Ur: NEGATIVE

## 2019-04-10 MED ORDER — OXYCODONE-ACETAMINOPHEN 5-325 MG PO TABS: 1.0000 | ORAL_TABLET | ORAL | 0 refills | Status: DC | PRN

## 2019-04-10 MED ORDER — LIDOCAINE-EPINEPHRINE (PF) 2 %-1:200000 IJ SOLN
10.0000 mL | Freq: Once | INTRAMUSCULAR | Status: AC
  Administered 2019-04-10: 10 mL
  Filled 2019-04-10: qty 20

## 2019-04-10 NOTE — ED Triage Notes (Signed)
Pt states she scratched her labia 4 days ago was seen at [facility] yesterday for the same- Pt was given doxycyline states irritation is no better today.

## 2019-04-10 NOTE — ED Provider Notes (Signed)
[name] EMERGENCY DEPARTMENT Provider Note   CSN: [id number] Arrival date & time: 04/10/19  0206    History   Chief Complaint Chief Complaint  Patient presents with  . Laceration    HPI Carrie Nielsen is a 28 y.o. female.     HPI  28 yo female presents with labial swelling and pain.  She states she shaved several days ago and then noted some irritation to labia.  She has noted increased swelling and pain with some discharge now. She denies any preceding vaginal discharge, urinary symptoms, nausea, vomiting, or fever.  LMP 5/14 normal and has implanon.   Past Medical History:  Diagnosis Date  . History of cold sores   . Medical history non-contributory     Patient Active Problem List   Diagnosis Date Noted  . Post-dates pregnancy 02/11/2016  . Status post vacuum-assisted vaginal delivery 02/11/2016  . Chlamydia infection 06/16/2014  . 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 Esophageal reflux 02/08/2013    Past Surgical History:  Procedure Laterality Date  . NO PAST SURGERIES    . WISDOM TOOTH EXTRACTION       OB History    Gravida  2   Para  2   Term  2   Preterm      AB      Living  2     SAB      TAB      Ectopic      Multiple  0   Live Births  2            Home Medications    Prior to Admission medications   Medication Sig Start Date End Date Taking? Authorizing Provider  doxycycline (VIBRA-TABS) 100 MG tablet Take 1 tablet (100 mg total) by mouth 2 (two) times daily. 04/08/19   Elvina Sidle, MD  Etonogestrel (NEXPLANON [place]) Inject 1 application into the skin once.    [provider]  fluocinonide-emollient (LIDEX-E) 0.05 % cream Apply 1 application topically 2 (two) times daily. 10/22/16   Brock Bad, MD  ibuprofen (ADVIL,MOTRIN) 600 MG tablet Take 1 tablet (600 mg total) by mouth every 6 (six) hours as needed for fever, headache or moderate pain. 10/24/18   Bethel Born,  PA-C  naproxen (NAPROSYN) 500 MG tablet Take 1 tablet (500 mg total) by mouth 2 (two) times daily. 03/25/18   Dartha Lodge, PA-C    Family History Family History  Problem Relation Age of Onset  . Cancer Maternal Grandfather        brain  . Alcohol abuse Neg Hx   . Arthritis Neg Hx   . Asthma Neg Hx   . Birth defects Neg Hx   . COPD Neg Hx   . Depression Neg Hx   . Diabetes Neg Hx   . Early death Neg Hx   . Drug abuse Neg Hx   . Hearing loss Neg Hx   . Heart disease Neg Hx   . Hyperlipidemia Neg Hx   . Hypertension Neg Hx   . Kidney disease Neg Hx   . Learning disabilities Neg Hx   . Mental illness Neg Hx   . Mental retardation Neg Hx   . Miscarriages / Stillbirths Neg Hx   . Stroke Neg Hx   . Vision loss Neg Hx     Social History Social History   Tobacco Use  . Smoking status: Never Smoker  . Smokeless tobacco: Never  Used  Substance Use Topics  . Alcohol use: No    Alcohol/week: 0.0 standard drinks  . Drug use: No     Allergies   Patient has no known allergies.   Review of Systems Review of Systems   Physical Exam Updated Vital Signs BP 112/66 (BP Location: Right Arm)   Pulse 77   Temp 98.7 F (37.1 C) (Oral)   Resp 18   LMP 03/23/2019   SpO2 100%   Physical Exam Vitals signs and nursing note reviewed.  Constitutional:      General: She is not in acute distress.    Appearance: Normal appearance. She is normal weight.  HENT:     Head: Normocephalic.     Right Ear: External ear normal.     Left Ear: External ear normal.     Nose: Nose normal.     Mouth/Throat:     Mouth: Mucous membranes are moist.  Eyes:     Pupils: Pupils are equal, round, and reactive to light.  Neck:     Musculoskeletal: Normal range of motion.  Cardiovascular:     Rate and Rhythm: Normal rate and regular rhythm.     Pulses: Normal pulses.  Pulmonary:     Effort: Pulmonary effort is normal.     Breath sounds: Normal breath sounds.  Abdominal:     General: Abdomen  is flat.  Genitourinary:    Comments: Left labia significantly swollen and tender with fluctuance Musculoskeletal: Normal range of motion.  Neurological:     General: No focal deficit present.     Mental Status: She is alert and oriented to person, place, and time.  Psychiatric:        Mood and Affect: Mood normal.      ED Treatments / Results  Labs (all labs ordered are listed, but only abnormal results are displayed) Labs Reviewed - No data to display  EKG None  Radiology No results found.  Procedures .Marland KitchenIncision and Drainage Date/Time: 04/10/2019 9:38 AM Performed by: Margarita Grizzle, MD Authorized by: Margarita Grizzle, MD   Consent:    Consent obtained:  Verbal   Consent given by:  Patient   Risks discussed:  Bleeding, incomplete drainage and pain Location:    Type:  Abscess   Size:  10 cm   Location:  Anogenital   Anogenital location: left labia. Pre-procedure details:    Skin preparation:  Chloraprep Procedure type:    Complexity:  Simple Procedure details:    Needle aspiration: no     Incision types:  Single straight   Scalpel blade:  11   Wound management:  Probed and deloculated and irrigated with saline   Drainage:  Purulent   Wound treatment:  Wound left open   Packing materials:  None Post-procedure details:    Patient tolerance of procedure:  Tolerated well, no immediate complications   (including critical care time)  Medications Ordered in ED Medications - No data to display   Initial Impression / Assessment and Plan / ED Course  I have reviewed the triage vital signs and the nursing notes.  Pertinent labs & imaging results that were available during my care of the patient were reviewed by me and considered in my medical decision making (see chart for details).       Patient tolerated well Discussed no further shaving Discussed soaking Plan follow-up with GYN   Final Clinical Impressions(s) / ED Diagnoses   Final diagnoses:  Left  genital labial abscess  ED Discharge Orders    None       Holden Maniscalco, DaniellMargarita Grizzlee, MD 04/10/19 [phone]

## 2019-04-10 NOTE — Discharge Instructions (Addendum)
Please use warm soaks frequently Use pain medicine as needed Please follow-up with your OB/GYN

## 2019-05-21 ENCOUNTER — Encounter (HOSPITAL_COMMUNITY): Payer: Self-pay | Admitting: Emergency Medicine

## 2019-05-21 ENCOUNTER — Emergency Department (HOSPITAL_COMMUNITY): Payer: Self-pay

## 2019-05-21 ENCOUNTER — Emergency Department (HOSPITAL_COMMUNITY)
Admission: EM | Admit: 2019-05-21 | Discharge: 2019-05-21 | Disposition: A | Discharge status: Home / Self Care | Payer: Self-pay | Attending: Emergency Medicine | Admitting: Emergency Medicine

## 2019-05-21 DIAGNOSIS — Y939 Activity, unspecified: Secondary | ICD-10-CM | POA: Insufficient documentation

## 2019-05-21 DIAGNOSIS — Y929 Unspecified place or not applicable: Secondary | ICD-10-CM | POA: Insufficient documentation

## 2019-05-21 DIAGNOSIS — N751 Abscess of Bartholin's gland: Secondary | ICD-10-CM | POA: Insufficient documentation

## 2019-05-21 DIAGNOSIS — M87074 Idiopathic aseptic necrosis of right foot: Secondary | ICD-10-CM | POA: Insufficient documentation

## 2019-05-21 DIAGNOSIS — Z79899 Other long term (current) drug therapy: Secondary | ICD-10-CM | POA: Insufficient documentation

## 2019-05-21 DIAGNOSIS — M87076 Idiopathic aseptic necrosis of unspecified foot: Secondary | ICD-10-CM

## 2019-05-21 DIAGNOSIS — Y999 Unspecified external cause status: Secondary | ICD-10-CM | POA: Insufficient documentation

## 2019-05-21 MED ORDER — HYDROCODONE-ACETAMINOPHEN 5-325 MG PO TABS: 2.0000 | ORAL_TABLET | ORAL | 0 refills | Status: DC | PRN

## 2019-05-21 MED ORDER — LIDOCAINE-EPINEPHRINE 2 %-1:100000 IJ SOLN
20.0000 mL | Freq: Once | INTRAMUSCULAR | Status: AC
  Administered 2019-05-21: 20 mL
  Filled 2019-05-21: qty 1
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
## 2019-05-21 NOTE — ED Notes (Signed)
Word cath come out prior to discharge, Dr Zenia Resides aware, instructed pt to follow up tomorrow with GYN

## 2019-05-21 NOTE — ED Notes (Signed)
Pt tolerated I&D with placement of Word cath well, She understands the need to follow up with the referrals he has explained to her. She verbalizes agreeing.

## 2019-05-21 NOTE — ED Notes (Signed)
Ortho has been called to place CAM walker on foot.
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
## 2019-05-23 ENCOUNTER — Emergency Department (HOSPITAL_COMMUNITY)
Admission: EM | Admit: 2019-05-23 | Discharge: 2019-05-23 | Disposition: A | Discharge status: Home / Self Care | Payer: Self-pay | Attending: Emergency Medicine | Admitting: Emergency Medicine

## 2019-05-23 ENCOUNTER — Encounter (HOSPITAL_COMMUNITY): Payer: Self-pay | Admitting: Emergency Medicine

## 2019-05-23 ENCOUNTER — Telehealth (INDEPENDENT_AMBULATORY_CARE_PROVIDER_SITE_OTHER): Payer: Self-pay | Admitting: Obstetrics and Gynecology

## 2019-05-23 ENCOUNTER — Telehealth: Payer: Self-pay | Admitting: Obstetrics and Gynecology

## 2019-05-23 ENCOUNTER — Other Ambulatory Visit: Payer: Self-pay

## 2019-05-23 DIAGNOSIS — N751 Abscess of Bartholin's gland: Secondary | ICD-10-CM | POA: Insufficient documentation

## 2019-05-23 DIAGNOSIS — Z79899 Other long term (current) drug therapy: Secondary | ICD-10-CM | POA: Insufficient documentation

## 2019-05-23 MED ORDER — LIDOCAINE HCL (PF) 1 % IJ SOLN
5.0000 mL | Freq: Once | INTRAMUSCULAR | Status: AC
  Administered 2019-05-23: 5 mL
  Filled 2019-05-23: qty 5

## 2019-05-23 NOTE — Discharge Instructions (Addendum)
Follow-up with OB/GYN in 2 days for reevaluation.  Return to the ED for any new or worsening symptoms.  Warm soaks to areas.

## 2019-05-23 NOTE — ED Triage Notes (Signed)
Pt states she had a cyst drained on Sunday at [facility] long and now it is back and feels worse. Abscess is on labia.

## 2019-05-23 NOTE — ED Provider Notes (Signed)
[facility] Provider Note   CSN: [id number] Arrival date & time: 05/23/19  1911  History   Chief Complaint Chief Complaint  Patient presents with  . Abscess   HPI Carrie Nielsen is a 28 y.o. female with no significant past medical history significant for labial abscess who presents for evaluation of labial abscess.  Patient states she had abscess [facility] Sunday [facility].  Patient states it began to accumulate immediately.  Patient states is larger more painful than it was previously.  Denies fever, chills, nausea, vomiting, abdominal pain, dysuria, diarrhea, constipation.  Has not taken anything for pain.  She rates her current pain a 9/10.  Denies radiation of pain.  Patient denies redness or warmth to her labia.  She has been able to void without difficulty. Has follow up with OBgyn on Friday for reevaluation.  History obtained from patient and past medical records.  No interpreter is used.     HPI  Past Medical History:  Diagnosis Date  . History of cold sores   . Medical history non-contributory     Patient Active Problem List   Diagnosis Date Noted  . Post-dates pregnancy 02/11/2016  . Status post vacuum-assisted vaginal delivery 02/11/2016  . Chlamydia infection 06/16/2014  . 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 Esophageal reflux 02/08/2013    Past Surgical History:  Procedure Laterality Date  . NO PAST SURGERIES    . WISDOM TOOTH EXTRACTION       OB History    Gravida  2   Para  2   Term  2   Preterm      AB      Living  2     SAB      TAB      Ectopic      Multiple  0   Live Births  2            Home Medications    Prior to Admission medications   Medication Sig Start Date End Date Taking? Authorizing Provider  doxycycline (VIBRA-TABS) 100 MG tablet Take 1 tablet (100 mg total) by mouth 2 (two) times daily. Patient not taking: Reported on 05/21/2019 04/08/19    Elvina SidleLauenstein, Kurt, MD  Etonogestrel [facility](NEXPLANON [place]) Inject 1 application into the skin once.    [provider]  fluocinonide-emollient (LIDEX-E) 0.05 % cream Apply 1 application topically 2 (two) times daily. Patient not taking: Reported on 05/21/2019 10/22/16   Brock BadHarper, Charles A, MD  HYDROcodone-acetaminophen (NORCO/VICODIN) 5-325 MG tablet Take 2 tablets by mouth every 4 (four) hours as needed. 05/21/19   Lorre NickAllen, Anthony, MD  ibuprofen (ADVIL,MOTRIN) 600 MG tablet Take 1 tablet (600 mg total) by mouth every 6 (six) hours as needed for fever, headache or moderate pain. Patient not taking: Reported on 05/21/2019 10/24/18   Bethel BornGekas, Kelly Marie, PA-C  naproxen (NAPROSYN) 500 MG tablet Take 1 tablet (500 mg total) by mouth 2 (two) times daily. Patient not taking: Reported on 05/21/2019 03/25/18   Dartha LodgeFord, Kelsey N, PA-C  oxyCODONE-acetaminophen (PERCOCET/ROXICET) 5-325 MG tablet Take 1 tablet by mouth every 4 (four) hours as needed for severe pain. 04/10/19   Margarita Grizzleay, Danielle, MD    Family History Family History  Problem Relation Age of Onset  . Cancer Maternal Grandfather        brain  . Alcohol abuse Neg Hx   . Arthritis Neg Hx   . Asthma Neg Hx   .  Birth defects Neg Hx   . COPD Neg Hx   . Depression Neg Hx   . Diabetes Neg Hx   . Early death Neg Hx   . Drug abuse Neg Hx   . Hearing loss Neg Hx   . Heart disease Neg Hx   . Hyperlipidemia Neg Hx   . Hypertension Neg Hx   . Kidney disease Neg Hx   . Learning disabilities Neg Hx   . Mental illness Neg Hx   . Mental retardation Neg Hx   . Miscarriages / Stillbirths Neg Hx   . Stroke Neg Hx   . Vision loss Neg Hx     Social History Social History   Tobacco Use  . Smoking status: Never Smoker  . Smokeless tobacco: Never Used  Substance Use Topics  . Alcohol use: No    Alcohol/week: 0.0 standard drinks  . Drug use: No     Allergies   Patient has no known allergies.   Review of Systems Review of Systems  Constitutional:  Negative.   HENT: Negative.   Respiratory: Negative.   Cardiovascular: Negative.   Gastrointestinal: Negative.   Genitourinary: Positive for vaginal pain. Negative for decreased urine volume, difficulty urinating, dysuria, flank pain, frequency, genital sores, hematuria, menstrual problem, pelvic pain, urgency, vaginal bleeding and vaginal discharge.  Musculoskeletal: Negative.   Skin: Negative.   Neurological: Negative.   All other systems reviewed and are negative.    Physical Exam Updated Vital Signs BP 120/82 (BP Location: Right Arm)   Pulse 72   Temp 98.3 F (36.8 C) (Oral)   Resp 18   Ht 5\' 10"  (1.778 m)   Wt 65.8 kg   LMP 04/30/2019   SpO2 98%   BMI 20.81 kg/m   Physical Exam Vitals signs and nursing note reviewed. Exam conducted with a chaperone present.  Constitutional:      General: She is not in acute distress.    Appearance: She is well-developed. She is not ill-appearing, toxic-appearing or diaphoretic.  HENT:     Head: Atraumatic.  Eyes:     Pupils: Pupils are equal, round, and reactive to light.  Neck:     Musculoskeletal: Normal range of motion.  Cardiovascular:     Rate and Rhythm: Normal rate.  Pulmonary:     Effort: Pulmonary effort is normal. No respiratory distress.     Breath sounds: Normal breath sounds and air entry.     Comments: Clear to auscultation bilaterally. Abdominal:     General: There is no distension.     Hernia: There is no hernia in the left inguinal area or right inguinal area.     Comments: Soft, nontender without rebound or guarding.  Genitourinary:    Labia:        Right: No rash, tenderness, lesion or injury.        Left: Tenderness present. No rash or injury.        Comments: Large left labial abscess.  Tender to palpation.  No erythema. Musculoskeletal: Normal range of motion.  Lymphadenopathy:     Lower Body: No right inguinal adenopathy. No left inguinal adenopathy.  Skin:    General: Skin is warm and dry.      Capillary Refill: Capillary refill takes less than 2 seconds.     Comments: Area of fluctuance without induration to Bartholin's gland.  No overlying cellulitis.  Previous incision to left labia with minimal purulent drainage.  Neurological:     Mental Status: She  is alert.      ED Treatments / Results  Labs (all labs ordered are listed, but only abnormal results are displayed) Labs Reviewed - No data to display  EKG None  Radiology No results found.  Procedures .Marland Kitchen.Incision and Drainage  Date/Time: 05/23/2019 10:56 PM Performed by: Linwood DibblesHenderly, Azha Constantin A, PA-C Authorized by: Linwood DibblesHenderly, Ayan Yankey A, PA-C   Consent:    Consent obtained:  Verbal   Consent given by:  Patient   Risks discussed:  Bleeding, incomplete drainage, pain and damage to other organs   Alternatives discussed:  No treatment Universal protocol:    Procedure explained and questions answered to patient or proxy's satisfaction: yes     Relevant documents present and verified: yes     Test results available and properly labeled: yes     Imaging studies available: yes     Required blood products, implants, devices, and special equipment available: yes     Site/side marked: yes     Immediately prior to procedure a time out was called: yes     Patient identity confirmed:  Verbally with patient Location:    Type:  Abscess (Bartholin abscess)   Location:  Anogenital   Anogenital location:  Bartholin's gland Pre-procedure details:    Skin preparation:  Betadine Anesthesia (see MAR for exact dosages):    Anesthesia method:  Local infiltration   Local anesthetic:  Lidocaine 1% WITH epi Procedure type:    Complexity:  Complex Procedure details:    Incision types:  Single straight   Incision depth:  Subcutaneous   Scalpel blade:  11   Wound management:  Probed and deloculated, irrigated with saline and extensive cleaning   Drainage:  Purulent   Drainage amount:  Copious   Wound treatment:  Drain placed   Packing  materials:  1/4 in gauze Post-procedure details:    Patient tolerance of procedure:  Tolerated well, no immediate complications   (including critical care time)  Medications Ordered in ED Medications  lidocaine (PF) (XYLOCAINE) 1 % injection 5 mL (5 mLs Infiltration Given 05/23/19 2155)   Initial Impression / Assessment and Plan / ED Course  I have reviewed the triage vital signs and the nursing notes.  Pertinent labs & imaging results that were available during my care of the patient were reviewed by me and considered in my medical decision making (see chart for details).  28 year old female appears otherwise well presents for evaluation of Bartholin's abscess.  She is afebrile, nonseptic, non-ill-appearing.  Had drained 2 days ago in ED however abscess immediately failed.  Originally had Word catheter placed however this fell out.  Patient does not meet Sirs or sepsis criteria.  She is voiding without difficulty.  GU exam with a large left Bartholin's abscess.  Abscess drained with copious purulent drainage.  No overlying cellulitis.  Patient unable to tolerate Word catheter however did place quarter inch packing.  Ambulatory without difficulty.  Abdomen soft, nontender without rebound or guarding.  Patient already has follow-up with OB/GYN in 2 days.  She is without tachycardia, tachypnea or hypoxia.  See procedure note.  Patient to keep follow-up appointment.  Discussed strict return precautions.  Patient voiced understanding and is agreeable for follow-up.      Final Clinical Impressions(s) / ED Diagnoses   Final diagnoses:  Abscess of Bartholin's gland    ED Discharge Orders    None       Grover Woodfield A, PA-C 05/23/19 2301    Gerhard MunchLockwood, Robert, MD 05/29/19  1924  

## 2019-05-23 NOTE — Telephone Encounter (Signed)
The patient called in stating she does not have insurance and was told to call our office to make an appointment. The patient [facility] however due to the insurance she is now schedule with our clinic. She also stated the catheter di not stay in place. She stated the hospital told it may not and follow up with [facility]. She stated its very painful, swollen, and the area is getting worse. Informed the patient of the upcoming appointment and also sending a message to the nurse.

## 2019-05-23 NOTE — Telephone Encounter (Signed)
The patient has an upcoming appointment on Friday however she stated the area where the catheter is located is swelling. She stated she thinks it has something to do with the catheter not staying in. Very painful, would like to speak with a nurse.

## 2019-05-23 NOTE — ED Notes (Signed)
Patient Alert and oriented to baseline. Stable and ambulatory to baseline. Patient verbalized understanding of the discharge instructions.  Patient belongings were taken by the patient.   

## 2019-05-24 NOTE — Telephone Encounter (Signed)
Called and spoke with pt. She went to the ED yesterday and further drainage was performed. Pt to follow up with [facility] on Friday. Pt reports she has no questions/concerns at this time. Pt to call as needed.

## 2019-05-26 ENCOUNTER — Ambulatory Visit (INDEPENDENT_AMBULATORY_CARE_PROVIDER_SITE_OTHER): Payer: Self-pay | Admitting: Obstetrics & Gynecology

## 2019-05-26 ENCOUNTER — Other Ambulatory Visit: Payer: Self-pay

## 2019-05-26 ENCOUNTER — Encounter: Payer: Self-pay | Admitting: Obstetrics & Gynecology

## 2019-05-26 VITALS — BP 123/74 | HR 62 | Temp 98.3°F | Ht 72.0 in | Wt 143.2 lb

## 2019-05-26 DIAGNOSIS — N751 Abscess of Bartholin's gland: Secondary | ICD-10-CM

## 2019-05-26 MED ORDER — IBUPROFEN 600 MG PO TABS: 600.0000 mg | ORAL_TABLET | Freq: Four times a day (QID) | ORAL | 1 refills | Status: DC | PRN

## 2019-05-26 MED ORDER — SULFAMETHOXAZOLE-TRIMETHOPRIM 800-160 MG PO TABS: 1.0000 | ORAL_TABLET | Freq: Two times a day (BID) | ORAL | 0 refills | Status: DC

## 2019-05-26 NOTE — Patient Instructions (Signed)
Bartholin's Cyst  A Bartholin's cyst is a fluid-filled sac that forms on a Bartholin's gland. Bartholin's glands are small glands in the folds of skin around the vaginal opening (labia). These glands produce a fluid to moisten (lubricate) the outside of the vagina during sex. A cyst that is not large or infected may not cause any problems or require treatment. If the cyst gets infected, it is called a Bartholin's abscess. An abscess may cause symptoms such as pain and swelling and is more likely to require treatment. What are the causes? This condition may be caused by a blocked Bartholin's gland. These glands can become blocked due to natural buildup of fluid and oils. Bacteria inside of the cyst can cause infection. In many cases, the cause is not known. What increases the risk? You may be at increased risk of developing a Bartholin's cyst or abscess if:  You are of childbearing age.  You have a history of Bartholin's cysts or abscesses.  You have diabetes.  You have an STI (sexually transmitted infection). What are the signs or symptoms? Symptoms may include:  A bulge or lump on the labia, near the lower opening of the vagina.  Discomfort or pain. This may get worse during sex or when walking.  Redness, swelling, or fluid draining from the area. These may be signs of an abscess. How severe your symptoms are depends on the size of your cyst and whether it is infected. Infection causes symptoms to get more severe. How is this diagnosed? This condition may be diagnosed based on:  Your symptoms and medical history.  A physical exam to check for swelling in your vaginal area. You may lie on your back on an exam table and have your feet placed into footrests for the exam.  Blood tests to check for infections.  Removal of a fluid sample from the cyst or abscess (biopsy) for testing. You may work with a health care provider who specializes in women's health (gynecologist) for diagnosis  and treatment. How is this treated? If your cyst is small, not infected, and not causing symptoms, you may not need any treatment. These cysts often go away on their own, with home care such as hot baths or warm compresses. If you have a large cyst or an abscess, treatment may include:  Antibiotic medicine.  A procedure to drain the fluid inside the cyst or abscess. These procedures involve making an incision in the cyst or abscess so that the fluid drains out, and then one of the following may be done: ? A small, thin tube (catheter) may be placed inside the cyst or abscess so that it does not close and fill up with fluid again (fistulization). The catheter will be removed at a follow-up visit. ? The edges of the incision may be stitched to your skin so that the cyst or abscess stays open (marsupialization). This allows it to continue to drain and not fill up with fluid again. If you have cysts or abscesses that keep returning (recurring) and have required incision and drainage multiple times, your health care provider may talk with you about surgery to remove the Bartholin's gland. Follow these instructions at home: Medicines  Take over-the-counter and prescription medicines only as told by your health care provider.  If you were prescribed an antibiotic medicine, take it as told by your health care provider. Do not stop taking the antibiotic even if your condition improves. Managing pain and swelling  Try sitz baths to help with   pain and swelling. A sitz bath is a warm water bath in which the water only comes up to your hips and should cover your buttocks. You may take sitz baths several times a day.  Apply heat to the affected area as often as needed. Use the heat source that your health care provider recommends, such as a moist heat pack or a heating pad. ? Place a towel between your skin and the heat source. ? Leave the heat on for 20-30 minutes. ? Remove the heat if your skin turns  bright red. This is especially important if you are unable to feel pain, heat, or cold. You may have a greater risk of getting burned. General instructions  If your cyst or abscess was drained, follow instructions from your health care provider about how to take care of your wound. Use feminine pads as needed to absorb any drainage.  Do not push on or squeeze your cyst.  Do not have sex until the cyst has gone away or your wound from drainage has healed.  Take these steps to help prevent a Bartholin's cyst from returning, and to prevent other Bartholin's cysts from developing: ? Take a bath or shower once a day. Clean your vaginal area with mild soap and water when you bathe. ? Practice safe sex to prevent STIs. Talk with your health care provider about how to prevent STIs and which forms of birth control (contraception) may be best for you.  Keep all follow-up visits as told by your health care provider. This is important. Contact a health care provider if:  You have a fever.  You develop redness, swelling, or pain around your cyst.  You have fluid, blood, pus, or a bad smell coming from your cyst.  You have a cyst that gets larger or comes back. Summary  A Bartholin's cyst is a fluid-filled sac that forms on a Bartholin's gland. These glands are in the folds of skin around the vaginal opening (labia).  If your cyst is small, not infected, and not causing symptoms, you may not need any treatment. These cysts often go away on their own, with home care such as hot baths or warm compresses.  If you have a large cyst or an abscess, your health care provider may perform a procedure to drain the fluid.  If you have cysts or abscesses that keep returning (recurring) and have required incision and drainage multiple times, your health care provider may talk with you about surgery to remove the Bartholin's gland. This information is not intended to replace advice given to you by your health  care provider. Make sure you discuss any questions you have with your health care provider. Document Released: 10/26/2005 Document Revised: 08/18/2018 Document Reviewed: 07/28/2017 Elsevier Patient Education  2020 [employer]  

## 2019-05-26 NOTE — Progress Notes (Signed)
Pt reports is having labial pain. She has run out of pain med and requests refill. She requests to be tested for all STI's today. She also states that she has Nexplanon which is due for removal on 7/27. She wants to know how this will be managed because she does not have insurance.

## 2019-05-26 NOTE — Progress Notes (Signed)
Patient ID: Carrie Nielsen, female   DOB: 08/05/1991, 28 y.o.   MRN: [id number]  Chief Complaint  Patient presents with  . Follow-up  after I&D vulvar abscess  HPI Carrie Nielsen is a 28 y.o. female.  [id number] She was seen for several visits in the ED for Bartholins abscess with attempted Word catheter placement which fell out. She has packing in place for 3 days, with pain but improved. HPI  Past Medical History:  Diagnosis Date  . History of cold sores   . Medical history non-contributory     Past Surgical History:  Procedure Laterality Date  . NO PAST SURGERIES    . WISDOM TOOTH EXTRACTION      Family History  Problem Relation Age of Onset  . Cancer Maternal Grandfather        brain  . Alcohol abuse Neg Hx   . Arthritis Neg Hx   . Asthma Neg Hx   . Birth defects Neg Hx   . COPD Neg Hx   . Depression Neg Hx   . Diabetes Neg Hx   . Early death Neg Hx   . Drug abuse Neg Hx   . Hearing loss Neg Hx   . Heart disease Neg Hx   . Hyperlipidemia Neg Hx   . Hypertension Neg Hx   . Kidney disease Neg Hx   . Learning disabilities Neg Hx   . Mental illness Neg Hx   . Mental retardation Neg Hx   . Miscarriages / Stillbirths Neg Hx   . Stroke Neg Hx   . Vision loss Neg Hx     Social History Social History   Tobacco Use  . Smoking status: Never Smoker  . Smokeless tobacco: Never Used  Substance Use Topics  . Alcohol use: No    Alcohol/week: 0.0 standard drinks  . Drug use: No    No Known Allergies  Current Outpatient Medications  Medication Sig Dispense Refill  . Etonogestrel (NEXPLANON [place]) Inject 1 application into the skin once.    Marland Kitchen. HYDROcodone-acetaminophen (NORCO/VICODIN) 5-325 MG tablet Take 2 tablets by mouth every 4 (four) hours as needed. 15 tablet 0  . ibuprofen (ADVIL,MOTRIN) 600 MG tablet Take 1 tablet (600 mg total) by mouth every 6 (six) hours as needed for fever, headache or moderate pain. (Patient not taking: Reported on 05/21/2019) 30 tablet 0  .  naproxen (NAPROSYN) 500 MG tablet Take 1 tablet (500 mg total) by mouth 2 (two) times daily. (Patient not taking: Reported on 05/21/2019) 30 tablet 0  . oxyCODONE-acetaminophen (PERCOCET/ROXICET) 5-325 MG tablet Take 1 tablet by mouth every 4 (four) hours as needed for severe pain. (Patient not taking: Reported on 05/26/2019) 15 tablet 0   No current facility-administered medications for this visit.     Review of Systems Review of Systems  Constitutional: Negative.   Genitourinary: Positive for vaginal pain. Negative for vaginal bleeding and vaginal discharge.    Blood pressure 123/74, pulse 62, temperature 98.3 F (36.8 C), height 6' (1.829 m), weight 143 lb 3.2 oz (65 kg), last menstrual period 04/30/2019, currently breastfeeding.  Physical Exam Physical Exam Vitals signs and nursing note reviewed.  Constitutional:      Appearance: Normal appearance. She is not ill-appearing.  Pulmonary:     Effort: Pulmonary effort is normal.  Genitourinary:    Comments: Left vulvar opening 1 cm at I&D site with gauze pack very superficial and came out easily. 1/2 in gauze pack replaced, tolerated with some pain. No induration  or swelling noted Neurological:     Mental Status: She is alert.     Data Reviewed ED notes  Assessment S/p I&D vulvar abscess  Plan Ibuprofen for pain Bactrim for 5 days RTC 2 weeks   Emeterio Reeve 05/26/2019, 10:48 AM

## 2019-06-15 ENCOUNTER — Ambulatory Visit: Payer: Self-pay | Admitting: Obstetrics & Gynecology

## 2019-06-15 ENCOUNTER — Encounter: Payer: Self-pay | Admitting: Obstetrics & Gynecology

## 2019-07-11 ENCOUNTER — Telehealth: Payer: Self-pay | Admitting: Medical

## 2019-07-11 NOTE — Telephone Encounter (Signed)
Called the patient and left a detailed voicemail of the upcoming appointment. Informed the patient if she has been diagnosed with covid19, in close contact with someone who has had covid19, or experienced any flu-like symptoms such as fever, rash, sore throat, or shortness of breath please call our office to reschedule the appointment. If you have any questions or concerns please give our office a call at [phone]. °

## 2019-07-12 ENCOUNTER — Other Ambulatory Visit: Payer: Self-pay

## 2019-07-12 ENCOUNTER — Encounter: Payer: Self-pay | Admitting: Obstetrics & Gynecology

## 2019-07-12 ENCOUNTER — Ambulatory Visit (INDEPENDENT_AMBULATORY_CARE_PROVIDER_SITE_OTHER): Payer: Self-pay | Admitting: Obstetrics & Gynecology

## 2019-07-12 ENCOUNTER — Ambulatory Visit: Payer: Self-pay | Admitting: Obstetrics & Gynecology

## 2019-07-12 VITALS — BP 116/76 | HR 63 | Temp 97.9°F | Ht 70.0 in | Wt 145.6 lb

## 2019-07-12 DIAGNOSIS — N751 Abscess of Bartholin's gland: Secondary | ICD-10-CM

## 2019-07-12 NOTE — Patient Instructions (Signed)
Bartholin's Cyst  A Bartholin's cyst is a fluid-filled sac that forms on a Bartholin's gland. Bartholin's glands are small glands in the folds of skin around the opening of the vagina (labia). This type of cyst causes a bulge or lump near the lower opening of the vagina. If you have a cyst that is small and not infected, you may be able to take care of it at home. If your cyst gets infected, it may cause pain and your doctor may need to drain it. An infected Bartholin's cyst is called a Bartholin's abscess. Follow these instructions at home: Medicines  Take over-the-counter and prescription medicines only as told by your doctor.  If you were prescribed an antibiotic medicine, take it as told by your doctor. Do not stop taking the antibiotic even if you start to feel better. Managing pain and swelling  Try sitz baths to help with pain and swelling. A sitz bath is a warm water bath in which the water only comes up to your hips and should cover your buttocks. You may take sitz baths a few times a day.  Put heat on the affected area as often as needed. Use the heat source that your doctor recommends, such as a moist heat pack or a heating pad. ? Place a towel between your skin and the heat source. ? Leave the heat on for 20-30 minutes. ? Remove the heat if your skin turns bright red. This is especially important if you cannot feel pain, heat, or cold. You may have a greater risk of getting burned. General instructions  If your cyst or abscess was drained: ? Follow instructions from your doctor about how to take care of your wound. ? Use feminine pads to absorb any fluid.  Do not push on or squeeze your cyst.  Do not have sex until the cyst has gone away or your wound from drainage has healed.  Take these steps to help prevent a Bartholin's cyst from returning, and to prevent other Bartholin's cysts from forming: ? Take a bath or shower once a day. Clean your vaginal area with mild soap and  water when you bathe. ? Practice safe sex to prevent STIs (sexually transmitted infections). Talk with your doctor about how to prevent STIs and which forms of birth control (contraception) may be best for you.  Keep all follow-up visits as told by your doctor. This is important. Contact a doctor if:  You have a fever.  You get redness, swelling, or pain around your cyst.  You have fluid, blood, pus, or a bad smell coming from your cyst.  You have a cyst that gets larger or comes back. Summary  A Bartholin's cyst is a fluid-filled sac that forms on a Bartholin's gland. These small glands are found in the folds of skin around the opening of the vagina (labia).  This type of cyst causes a bulge or lump near the lower opening of the vagina. An infected Bartholin's cyst is called a Bartholin's abscess.  Try sitz baths a few times a day to help with pain and swelling.  Do not push on or squeeze your cyst. This information is not intended to replace advice given to you by your health care provider. Make sure you discuss any questions you have with your health care provider. Document Released: 01/22/2009 Document Revised: 08/18/2018 Document Reviewed: 07/28/2017 Elsevier Patient Education  2020 Elsevier Inc.  

## 2019-07-12 NOTE — Progress Notes (Signed)
  Subjective:     Patient ID: Carrie Nielsen, female   DOB: 02-16-1991, 28 y.o.   MRN: [id number] Cc;f/u after cyst I&D HPI [id number] Patient's last menstrual period was 07/08/2019 (exact date). The pack has fallen out after I&D 05/2019. No pain but mild fullness on left vulva  Review of Systems  Constitutional: Negative.   Respiratory: Negative.   Genitourinary: Negative for vaginal discharge and vaginal pain.       Objective:   Physical Exam Vitals signs and nursing note reviewed.  Constitutional:      Appearance: Normal appearance.  Pulmonary:     Effort: Pulmonary effort is normal.  Neurological:     Mental Status: She is alert.   left vulva at introitus not tender but soft 2 cm deep cystic collection not pointing no induration     Assessment:     Bartholin's cyst asymptomatic now but still present    Plan:     RTC if her sx increase   Woodroe Mode, MD 07/12/2019

## 2020-01-21 ENCOUNTER — Encounter (HOSPITAL_COMMUNITY): Payer: Self-pay

## 2020-01-21 ENCOUNTER — Other Ambulatory Visit: Payer: Self-pay

## 2020-01-21 ENCOUNTER — Emergency Department (HOSPITAL_COMMUNITY)
Admission: EM | Admit: 2020-01-21 | Discharge: 2020-01-22 | Disposition: A | Discharge status: Home / Self Care | Payer: Self-pay | Attending: Emergency Medicine | Admitting: Emergency Medicine

## 2020-01-21 DIAGNOSIS — B002 Herpesviral gingivostomatitis and pharyngotonsillitis: Secondary | ICD-10-CM | POA: Insufficient documentation

## 2020-01-21 DIAGNOSIS — Z79899 Other long term (current) drug therapy: Secondary | ICD-10-CM | POA: Insufficient documentation

## 2020-01-21 NOTE — ED Triage Notes (Signed)
Pt arrives to ED w/ c/o cold sore breakout. Pt denies pain at this time.

## 2020-01-22 MED ORDER — VALACYCLOVIR HCL 1 G PO TABS: 2000.0000 mg | ORAL_TABLET | Freq: Two times a day (BID) | ORAL | 0 refills | Status: AC

## 2020-01-22 MED ORDER — ACYCLOVIR 5 % EX OINT: 1.0000 "application " | TOPICAL_OINTMENT | CUTANEOUS | 0 refills | Status: AC

## 2020-01-22 NOTE — ED Notes (Signed)
Pt verbalized understanding of d/c instructions, follow up care, s/s requiring return to ED and prescriptions. Pt had no further questions at this time.  

## 2020-01-22 NOTE — ED Provider Notes (Signed)
[facility] EMERGENCY DEPARTMENT Provider Note   CSN: [id number] Arrival date & time: 01/21/20  2326     History Chief Complaint  Patient presents with  . Mouth Lesions    Carrie Nielsen is a 29 y.o. female with a hx of oral herpes simplex presents to the Emergency Department complaining of gradual, persistent, progressively worsening herpes simplex outbreak on the left lower lip onset 2 days ago.  Patient reports she has previously taken valacyclovir for this however she does not currently have a primary care doctor and did not have any refills left.  No treatments prior to arrival.  No aggravating or alleviating factors.  Patient reports symptoms have worsened in the last 24 hours.  The history is provided by the patient and medical records. No language interpreter was used.       Past Medical History:  Diagnosis Date  . History of cold sores   . Medical history non-contributory     Patient Active Problem List   Diagnosis Date Noted  . 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 Esophageal reflux 02/08/2013    Past Surgical History:  Procedure Laterality Date  . NO PAST SURGERIES    . WISDOM TOOTH EXTRACTION       OB History    Gravida  2   Para  2   Term  2   Preterm      AB      Living  2     SAB      TAB      Ectopic      Multiple  0   Live Births  2           Family History  Problem Relation Age of Onset  . Cancer Maternal Grandfather        brain  . Alcohol abuse Neg Hx   . Arthritis Neg Hx   . Asthma Neg Hx   . Birth defects Neg Hx   . COPD Neg Hx   . Depression Neg Hx   . Diabetes Neg Hx   . Early death Neg Hx   . Drug abuse Neg Hx   . Hearing loss Neg Hx   . Heart disease Neg Hx   . Hyperlipidemia Neg Hx   . Hypertension Neg Hx   . Kidney disease Neg Hx   . Learning disabilities Neg Hx   . Mental illness Neg Hx   . Mental retardation Neg Hx   . Miscarriages / Stillbirths Neg Hx   . Stroke  Neg Hx   . Vision loss Neg Hx     Social History   Tobacco Use  . Smoking status: Never Smoker  . Smokeless tobacco: Never Used  Substance Use Topics  . Alcohol use: No    Alcohol/week: 0.0 standard drinks  . Drug use: No    Home Medications Prior to Admission medications   Medication Sig Start Date End Date Taking? Authorizing Provider  acyclovir ointment (ZOVIRAX) 5 % Apply 1 application topically every 3 (three) hours for 3 days. 01/22/20 01/25/20  Lotus Gover, Jarrett Soho, PA-C  Etonogestrel (NEXPLANON [place]) Inject 1 application into the skin once.    [provider]  HYDROcodone-acetaminophen (NORCO/VICODIN) 5-325 MG tablet Take 2 tablets by mouth every 4 (four) hours as needed. Patient not taking: Reported on 07/12/2019 05/21/19   Lacretia Leigh, MD  ibuprofen (ADVIL) 600 MG tablet Take 1 tablet (600 mg total) by mouth every 6 (six) hours as  needed. 05/26/19   Adam Phenix, MD  naproxen (NAPROSYN) 500 MG tablet Take 1 tablet (500 mg total) by mouth 2 (two) times daily. Patient not taking: Reported on 05/21/2019 03/25/18   Dartha Lodge, PA-C  oxyCODONE-acetaminophen (PERCOCET/ROXICET) 5-325 MG tablet Take 1 tablet by mouth every 4 (four) hours as needed for severe pain. Patient not taking: Reported on 05/26/2019 04/10/19   Margarita Grizzle, MD  sulfamethoxazole-trimethoprim (BACTRIM DS) 800-160 MG tablet Take 1 tablet by mouth 2 (two) times daily. Patient not taking: Reported on 07/12/2019 05/26/19   Adam Phenix, MD  valACYclovir (VALTREX) 1000 MG tablet Take 2 tablets (2,000 mg total) by mouth 2 (two) times daily for 1 day. 01/22/20 01/23/20  Andres Escandon, Dahlia Client, PA-C    Allergies    Patient has no known allergies.  Review of Systems   Review of Systems  Constitutional: Negative for fever.  HENT: Positive for mouth sores.   Musculoskeletal: Negative for neck pain and neck stiffness.  Neurological: Negative for headaches.    Physical Exam Updated Vital Signs BP 111/68 (BP  Location: Right Arm)   Pulse 62   Temp 98.5 F (36.9 C) (Oral)   Resp 18   SpO2 100%   Physical Exam Vitals and nursing note reviewed.  Constitutional:      General: She is not in acute distress.    Appearance: She is well-developed.  HENT:     Head: Normocephalic.     Mouth/Throat:      Comments: No mucosal lesions Eyes:     General: No scleral icterus.    Conjunctiva/sclera: Conjunctivae normal.  Cardiovascular:     Rate and Rhythm: Normal rate.  Pulmonary:     Effort: Pulmonary effort is normal.  Musculoskeletal:        General: Normal range of motion.     Cervical back: Normal range of motion.  Skin:    General: Skin is warm and dry.  Neurological:     Mental Status: She is alert.     ED Results / Procedures / Treatments    Procedures Procedures (including critical care time)  Medications Ordered in ED Medications - No data to display  ED Course  I have reviewed the triage vital signs and the nursing notes.  Pertinent labs & imaging results that were available during my care of the patient were reviewed by me and considered in my medical decision making (see chart for details).    MDM Rules/Calculators/A&P                      Patient presents with flare of her herpes simplex.  No signs of secondary infection.  Has previously taken Valtrex.  Will give valacyclovir oral and acyclovir topical.  Referred to primary care for further management.  Discussed reasons to return to emergency department and patient states understanding.   Final Clinical Impression(s) / ED Diagnoses Final diagnoses:  Oral herpes simplex infection    Rx / DC Orders ED Discharge Orders         Ordered    valACYclovir (VALTREX) 1000 MG tablet  2 times daily     01/22/20 0202    acyclovir ointment (ZOVIRAX) 5 %  Every  3 hours     01/22/20 0202           Sharyn Brilliant, Boyd Kerbs 01/22/20 0205    Cardama, Amadeo Garnet, MD 01/22/20 [phone]

## 2020-01-22 NOTE — Discharge Instructions (Addendum)
1. Medications: Valacyclovir oral, acyclovir topical, usual home medications 2. Treatment: rest, drink plenty of fluids, 3. Follow Up: Please followup with your primary doctor in 3-5 days for discussion of your diagnoses and further evaluation after today's visit; if you do not have a primary care doctor use the resource guide provided to find one; Please return to the ER for new or worsening symptoms or signs of infection

## 2020-02-27 ENCOUNTER — Ambulatory Visit (INDEPENDENT_AMBULATORY_CARE_PROVIDER_SITE_OTHER): Payer: Self-pay | Admitting: Obstetrics

## 2020-02-27 ENCOUNTER — Other Ambulatory Visit: Payer: Self-pay

## 2020-02-27 ENCOUNTER — Encounter: Payer: Self-pay | Admitting: Obstetrics

## 2020-02-27 VITALS — BP 108/70 | HR 53 | Ht 72.0 in | Wt 147.3 lb

## 2020-02-27 DIAGNOSIS — Z3046 Encounter for surveillance of implantable subdermal contraceptive: Secondary | ICD-10-CM

## 2020-02-27 DIAGNOSIS — Z3009 Encounter for other general counseling and advice on contraception: Secondary | ICD-10-CM

## 2020-02-27 NOTE — Progress Notes (Signed)
NEXPLANON REMOVAL NOTE  Date of LMP:   unknown  Contraception used: *Nexplanon   Indications:  The patient desires removal of Nexplanon.  She understands risks, benefits, and alternatives to Implanon and would like to proceed.  Anesthesia:   Lidocaine 1% plain.  Procedure:  A time-out was performed confirming the procedure and the patient's allergy status.  Complications: None                      The rod was palpated and the area was sterilely prepped.  The area beneath the distal tip was anesthetized with 1% xylocaine and the skin incised                       Over the tip and the tip was exposed, grasped with forcep and removed intact.   Steri strip                       And a bandage applied and the arm was wrapped with gauze bandage.  The patient tolerated well.  Instructions:  The patient was instructed to remove the dressing in 24 hours and that some bruising is to be expected.  She was advised to use over the counter analgesics as needed for any pain at the site.  She is to keep the area dry for 24 hours and to call if her hand or arm becomes cold, numb, or blue.  Return visit:  Return in 3 months for Annual / Pap   Brock Bad, MD 02/27/2020 11:49 AM

## 2020-02-27 NOTE — Progress Notes (Signed)
Pt is in the office for nexplanon removal, inserted 06-04-17.   Pt is considering depo injections.

## 2020-04-08 ENCOUNTER — Other Ambulatory Visit: Payer: Self-pay

## 2020-04-08 ENCOUNTER — Encounter (HOSPITAL_COMMUNITY): Payer: Self-pay | Admitting: Emergency Medicine

## 2020-04-08 ENCOUNTER — Emergency Department (HOSPITAL_COMMUNITY)
Admission: EM | Admit: 2020-04-08 | Discharge: 2020-04-08 | Disposition: A | Discharge status: Home / Self Care | Payer: Self-pay | Attending: Emergency Medicine | Admitting: Emergency Medicine

## 2020-04-08 ENCOUNTER — Emergency Department (HOSPITAL_COMMUNITY): Payer: Self-pay

## 2020-04-08 DIAGNOSIS — O468X1 Other antepartum hemorrhage, first trimester: Secondary | ICD-10-CM | POA: Insufficient documentation

## 2020-04-08 DIAGNOSIS — O418X11 Other specified disorders of amniotic fluid and membranes, first trimester, fetus 1: Secondary | ICD-10-CM | POA: Insufficient documentation

## 2020-04-08 DIAGNOSIS — Z3A01 Less than 8 weeks gestation of pregnancy: Secondary | ICD-10-CM | POA: Insufficient documentation

## 2020-04-08 DIAGNOSIS — Z3490 Encounter for supervision of normal pregnancy, unspecified, unspecified trimester: Secondary | ICD-10-CM

## 2020-04-08 DIAGNOSIS — Z79899 Other long term (current) drug therapy: Secondary | ICD-10-CM | POA: Insufficient documentation

## 2020-04-08 DIAGNOSIS — O418X10 Other specified disorders of amniotic fluid and membranes, first trimester, not applicable or unspecified: Secondary | ICD-10-CM

## 2020-04-08 LAB — COMPREHENSIVE METABOLIC PANEL
ALT: 14 U/L (ref 0–44)
AST: 16 U/L (ref 15–41)
Albumin: 3.8 g/dL (ref 3.5–5.0)
Alkaline Phosphatase: 46 U/L (ref 38–126)
Anion gap: 7 (ref 5–15)
BUN: 20 mg/dL (ref 6–20)
CO2: 24 mmol/L (ref 22–32)
Calcium: 9.2 mg/dL (ref 8.9–10.3)
Chloride: 104 mmol/L (ref 98–111)
Creatinine, Ser: 0.81 mg/dL (ref 0.44–1.00)
GFR calc Af Amer: >60 mL/min (ref >60)
GFR calc non Af Amer: >60 mL/min (ref >60)
Glucose, Bld: 83 mg/dL (ref 70–99)
Potassium: 3.8 mmol/L (ref 3.5–5.1)
Sodium: 135 mmol/L (ref 135–145)
Total Bilirubin: 0.5 mg/dL (ref 0.3–1.2)
Total Protein: 7.2 g/dL (ref 6.5–8.1)

## 2020-04-08 LAB — CBC
HCT: 39 % (ref 36.0–46.0)
Hemoglobin: 13.2 g/dL (ref 12.0–15.0)
MCH: 31.4 pg (ref 26.0–34.0)
MCHC: 33.8 g/dL (ref 30.0–36.0)
MCV: 92.6 fL (ref 80.0–100.0)
Platelets: 249 10*3/uL (ref 150–400)
RBC: 4.21 MIL/uL (ref 3.87–5.11)
RDW: 12.3 % (ref 11.5–15.5)
WBC: 7.6 10*3/uL (ref 4.0–10.5)
nRBC: 0 % (ref 0.0–0.2)

## 2020-04-08 LAB — I-STAT BETA HCG BLOOD, ED (MC, WL, AP ONLY): I-stat hCG, quantitative: >2000 m[IU]/mL — ABNORMAL HIGH (ref <5)

## 2020-04-08 LAB — URINALYSIS, ROUTINE W REFLEX MICROSCOPIC
Bilirubin Urine: NEGATIVE
Glucose, UA: NEGATIVE mg/dL
Hgb urine dipstick: NEGATIVE
Ketones, ur: NEGATIVE mg/dL
Leukocytes,Ua: NEGATIVE
Nitrite: NEGATIVE
Protein, ur: NEGATIVE mg/dL
Specific Gravity, Urine: 1.023 (ref 1.005–1.030)
pH: 7 (ref 5.0–8.0)

## 2020-04-08 LAB — WET PREP, GENITAL
Clue Cells Wet Prep HPF POC: NONE SEEN
Sperm: NONE SEEN
Trich, Wet Prep: NONE SEEN
WBC, Wet Prep HPF POC: NONE SEEN
Yeast Wet Prep HPF POC: NONE SEEN

## 2020-04-08 LAB — LIPASE, BLOOD: Lipase: 24 U/L (ref 11–51)

## 2020-04-08 MED ORDER — SODIUM CHLORIDE 0.9% FLUSH: 3.0000 mL | Freq: Once | INTRAVENOUS | Status: DC

## 2020-04-08 NOTE — Discharge Instructions (Addendum)
Follow-up with an OB/GYN doctor.  Your ultrasound showed a single live intrauterine pregnancy at 6 weeks 3 days.  Your due date based on the ultrasound is November 30, 2020

## 2020-04-08 NOTE — ED Notes (Signed)
Pt provided labeled specimen cup for urine collection. ENMiles 

## 2020-04-08 NOTE — ED Provider Notes (Signed)
[facility]-EMERGENCY DEPT Provider Note   CSN: [id number] Arrival date & time: 04/08/20  1626     History Chief Complaint  Patient presents with  . Abdominal Pain  . Nausea    Carrie Nielsen is a 29 y.o. female.  HPI   LMP 1 month ago. Pt states she started having pain in the lower abdomen a few days ago.  It is on both sides.  No vag bleeding or discharge.  No vomiting.  SOme loose stools.  No dysuria.   No prior abd surgeries.  Past Medical History:  Diagnosis Date  . History of cold sores   . Medical history non-contributory     Patient Active Problem List   Diagnosis Date Noted  . 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 Esophageal reflux 02/08/2013    Past Surgical History:  Procedure Laterality Date  . NO PAST SURGERIES    . WISDOM TOOTH EXTRACTION       OB History    Gravida  2   Para  2   Term  2   Preterm      AB      Living  2     SAB      TAB      Ectopic      Multiple  0   Live Births  2           Family History  Problem Relation Age of Onset  . Cancer Maternal Grandfather        brain  . Alcohol abuse Neg Hx   . Arthritis Neg Hx   . Asthma Neg Hx   . Birth defects Neg Hx   . COPD Neg Hx   . Depression Neg Hx   . Diabetes Neg Hx   . Early death Neg Hx   . Drug abuse Neg Hx   . Hearing loss Neg Hx   . Heart disease Neg Hx   . Hyperlipidemia Neg Hx   . Hypertension Neg Hx   . Kidney disease Neg Hx   . Learning disabilities Neg Hx   . Mental illness Neg Hx   . Mental retardation Neg Hx   . Miscarriages / Stillbirths Neg Hx   . Stroke Neg Hx   . Vision loss Neg Hx     Social History   Tobacco Use  . Smoking status: Never Smoker  . Smokeless tobacco: Never Used  Substance Use Topics  . Alcohol use: No    Alcohol/week: 0.0 standard drinks  . Drug use: No    Home Medications Prior to Admission medications   Medication Sig Start Date End Date Taking? Authorizing Provider    Etonogestrel (NEXPLANON [place]) Inject 1 application into the skin once.    [provider]  HYDROcodone-acetaminophen (NORCO/VICODIN) 5-325 MG tablet Take 2 tablets by mouth every 4 (four) hours as needed. Patient not taking: Reported on 07/12/2019 05/21/19   Lorre Nick, MD  ibuprofen (ADVIL) 600 MG tablet Take 1 tablet (600 mg total) by mouth every 6 (six) hours as needed. Patient not taking: Reported on 02/27/2020 05/26/19   Adam Phenix, MD  naproxen (NAPROSYN) 500 MG tablet Take 1 tablet (500 mg total) by mouth 2 (two) times daily. Patient not taking: Reported on 05/21/2019 03/25/18   Dartha Lodge, PA-C  oxyCODONE-acetaminophen (PERCOCET/ROXICET) 5-325 MG tablet Take 1 tablet by mouth every 4 (four) hours as needed for severe pain. Patient not taking: Reported on 05/26/2019  04/10/19   Margarita Grizzle, MD  sulfamethoxazole-trimethoprim (BACTRIM DS) 800-160 MG tablet Take 1 tablet by mouth 2 (two) times daily. Patient not taking: Reported on 07/12/2019 05/26/19   Adam Phenix, MD    Allergies    Patient has no known allergies.  Review of Systems   Review of Systems  Neurological: Positive for headaches.  All other systems reviewed and are negative.   Physical Exam Updated Vital Signs BP 120/79   Pulse 83   Temp 98.9 F (37.2 C) (Oral)   Resp 18   SpO2 100%   Physical Exam Vitals and nursing note reviewed.  Constitutional:      General: She is not in acute distress.    Appearance: She is well-developed.  HENT:     Head: Normocephalic and atraumatic.     Right Ear: External ear normal.     Left Ear: External ear normal.  Eyes:     General: No scleral icterus.       Right eye: No discharge.        Left eye: No discharge.     Conjunctiva/sclera: Conjunctivae normal.  Neck:     Trachea: No tracheal deviation.  Cardiovascular:     Rate and Rhythm: Normal rate and regular rhythm.  Pulmonary:     Effort: Pulmonary effort is normal. No respiratory distress.     Breath  sounds: Normal breath sounds. No stridor. No wheezing or rales.  Abdominal:     General: Bowel sounds are normal. There is no distension.     Palpations: Abdomen is soft.     Tenderness: There is abdominal tenderness in the suprapubic area. There is no guarding or rebound.  Musculoskeletal:        General: No tenderness.     Cervical back: Neck supple.  Skin:    General: Skin is warm and dry.     Findings: No rash.  Neurological:     Mental Status: She is alert.     Cranial Nerves: No cranial nerve deficit (no facial droop, extraocular movements intact, no slurred speech).     Sensory: No sensory deficit.     Motor: No abnormal muscle tone or seizure activity.     Coordination: Coordination normal.     ED Results / Procedures / Treatments   Labs (all labs ordered are listed, but only abnormal results are displayed) Labs Reviewed  URINALYSIS, ROUTINE W REFLEX MICROSCOPIC - Abnormal; Notable for the following components:      Result Value   APPearance HAZY (*)    All other components within normal limits  I-STAT BETA HCG BLOOD, ED ([facility], [facility], [facility] ONLY) - Abnormal; Notable for the following components:   I-stat hCG, quantitative >2,000.0 (*)    All other components within normal limits  WET PREP, GENITAL  LIPASE, BLOOD  COMPREHENSIVE METABOLIC PANEL  CBC  RPR  HIV ANTIBODY (ROUTINE TESTING W REFLEX)  GC/CHLAMYDIA PROBE AMP ([place]) NOT AT [facility]    EKG None  Radiology US OB Comp < 14 Wks  Result Date: 04/08/2020 CLINICAL DATA:  29 year old pregnant female with abdominal pain. LMP: 03/04/2020 corresponding to an estimated gestational age of [redacted] weeks, 0 days. EXAM: OBSTETRIC <14 WK [place] AND TRANSVAGINAL OB US TECHNIQUE: Both transabdominal and transvaginal ultrasound examinations were performed for complete evaluation of the gestation as well as the maternal uterus, adnexal regions, and pelvic cul-de-sac. Transvaginal technique was performed to assess early pregnancy. COMPARISON:   None. FINDINGS: Intrauterine gestational sac: Single  intrauterine gestational sac. Yolk sac:  Seen Embryo:  Present Cardiac Activity: Detected Heart Rate: 125 bpm CRL: 6 mm   6 w   3 d                  [place] EDC: 11/29/2020 Subchorionic hemorrhage: Probable small subchorionic hemorrhage measuring up to 5 mm. Maternal uterus/adnexae: The ovaries are unremarkable. There is a corpus luteum in the right ovary. A dominant cyst is seen in the left ovary. The uterus is slightly heterogeneous. Slight irregularity of the anterior myometrium may represent focal adenomyosis. No significant free fluid in the pelvis. IMPRESSION: Single live intrauterine pregnancy with an estimated gestational age of [redacted] weeks, 3 days. Electronically Signed   By: Anner Crete M.D.   On: 04/08/2020 19:47   US OB Transvaginal  Result Date: 04/08/2020 CLINICAL DATA:  29 year old pregnant female with abdominal pain. LMP: 03/04/2020 corresponding to an estimated gestational age of [redacted] weeks, 0 days. EXAM: OBSTETRIC <14 WK [place] AND TRANSVAGINAL OB US TECHNIQUE: Both transabdominal and transvaginal ultrasound examinations were performed for complete evaluation of the gestation as well as the maternal uterus, adnexal regions, and pelvic cul-de-sac. Transvaginal technique was performed to assess early pregnancy. COMPARISON:  None. FINDINGS: Intrauterine gestational sac: Single intrauterine gestational sac. Yolk sac:  Seen Embryo:  Present Cardiac Activity: Detected Heart Rate: 125 bpm CRL: 6 mm   6 w   3 d                  [place] EDC: 11/29/2020 Subchorionic hemorrhage: Probable small subchorionic hemorrhage measuring up to 5 mm. Maternal uterus/adnexae: The ovaries are unremarkable. There is a corpus luteum in the right ovary. A dominant cyst is seen in the left ovary. The uterus is slightly heterogeneous. Slight irregularity of the anterior myometrium may represent focal adenomyosis. No significant free fluid in the pelvis. IMPRESSION: Single live  intrauterine pregnancy with an estimated gestational age of [redacted] weeks, 3 days. Electronically Signed   By: Anner Crete M.D.   On: 04/08/2020 19:47    Procedures Procedures (including critical care time)  Medications Ordered in ED Medications  sodium chloride flush (NS) 0.9 % injection 3 mL (has no administration in time range)    ED Course  I have reviewed the triage vital signs and the nursing notes.  Pertinent labs & imaging results that were available during my care of the patient were reviewed by me and considered in my medical decision making (see chart for details).  Clinical Course as of Apr 08 2114  Mon Apr 08, 2020  2112 Labs reviewed.  No significant abnormalities.  Positive pregnancy test noted   [JK]  2112 US findings reviewed.  Patient has an intrauterine pregnancy.  Small subchorionic hemorrhage.   [JK]    Clinical Course User Index [JK] Dorie Rank, MD   MDM Rules/Calculators/A&P                      Patient presents with lower abdominal pain.  Her abdominal exam is benign.  No findings to suggest appendicitis.  Urinalysis does not suggest infection.  Patient was not aware that she was pregnant.  Pelvic exam is reassuring.  Ultrasound does not show any evidence of ectopic pregnancy.  She has a single live intrauterine pregnancy.  Small subchorionic hemorrhage likely related to her pain and discomfort.  Patient is stable for discharge.  Discussed outpatient follow-up with OB/GYN. Final Clinical Impression(s) / ED Diagnoses Final diagnoses:  Intrauterine pregnancy  Subchorionic hemorrhage of placenta in first trimester, single or unspecified fetus    Rx / DC Orders ED Discharge Orders    None       Linwood Dibbles, MD 04/08/20 2116

## 2020-04-08 NOTE — ED Triage Notes (Signed)
Patient here from home reporting abd pain and possible pregnancy . Reports last period 1 month ago.

## 2020-04-08 NOTE — ED Notes (Signed)
US at bedside

## 2020-04-09 LAB — GC/CHLAMYDIA PROBE AMP (~~LOC~~) NOT AT ARMC
Chlamydia: NEGATIVE
Comment: NEGATIVE
Comment: NORMAL
Neisseria Gonorrhea: NEGATIVE

## 2020-04-09 LAB — HIV ANTIBODY (ROUTINE TESTING W REFLEX): HIV Screen 4th Generation wRfx: NONREACTIVE

## 2020-04-09 LAB — RPR: RPR Ser Ql: NONREACTIVE

## 2020-04-16 ENCOUNTER — Telehealth (INDEPENDENT_AMBULATORY_CARE_PROVIDER_SITE_OTHER): Payer: Self-pay

## 2020-04-16 DIAGNOSIS — O26851 Spotting complicating pregnancy, first trimester: Secondary | ICD-10-CM

## 2020-04-16 NOTE — Telephone Encounter (Signed)
Pt called front office and reports severe cramping following confirmed IUP. Call transferred to clinical staff. Pt reports she has had some spotting since ED visit on 04/08/20. [place] on that day showed single, live IUP with GA of 6w 3d. Now having severe abdominal cramps (rates pain 6/10). Pt has not tried anything for the pain. Reviewed with Judeth Horn, NP who recommends pt try Tylenol and warm bath for the pain. If pain does not improve, advised patient to go to [facility] for further evaluation. Pt will follow up with our office at new OB intake on 05/01/20.

## 2020-04-26 ENCOUNTER — Inpatient Hospital Stay (HOSPITAL_COMMUNITY)
Admission: AD | Admit: 2020-04-26 | Discharge: 2020-04-26 | Disposition: A | Discharge status: Home / Self Care | Payer: Self-pay | Source: Ambulatory Visit | Attending: Family Medicine | Admitting: Family Medicine

## 2020-04-26 ENCOUNTER — Encounter (HOSPITAL_COMMUNITY): Payer: Self-pay | Admitting: Family Medicine

## 2020-04-26 ENCOUNTER — Other Ambulatory Visit: Payer: Self-pay

## 2020-04-26 DIAGNOSIS — O26899 Other specified pregnancy related conditions, unspecified trimester: Secondary | ICD-10-CM

## 2020-04-26 DIAGNOSIS — O99281 Endocrine, nutritional and metabolic diseases complicating pregnancy, first trimester: Secondary | ICD-10-CM | POA: Insufficient documentation

## 2020-04-26 DIAGNOSIS — O211 Hyperemesis gravidarum with metabolic disturbance: Secondary | ICD-10-CM

## 2020-04-26 DIAGNOSIS — O99891 Other specified diseases and conditions complicating pregnancy: Secondary | ICD-10-CM | POA: Insufficient documentation

## 2020-04-26 DIAGNOSIS — M549 Dorsalgia, unspecified: Secondary | ICD-10-CM | POA: Insufficient documentation

## 2020-04-26 DIAGNOSIS — R61 Generalized hyperhidrosis: Secondary | ICD-10-CM | POA: Insufficient documentation

## 2020-04-26 DIAGNOSIS — O418X90 Other specified disorders of amniotic fluid and membranes, unspecified trimester, not applicable or unspecified: Secondary | ICD-10-CM | POA: Diagnosis not present

## 2020-04-26 DIAGNOSIS — O219 Vomiting of pregnancy, unspecified: Secondary | ICD-10-CM | POA: Insufficient documentation

## 2020-04-26 DIAGNOSIS — Z3A09 9 weeks gestation of pregnancy: Secondary | ICD-10-CM | POA: Insufficient documentation

## 2020-04-26 DIAGNOSIS — N750 Cyst of Bartholin's gland: Secondary | ICD-10-CM | POA: Insufficient documentation

## 2020-04-26 DIAGNOSIS — O468X9 Other antepartum hemorrhage, unspecified trimester: Secondary | ICD-10-CM | POA: Diagnosis not present

## 2020-04-26 DIAGNOSIS — O468X1 Other antepartum hemorrhage, first trimester: Secondary | ICD-10-CM

## 2020-04-26 DIAGNOSIS — R109 Unspecified abdominal pain: Secondary | ICD-10-CM

## 2020-04-26 DIAGNOSIS — E860 Dehydration: Secondary | ICD-10-CM | POA: Insufficient documentation

## 2020-04-26 DIAGNOSIS — R102 Pelvic and perineal pain: Secondary | ICD-10-CM | POA: Insufficient documentation

## 2020-04-26 DIAGNOSIS — O26891 Other specified pregnancy related conditions, first trimester: Secondary | ICD-10-CM | POA: Insufficient documentation

## 2020-04-26 LAB — CBC WITH DIFFERENTIAL/PLATELET
Abs Immature Granulocytes: 0.04 10*3/uL (ref 0.00–0.07)
Basophils Absolute: 0 10*3/uL (ref 0.0–0.1)
Basophils Relative: 0 %
Eosinophils Absolute: 0.1 10*3/uL (ref 0.0–0.5)
Eosinophils Relative: 1 %
HCT: 40.2 % (ref 36.0–46.0)
Hemoglobin: 13.9 g/dL (ref 12.0–15.0)
Immature Granulocytes: 0 %
Lymphocytes Relative: 19 %
Lymphs Abs: 1.9 10*3/uL (ref 0.7–4.0)
MCH: 31.2 pg (ref 26.0–34.0)
MCHC: 34.6 g/dL (ref 30.0–36.0)
MCV: 90.3 fL (ref 80.0–100.0)
Monocytes Absolute: 0.5 10*3/uL (ref 0.1–1.0)
Monocytes Relative: 5 %
Neutro Abs: 7.3 10*3/uL (ref 1.7–7.7)
Neutrophils Relative %: 75 %
Platelets: 273 10*3/uL (ref 150–400)
RBC: 4.45 MIL/uL (ref 3.87–5.11)
RDW: 12.1 % (ref 11.5–15.5)
WBC: 9.8 10*3/uL (ref 4.0–10.5)
nRBC: 0 % (ref 0.0–0.2)

## 2020-04-26 LAB — COMPREHENSIVE METABOLIC PANEL
ALT: 37 U/L (ref 0–44)
AST: 25 U/L (ref 15–41)
Albumin: 3.5 g/dL (ref 3.5–5.0)
Alkaline Phosphatase: 42 U/L (ref 38–126)
Anion gap: 6 (ref 5–15)
BUN: 10 mg/dL (ref 6–20)
CO2: 26 mmol/L (ref 22–32)
Calcium: 9.2 mg/dL (ref 8.9–10.3)
Chloride: 102 mmol/L (ref 98–111)
Creatinine, Ser: 0.62 mg/dL (ref 0.44–1.00)
GFR calc Af Amer: >60 mL/min (ref >60)
GFR calc non Af Amer: >60 mL/min (ref >60)
Glucose, Bld: 101 mg/dL — ABNORMAL HIGH (ref 70–99)
Potassium: 3.4 mmol/L — ABNORMAL LOW (ref 3.5–5.1)
Sodium: 134 mmol/L — ABNORMAL LOW (ref 135–145)
Total Bilirubin: 0.6 mg/dL (ref 0.3–1.2)
Total Protein: 6.7 g/dL (ref 6.5–8.1)

## 2020-04-26 LAB — URINALYSIS, ROUTINE W REFLEX MICROSCOPIC
Bilirubin Urine: NEGATIVE
Glucose, UA: NEGATIVE mg/dL
Hgb urine dipstick: NEGATIVE
Ketones, ur: NEGATIVE mg/dL
Nitrite: NEGATIVE
Protein, ur: NEGATIVE mg/dL
Specific Gravity, Urine: 1.027 (ref 1.005–1.030)
pH: 5 (ref 5.0–8.0)

## 2020-04-26 LAB — LIPASE, BLOOD: Lipase: 20 U/L (ref 11–51)

## 2020-04-26 LAB — AMYLASE: Amylase: 125 U/L — ABNORMAL HIGH (ref 28–100)

## 2020-04-26 MED ORDER — LACTATED RINGERS IV SOLN: Freq: Once | INTRAVENOUS | Status: AC

## 2020-04-26 MED ORDER — SIMETHICONE 80 MG PO CHEW: 80.0000 mg | CHEWABLE_TABLET | Freq: Four times a day (QID) | ORAL | 0 refills | Status: DC | PRN

## 2020-04-26 MED ORDER — PROMETHAZINE HCL 25 MG PO TABS
25.0000 mg | ORAL_TABLET | Freq: Four times a day (QID) | ORAL | 2 refills | Status: DC | PRN
Start: 2020-04-26 — End: 2020-12-06

## 2020-04-26 MED ORDER — HYOSCYAMINE SULFATE 0.125 MG SL SUBL
0.1250 mg | SUBLINGUAL_TABLET | Freq: Once | SUBLINGUAL | Status: AC
  Administered 2020-04-26: 0.125 mg via SUBLINGUAL
  Filled 2020-04-26: qty 1

## 2020-04-26 MED ORDER — SIMETHICONE 80 MG PO CHEW
80.0000 mg | CHEWABLE_TABLET | Freq: Four times a day (QID) | ORAL | Status: DC | PRN
  Administered 2020-04-26: 80 mg via ORAL
  Filled 2020-04-26: qty 1

## 2020-04-26 NOTE — MAU Provider Note (Signed)
Chief Complaint: Back Pain, Headache, and Pelvic Pain   First Provider Initiated Contact with Patient 04/26/20 0258         SUBJECTIVE HPI: Carrie Nielsen is a 29 y.o. G3P2002 at [redacted]w[redacted]d by LMP who presents to maternity admissions reporting generalized abdominal pain for several days.  Worse today.  States has never had pain like this before.  Has some intermittent nausea and vomiting "like the usual pregnancy stuff".  Tried the warm bath and Tylenol that were suggested but they did not help.  Has had an US showing single live IUP.  Marland Kitchen She denies vaginal bleeding, vaginal itching/burning, urinary symptoms, h/a, dizziness, or fever/chills.    Back Pain This is a new problem. The current episode started 1 to 4 weeks ago. The problem occurs constantly. The problem is unchanged. The quality of the pain is described as aching. The pain is moderate. Stiffness is present all day. Associated symptoms include abdominal pain and headaches. Pertinent negatives include no dysuria or fever.  Abdominal Pain This is a new problem. The current episode started 1 to 4 weeks ago. The onset quality is gradual. The problem occurs constantly. The problem has been unchanged. The pain is located in the generalized abdominal region. The pain is moderate. The quality of the pain is colicky, cramping and sharp. The abdominal pain radiates to the back. Associated symptoms include headaches, nausea and vomiting. Pertinent negatives include no constipation, diarrhea, dysuria, fever, frequency or myalgias. The pain is aggravated by palpation and movement. The pain is relieved by nothing. She has tried acetaminophen for the symptoms. The treatment provided no relief.   RN Note: Carrie Nielsen is a 29 y.o. at [redacted]w[redacted]d here in MAU reporting: she began experiencing discomfort in her lower back and lower abdomen. She reports increased sweating and recurrent headaches. Pt reports she called MAU yesterday morning (04/25/20) at 0900 and they  instructed her to take Tylenol and a warm bath and if it did not ease her discomfort to come in. Pt reports she took 1500 mg Tylenol around 1900 and it did not help. Pt reports she is not able to keep anything down and has not been able to eat since 0800 yesterday morning but she vomited right after. No VB or abnormal discharge. LMP: 03/04/20 Onset of complaint: 04/24/20 Pain score: 8/10 in both lower abdomen and back.   Past Medical History:  Diagnosis Date  . History of cold sores   . Medical history non-contributory    Past Surgical History:  Procedure Laterality Date  . NO PAST SURGERIES    . WISDOM TOOTH EXTRACTION     Social History   Socioeconomic History  . Marital status: Single    Spouse name: Not on file  . Number of children: Not on file  . Years of education: Not on file  . Highest education level: Not on file  Occupational History  . Occupation: [employer]   Tobacco Use  . Smoking status: Never Smoker  . Smokeless tobacco: Never Used  Vaping Use  . Vaping Use: Never used  Substance and Sexual Activity  . Alcohol use: No    Alcohol/week: 0.0 standard drinks  . Drug use: No  . Sexual activity: Yes    Partners: Male  Other Topics Concern  . Not on file  Social History Narrative  . Not on file   Social Determinants of Health   Financial Resource Strain:   . Difficulty of Paying Living Expenses:   Food Insecurity:   .  Worried About Charity fundraiser in the Last Year:   . Arboriculturist in the Last Year:   Transportation Needs:   . Film/video editor (Medical):   Marland Kitchen Lack of Transportation (Non-Medical):   Physical Activity:   . Days of Exercise per Week:   . Minutes of Exercise per Session:   Stress:   . Feeling of Stress :   Social Connections:   . Frequency of Communication with Friends and Family:   . Frequency of Social Gatherings with Friends and Family:   . Attends Religious Services:   . Active Member of Clubs or Organizations:   .  Attends Archivist Meetings:   Marland Kitchen Marital Status:   Intimate Partner Violence:   . Fear of Current or Ex-Partner:   . Emotionally Abused:   Marland Kitchen Physically Abused:   . Sexually Abused:    No current facility-administered medications on file prior to encounter.   Current Outpatient Medications on File Prior to Encounter  Medication Sig Dispense Refill  . Etonogestrel (NEXPLANON [place]) Inject 1 application into the skin once.    Marland Kitchen HYDROcodone-acetaminophen (NORCO/VICODIN) 5-325 MG tablet Take 2 tablets by mouth every 4 (four) hours as needed. (Patient not taking: Reported on 07/12/2019) 15 tablet 0  . ibuprofen (ADVIL) 600 MG tablet Take 1 tablet (600 mg total) by mouth every 6 (six) hours as needed. (Patient not taking: Reported on 02/27/2020) 30 tablet 1  . naproxen (NAPROSYN) 500 MG tablet Take 1 tablet (500 mg total) by mouth 2 (two) times daily. (Patient not taking: Reported on 05/21/2019) 30 tablet 0  . oxyCODONE-acetaminophen (PERCOCET/ROXICET) 5-325 MG tablet Take 1 tablet by mouth every 4 (four) hours as needed for severe pain. (Patient not taking: Reported on 05/26/2019) 15 tablet 0  . sulfamethoxazole-trimethoprim (BACTRIM DS) 800-160 MG tablet Take 1 tablet by mouth 2 (two) times daily. (Patient not taking: Reported on 07/12/2019) 10 tablet 0   No Known Allergies  I have reviewed patient's Past Medical Hx, Surgical Hx, Family Hx, Social Hx, medications and allergies.   ROS:  Review of Systems  Constitutional: Negative for fever.  Gastrointestinal: Positive for abdominal pain, nausea and vomiting. Negative for constipation and diarrhea.  Genitourinary: Negative for dysuria and frequency.  Musculoskeletal: Negative for myalgias.  Neurological: Positive for headaches.   Review of Systems  Other systems negative   Physical Exam  Physical Exam Patient Vitals for the past 24 hrs:  BP Temp Temp src Pulse Resp  04/26/20 0240 117/78 98.5 F (36.9 C) Oral 84 15   Constitutional:  Well-developed, well-nourished female in no acute distress.  Cardiovascular: normal rate Respiratory: normal effort GI: Abd soft, Generalized tenderness  Some guarding, no rebound. Pos BS x 4 MS: Extremities nontender, no edema, normal ROM Neurologic: Alert and oriented x 4.  GU: Neg CVAT.  PELVIC EXAM: deferred due to location of pain.    LAB RESULTS Results for orders placed or performed during the hospital encounter of 04/26/20 (from the past 24 hour(s))  CBC with Differential/Platelet     Status: None   Collection Time: 04/26/20  3:15 AM  Result Value Ref Range   WBC 9.8 4.0 - 10.5 K/uL   RBC 4.45 3.87 - 5.11 MIL/uL   Hemoglobin 13.9 12.0 - 15.0 g/dL   HCT 40.2 36 - 46 %   MCV 90.3 80.0 - 100.0 fL   MCH 31.2 26.0 - 34.0 pg   MCHC 34.6 30.0 - 36.0 g/dL  RDW 12.1 11.5 - 15.5 %   Platelets 273 150 - 400 K/uL   nRBC 0.0 0.0 - 0.2 %   Neutrophils Relative % 75 %   Neutro Abs 7.3 1.7 - 7.7 K/uL   Lymphocytes Relative 19 %   Lymphs Abs 1.9 0.7 - 4.0 K/uL   Monocytes Relative 5 %   Monocytes Absolute 0.5 0 - 1 K/uL   Eosinophils Relative 1 %   Eosinophils Absolute 0.1 0 - 0 K/uL   Basophils Relative 0 %   Basophils Absolute 0.0 0 - 0 K/uL   Immature Granulocytes 0 %   Abs Immature Granulocytes 0.04 0.00 - 0.07 K/uL  Comprehensive metabolic panel     Status: Abnormal   Collection Time: 04/26/20  3:15 AM  Result Value Ref Range   Sodium 134 (L) 135 - 145 mmol/L   Potassium 3.4 (L) 3.5 - 5.1 mmol/L   Chloride 102 98 - 111 mmol/L   CO2 26 22 - 32 mmol/L   Glucose, Bld 101 (H) 70 - 99 mg/dL   BUN 10 6 - 20 mg/dL   Creatinine, Ser 7.35 0.44 - 1.00 mg/dL   Calcium 9.2 8.9 - 32.9 mg/dL   Total Protein 6.7 6.5 - 8.1 g/dL   Albumin 3.5 3.5 - 5.0 g/dL   AST 25 15 - 41 U/L   ALT 37 0 - 44 U/L   Alkaline Phosphatase 42 38 - 126 U/L   Total Bilirubin 0.6 0.3 - 1.2 mg/dL   GFR calc non Af Amer >60 >60 mL/min   GFR calc Af Amer >60 >60 mL/min   Anion gap 6 5 - 15  Lipase, blood      Status: None   Collection Time: 04/26/20  3:15 AM  Result Value Ref Range   Lipase 20 11 - 51 U/L  Amylase     Status: Abnormal   Collection Time: 04/26/20  3:15 AM  Result Value Ref Range   Amylase 125 (H) 28 - 100 U/L  Urinalysis, Routine w reflex microscopic     Status: Abnormal   Collection Time: 04/26/20  3:21 AM  Result Value Ref Range   Color, Urine YELLOW YELLOW   APPearance HAZY (A) CLEAR   Specific Gravity, Urine 1.027 1.005 - 1.030   pH 5.0 5.0 - 8.0   Glucose, UA NEGATIVE NEGATIVE mg/dL   Hgb urine dipstick NEGATIVE NEGATIVE   Bilirubin Urine NEGATIVE NEGATIVE   Ketones, ur NEGATIVE NEGATIVE mg/dL   Protein, ur NEGATIVE NEGATIVE mg/dL   Nitrite NEGATIVE NEGATIVE   Leukocytes,Ua MODERATE (A) NEGATIVE   RBC / HPF 0-5 0 - 5 RBC/hpf   WBC, UA 6-10 0 - 5 WBC/hpf   Bacteria, UA RARE (A) NONE SEEN   Squamous Epithelial / LPF 6-10 0 - 5   Mucus PRESENT      IMAGING Bedside US done to confirm Fetal Viability>> FHR 160, single IUP with normal gestational sac.  MAU Management/MDM: Ordered labs to assess for acute abdominal processes IV hydration ordered.  Patient declines antiemetic Labs normal with no leukocytosis, except for slight elevation in Amylase which is a nonspecific finding.    Treatments in MAU included Levsin and Simethicone.  These afforded good relief of her generalized colicky abdominal pain.  She also received IV hydration due to report of excessive nausea and vomiting (though reported no nausea here)..  Reviewed findings of fetal assessment including US findings of live fetus c/w GA  ASSESSMENT Single IUP at [redacted]w[redacted]d Generalized  abdominal pain Nausea and vomiting of pregnancy Mild dehydration  PLAN Discharge home Urine to culture Rx Simethicone for abdominal gas pains Rx Phenergan for nausea and vomiting Recommend starting prenatal care soon List of OB providers in the community provided Encouraged to return here or to other Urgent Care/ED if  she develops worsening of symptoms, increase in pain, fever, or other concerning symptoms.    Wynelle Bourgeois CNM, MSN Certified Nurse-Midwife 04/26/2020  2:58 AM

## 2020-04-26 NOTE — Discharge Instructions (Signed)
Abdominal Pain During Pregnancy  Abdominal pain is common during pregnancy, and has many possible causes. Some causes are more serious than others, and sometimes the cause is not known. Abdominal pain can be a sign that labor is starting. It can also be caused by normal growth and stretching of muscles and ligaments during pregnancy. Always tell your health care provider if you have any abdominal pain. Follow these instructions at home:  Do not have sex or put anything in your vagina until your pain goes away completely.  Get plenty of rest until your pain improves.  Drink enough fluid to keep your urine pale yellow.  Take over-the-counter and prescription medicines only as told by your health care provider.  Keep all follow-up visits as told by your health care provider. This is important. Contact a health care provider if:  Your pain continues or gets worse after resting.  You have lower abdominal pain that: ? Comes and goes at regular intervals. ? Spreads to your back. ? Is similar to menstrual cramps.  You have pain or burning when you urinate. Get help right away if:  You have a fever or chills.  You have vaginal bleeding.  You are leaking fluid from your vagina.  You are passing tissue from your vagina.  You have vomiting or diarrhea that lasts for more than 24 hours.  Your baby is moving less than usual.  You feel very weak or faint.  You have shortness of breath.  You develop severe pain in your upper abdomen. Summary  Abdominal pain is common during pregnancy, and has many possible causes.  If you experience abdominal pain during pregnancy, tell your health care provider right away.  Follow your health care provider's home care instructions and keep all follow-up visits as directed. This information is not intended to replace advice given to you by your health care provider. Make sure you discuss any questions you have with your health care  provider. Document Revised: 02/13/2019 Document Reviewed: 01/28/2017 Elsevier Patient Education  2020 Elsevier Inc.   [place] [facility] Providers    [facility] [employer] [facility]       Phone: [phone]  [facility] [employer] at [place]   Phone: [phone]  [facility] [employer] at [place]  Phone: [phone]  [facility] for [facility] at [facility]  Phone: [phone]  [facility] for [facility] at [place]  Phone: [phone]  [facility]   Phone: [phone]  [facility]       Phone: [phone]  [facility]    Phone: [phone]   [facility]    Phone: [phone]  [facility]    Phone: [phone]  [facility]    Phone: [phone]  [facility]       Phone: [phone]   [facility]  Phone: [phone]  [facility]    Phone: [phone]  [facility]   Phone: [phone]  [facility]      Phone: [phone]  [facility]    Phone: [phone]

## 2020-04-26 NOTE — MAU Note (Signed)
...  Carrie Nielsen is a 29 y.o. at [redacted]w[redacted]d here in MAU reporting: she began experiencing discomfort in her lower back and lower abdomen. She reports increased sweating and recurrent headaches. Pt reports she called MAU yesterday morning (04/25/20) at 0900 and they instructed her to take Tylenol and a warm bath and if it did not ease her discomfort to come in. Pt reports she took 1500 mg Tylenol around 1900 and it did not help. Pt reports she is not able to keep anything down and has not been able to eat since 0800 yesterday morning but she vomited right after. No VB or abnormal discharge.  LMP: 03/04/20 Onset of complaint: 04/24/20 Pain score: 8/10 in both lower abdomen and back.

## 2020-04-27 LAB — CULTURE, OB URINE

## 2020-05-01 ENCOUNTER — Telehealth (INDEPENDENT_AMBULATORY_CARE_PROVIDER_SITE_OTHER): Payer: Self-pay | Admitting: *Deleted

## 2020-05-01 DIAGNOSIS — O418X10 Other specified disorders of amniotic fluid and membranes, first trimester, not applicable or unspecified: Secondary | ICD-10-CM

## 2020-05-01 DIAGNOSIS — O468X1 Other antepartum hemorrhage, first trimester: Secondary | ICD-10-CM

## 2020-05-01 NOTE — Progress Notes (Signed)
Patient was assessed and managed by nursing staff during this encounter. I have reviewed the chart and agree with the documentation and plan. I have also made any necessary editorial changes.  Thressa Sheller DNP, CNM  05/01/20  2:44 PM

## 2020-05-01 NOTE — Progress Notes (Signed)
2:17 I called Cherlyn's mobile number for her New OB Intake visit today and heard a message " your call cannot be completed at this time". I also called her home phone number and heard the same message.  Kirk Basquez,RN 2:20 I called Dory's mobile and home numbers again and again heard the message " your call cannot be completed at this time:"  I also called her contact Debora's number and spoke with a female who identified as Event organiser. I explained I was trying to reach [facility]. She confirmed Ananiah was not there but that she would give her the message to call us.   Dempsey Ahonen,RN

## 2020-05-14 ENCOUNTER — Encounter: Payer: Self-pay | Admitting: Advanced Practice Midwife

## 2020-05-27 ENCOUNTER — Encounter (HOSPITAL_COMMUNITY): Payer: Self-pay

## 2020-05-27 ENCOUNTER — Other Ambulatory Visit: Payer: Self-pay

## 2020-05-27 ENCOUNTER — Emergency Department (HOSPITAL_COMMUNITY)
Admission: EM | Admit: 2020-05-27 | Discharge: 2020-05-27 | Disposition: A | Discharge status: Home / Self Care | Payer: Self-pay | Attending: Emergency Medicine | Admitting: Emergency Medicine

## 2020-05-27 DIAGNOSIS — K130 Diseases of lips: Secondary | ICD-10-CM | POA: Insufficient documentation

## 2020-05-27 DIAGNOSIS — B001 Herpesviral vesicular dermatitis: Secondary | ICD-10-CM | POA: Insufficient documentation

## 2020-05-27 MED ORDER — VALACYCLOVIR HCL 1 G PO TABS
2000.0000 mg | ORAL_TABLET | Freq: Once | ORAL | 0 refills | Status: AC
Start: 2020-05-27 — End: 2020-05-27

## 2020-05-27 MED ORDER — VALACYCLOVIR HCL 500 MG PO TABS
2000.0000 mg | ORAL_TABLET | ORAL | Status: AC
  Administered 2020-05-27: 2000 mg via ORAL
  Filled 2020-05-27: qty 4

## 2020-05-27 NOTE — ED Provider Notes (Signed)
[facility]-EMERGENCY DEPT Provider Note   CSN: [id number] Arrival date & time: 05/27/20  1424     History Chief Complaint  Patient presents with  . Allergic Reaction  . Oral Swelling    Carrie Nielsen is a 29 y.o. female.  HPI Patient is a 29 year old female with a history of cold sores presented today with 2 cold sores to her upper lip and one ulcer on the lower lip. He states that her symptoms began 2 days ago. They have gotten worse. She has tried no medications prior to arrival. She states they are achy burning and uncomfortable.  She states that they were worse when eating solid aggravators.  She denies any mitigating factors.  She denies any radiation of pain.  Denies any associated fever, eye pain, ear pain, chest pain, shortness of breath, rashes on her face body or vagina.     Past Medical History:  Diagnosis Date  . History of cold sores   . Medical history non-contributory     Patient Active Problem List   Diagnosis Date Noted  . Bartholin cyst 04/26/2020  . Subchorionic hemorrhage in first trimester 04/26/2020  . 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 Esophageal reflux 02/08/2013    Past Surgical History:  Procedure Laterality Date  . NO PAST SURGERIES    . WISDOM TOOTH EXTRACTION       OB History    Gravida  3   Para  2   Term  2   Preterm      AB      Living  2     SAB      TAB      Ectopic      Multiple  0   Live Births  2           Family History  Problem Relation Age of Onset  . Cancer Maternal Grandfather        brain  . Alcohol abuse Neg Hx   . Arthritis Neg Hx   . Asthma Neg Hx   . Birth defects Neg Hx   . COPD Neg Hx   . Depression Neg Hx   . Diabetes Neg Hx   . Early death Neg Hx   . Drug abuse Neg Hx   . Hearing loss Neg Hx   . Heart disease Neg Hx   . Hyperlipidemia Neg Hx   . Hypertension Neg Hx   . Kidney disease Neg Hx   . Learning disabilities Neg Hx   . Mental  illness Neg Hx   . Mental retardation Neg Hx   . Miscarriages / Stillbirths Neg Hx   . Stroke Neg Hx   . Vision loss Neg Hx     Social History   Tobacco Use  . Smoking status: Never Smoker  . Smokeless tobacco: Never Used  Vaping Use  . Vaping Use: Never used  Substance Use Topics  . Alcohol use: No    Alcohol/week: 0.0 standard drinks  . Drug use: No    Home Medications Prior to Admission medications   Medication Sig Start Date End Date Taking? Authorizing Provider  promethazine (PHENERGAN) 25 MG tablet Take 1 tablet (25 mg total) by mouth every 6 (six) hours as needed for nausea or vomiting. 04/26/20   Aviva Signs, CNM  simethicone (MYLICON) 80 MG chewable tablet Chew 1 tablet (80 mg total) by mouth 4 (four) times daily as needed for flatulence. 04/26/20  Aviva Signs, CNM  valACYclovir (VALTREX) 1000 MG tablet Take 2 tablets (2,000 mg total) by mouth once for 1 dose. 05/27/20 05/27/20  Gailen Shelter, PA    Allergies    Patient has no known allergies.  Review of Systems   Review of Systems  Constitutional: Negative for fever.  HENT: Negative for congestion.        Mouth sores  Respiratory: Negative for shortness of breath.   Cardiovascular: Negative for chest pain.  Gastrointestinal: Negative for abdominal distention.  Neurological: Negative for dizziness and headaches.    Physical Exam Updated Vital Signs BP 132/71 (BP Location: Right Arm)   Pulse 62   Temp 98 F (36.7 C) (Oral)   Resp 16   Ht 6' (1.829 m)   Wt 74.4 kg   LMP 03/04/2020   SpO2 99%   BMI 22.24 kg/m   Physical Exam Vitals and nursing note reviewed.  Constitutional:      General: She is not in acute distress.    Appearance: Normal appearance. She is not ill-appearing.  HENT:     Head: Normocephalic and atraumatic.     Mouth/Throat:     Mouth: Mucous membranes are moist.     Comments: 2 vesicular lesions to the upper lip, one vesicular lesion to the lower lip. Eyes:      General: No scleral icterus.       Right eye: No discharge.        Left eye: No discharge.     Conjunctiva/sclera: Conjunctivae normal.  Pulmonary:     Effort: Pulmonary effort is normal.     Breath sounds: No stridor.  Neurological:     Mental Status: She is alert and oriented to person, place, and time. Mental status is at baseline.     ED Results / Procedures / Treatments   Labs (all labs ordered are listed, but only abnormal results are displayed) Labs Reviewed - No data to display  EKG None  Radiology No results found.  Procedures Procedures (including critical care time)  Medications Ordered in ED Medications  valACYclovir (VALTREX) tablet 2,000 mg (2,000 mg Oral Given 05/27/20 1857)    ED Course  I have reviewed the triage vital signs and the nursing notes.  Pertinent labs & imaging results that were available during my care of the patient were reviewed by me and considered in my medical decision making (see chart for details).    MDM Rules/Calculators/A&P                          Patient with history of HSV cold sores. She denies any vaginal sores. She states that she has used Valtrex in the past with good results. She states her symptoms started 2 days ago  Patient was provided with a single dose of Valtrex here and a single dose to go home with prescription why is that she will take an 12 hours.  She is agreeable to plan.  Final Clinical Impression(s) / ED Diagnoses Final diagnoses:  Cold sore    Rx / DC Orders ED Discharge Orders         Ordered    valACYclovir (VALTREX) 1000 MG tablet   Once     Discontinue  Reprint     05/27/20 1857           Gailen Shelter, [place] 05/27/20 2342    Sabino Donovan, MD 05/28/20 1459

## 2020-05-27 NOTE — ED Triage Notes (Signed)
Patient states she had an allergic reaction and lip swelling since yesterday. Patient denies any breathing or swallowing issues.

## 2020-05-27 NOTE — Discharge Instructions (Addendum)
Please take medication as prescribed.  Please drink plenty of water.  You may use Abreva over-the-counter.  You can apply this to your lip over the cold sores.  Please take the rest of the Valtrex in 12 hours/tomorrow morning early.  You can also take lysine which is an amino acid that can help prevent cold sores.  This is an incredibly contagious disease.  Please do not share drinking glasses or conduct oral sex or kiss anyone.  You are able to transmit this virus even when no cultures are present.

## 2020-06-03 ENCOUNTER — Other Ambulatory Visit: Payer: Self-pay

## 2020-06-03 ENCOUNTER — Emergency Department (HOSPITAL_COMMUNITY): Payer: Self-pay

## 2020-06-03 ENCOUNTER — Encounter (HOSPITAL_COMMUNITY): Payer: Self-pay | Admitting: Emergency Medicine

## 2020-06-03 ENCOUNTER — Emergency Department (HOSPITAL_COMMUNITY)
Admission: EM | Admit: 2020-06-03 | Discharge: 2020-06-03 | Disposition: A | Discharge status: Home / Self Care | Payer: Self-pay | Attending: Emergency Medicine | Admitting: Emergency Medicine

## 2020-06-03 DIAGNOSIS — Y9289 Other specified places as the place of occurrence of the external cause: Secondary | ICD-10-CM | POA: Insufficient documentation

## 2020-06-03 DIAGNOSIS — S93602A Unspecified sprain of left foot, initial encounter: Secondary | ICD-10-CM | POA: Insufficient documentation

## 2020-06-03 DIAGNOSIS — S93402A Sprain of unspecified ligament of left ankle, initial encounter: Secondary | ICD-10-CM | POA: Insufficient documentation

## 2020-06-03 DIAGNOSIS — O9A312 Physical abuse complicating pregnancy, second trimester: Secondary | ICD-10-CM | POA: Insufficient documentation

## 2020-06-03 DIAGNOSIS — Y9389 Activity, other specified: Secondary | ICD-10-CM | POA: Insufficient documentation

## 2020-06-03 DIAGNOSIS — Z3A14 14 weeks gestation of pregnancy: Secondary | ICD-10-CM | POA: Insufficient documentation

## 2020-06-03 DIAGNOSIS — S90812A Abrasion, left foot, initial encounter: Secondary | ICD-10-CM | POA: Insufficient documentation

## 2020-06-03 DIAGNOSIS — Y999 Unspecified external cause status: Secondary | ICD-10-CM | POA: Insufficient documentation

## 2020-06-03 NOTE — Progress Notes (Signed)
Orthopedic Tech Progress Note Patient Details:  Carrie Nielsen [birth year] [id number]  Ortho Devices Ortho Device/Splint Location: LLE ankle air cast and crutches Ortho Device/Splint Interventions: Ordered, Application, Adjustment   Post Interventions Patient Tolerated: Well Instructions Provided: Care of device   Jennye Moccasin 06/03/2020, 1:16 PM
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
## 2020-06-03 NOTE — Discharge Instructions (Addendum)
Thank you for allowing [place] to care for you today. Please rest the left foot/ankle as much as possible. Use the crutches until you feel as though you are able to walk on the foot. Elevate the foot for any swelling and apply warm/cool compresses for any continued pain/swelling. Take tylenol for any continued pain. Apply neosporin to the cut on your left foot.   If you have any new or worsening symptoms such as loss of consciousness, sudden severe headache, dizziness, abdominal pain, vaginal bleeding please return to your closest ED. Please follow up with you primary care provider.
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
## 2020-06-03 NOTE — ED Notes (Signed)
Ortho tech called 

## 2020-08-26 ENCOUNTER — Ambulatory Visit (INDEPENDENT_AMBULATORY_CARE_PROVIDER_SITE_OTHER): Payer: Self-pay | Admitting: Obstetrics

## 2020-08-26 ENCOUNTER — Other Ambulatory Visit (HOSPITAL_COMMUNITY)
Admission: RE | Admit: 2020-08-26 | Discharge: 2020-08-26 | Disposition: A | Discharge status: Home / Self Care | Payer: Medicaid Other | Source: Ambulatory Visit | Attending: Obstetrics | Admitting: Obstetrics

## 2020-08-26 ENCOUNTER — Encounter: Payer: Self-pay | Admitting: Obstetrics

## 2020-08-26 ENCOUNTER — Other Ambulatory Visit: Payer: Self-pay

## 2020-08-26 VITALS — BP 117/71 | HR 89 | Wt 180.0 lb

## 2020-08-26 DIAGNOSIS — Z3400 Encounter for supervision of normal first pregnancy, unspecified trimester: Secondary | ICD-10-CM

## 2020-08-26 DIAGNOSIS — Z3A26 26 weeks gestation of pregnancy: Secondary | ICD-10-CM

## 2020-08-26 DIAGNOSIS — Z3482 Encounter for supervision of other normal pregnancy, second trimester: Secondary | ICD-10-CM | POA: Diagnosis not present

## 2020-08-26 DIAGNOSIS — A6000 Herpesviral infection of urogenital system, unspecified: Secondary | ICD-10-CM

## 2020-08-26 MED ORDER — PRENATE PIXIE 10-0.6-0.4-200 MG PO CAPS: 1.0000 | ORAL_CAPSULE | Freq: Every day | ORAL | 11 refills | Status: DC

## 2020-08-26 NOTE — Addendum Note (Signed)
Addended by: Marya Landry D on: 08/26/2020 02:58 PM   Modules accepted: Orders

## 2020-08-26 NOTE — Progress Notes (Signed)
Subjective:    Carrie Nielsen is being seen today for her first obstetrical visit.  This is a planned pregnancy. She is at [redacted]w[redacted]d gestation. Her obstetrical history is significant for none. Relationship with FOB: significant other, not living together. Patient does not intend to breast feed. Pregnancy history fully reviewed.  The information documented in the HPI was reviewed and verified.  Menstrual History: OB History    Gravida  3   Para  2   Term  2   Preterm      AB      Living  2     SAB      TAB      Ectopic      Multiple  0   Live Births  2            Patient's last menstrual period was 03/04/2020.    Past Medical History:  Diagnosis Date  . History of cold sores   . Medical history non-contributory     Past Surgical History:  Procedure Laterality Date  . NO PAST SURGERIES    . WISDOM TOOTH EXTRACTION      (Not in a hospital admission)  No Known Allergies  Social History   Tobacco Use  . Smoking status: Never Smoker  . Smokeless tobacco: Never Used  Substance Use Topics  . Alcohol use: No    Alcohol/week: 0.0 standard drinks    Family History  Problem Relation Age of Onset  . Cancer Maternal Grandfather        brain  . Alcohol abuse Neg Hx   . Arthritis Neg Hx   . Asthma Neg Hx   . Birth defects Neg Hx   . COPD Neg Hx   . Depression Neg Hx   . Diabetes Neg Hx   . Early death Neg Hx   . Drug abuse Neg Hx   . Hearing loss Neg Hx   . Heart disease Neg Hx   . Hyperlipidemia Neg Hx   . Hypertension Neg Hx   . Kidney disease Neg Hx   . Learning disabilities Neg Hx   . Mental illness Neg Hx   . Mental retardation Neg Hx   . Miscarriages / Stillbirths Neg Hx   . Stroke Neg Hx   . Vision loss Neg Hx      Review of Systems Constitutional: negative for weight loss Gastrointestinal: negative for vomiting Genitourinary:negative for genital lesions and vaginal discharge and dysuria Musculoskeletal:negative for back  pain Behavioral/Psych: negative for abusive relationship, depression, illegal drug usage and tobacco use    Objective:    BP 117/71   Pulse 89   Wt 180 lb (81.6 kg)   LMP 03/04/2020   BMI 25.10 kg/m  General Appearance:    Alert, cooperative, no distress, appears stated age  Head:    Normocephalic, without obvious abnormality, atraumatic  Eyes:    PERRL, conjunctiva/corneas clear, EOM's intact, fundi    benign, both eyes  Ears:    Normal TM's and external ear canals, both ears  Nose:   Nares normal, septum midline, mucosa normal, no drainage    or sinus tenderness  Throat:   Lips, mucosa, and tongue normal; teeth and gums normal  Neck:   Supple, symmetrical, trachea midline, no adenopathy;    thyroid:  no enlargement/tenderness/nodules; no carotid   bruit or JVD  Back:     Symmetric, no curvature, ROM normal, no CVA tenderness  Lungs:     Clear to  auscultation bilaterally, respirations unlabored  Chest Wall:    No tenderness or deformity   Heart:    Regular rate and rhythm, S1 and S2 normal, no murmur, rub   or gallop  Breast Exam:    No tenderness, masses, or nipple abnormality  Abdomen:     Soft, non-tender, bowel sounds active all four quadrants,    no masses, no organomegaly  Genitalia:    Normal female without lesion, discharge or tenderness  Extremities:   Extremities normal, atraumatic, no cyanosis or edema  Pulses:   2+ and symmetric all extremities  Skin:   Skin color, texture, turgor normal, no rashes or lesions  Lymph nodes:   Cervical, supraclavicular, and axillary nodes normal  Neurologic:   CNII-XII intact, normal strength, sensation and reflexes    throughout      Lab Review Urine pregnancy test Labs reviewed yes Radiologic studies reviewed no  Assessment:    Pregnancy at [redacted]w[redacted]d weeks    Plan:     1. Supervision of normal first pregnancy, antepartum Rx: - Cytology - PAP( [facility]) - Cervicovaginal ancillary only( [facility]) -  CBC/D/Plt+RPR+Rh+ABO+Rub Ab... - Culture, OB Urine - [place] MFM OB DETAIL +14 WK; Future - Enroll Patient in Babyscripts - Babyscripts Schedule Optimization - Prenat-FeAsp-Meth-FA-DHA w/o A (PRENATE PIXIE) 10-0.6-0.4-200 MG CAPS; Take 1 capsule by mouth daily.  Dispense: 30 capsule; Refill: 11 - Genetic Screening  2. Genital herpes simplex, unspecified site - stable clinically.  Start suppression at ~ 32 weeks.   Prenatal vitamins.  Counseling provided regarding continued use of seat belts, cessation of alcohol consumption, smoking or use of illicit drugs; infection precautions i.e., influenza/TDAP immunizations, toxoplasmosis,CMV, parvovirus, listeria and varicella; workplace safety, exercise during pregnancy; routine dental care, safe medications, sexual activity, hot tubs, saunas, pools, travel, caffeine use, fish and methlymercury, potential toxins, hair treatments, varicose veins Weight gain recommendations per IOM guidelines reviewed: underweight/BMI< 18.5--> gain 28 - 40 lbs; normal weight/BMI 18.5 - 24.9--> gain 25 - 35 lbs; overweight/BMI 25 - 29.9--> gain 15 - 25 lbs; obese/BMI >30->gain  11 - 20 lbs Problem list reviewed and updated. FIRST/CF mutation testing/NIPT/QUAD SCREEN/fragile X/Ashkenazi Jewish population testing/Spinal muscular atrophy discussed: requested. Role of ultrasound in pregnancy discussed; fetal survey: requested. Amniocentesis discussed: not indicated.  Meds ordered this encounter  Medications  . Prenat-FeAsp-Meth-FA-DHA w/o A (PRENATE PIXIE) 10-0.6-0.4-200 MG CAPS    Sig: Take 1 capsule by mouth daily.    Dispense:  30 capsule    Refill:  11   Orders Placed This Encounter  Procedures  . Culture, OB Urine  . [place] MFM OB DETAIL +14 WK    Standing Status:   Future    Standing Expiration Date:   08/26/2021    Order Specific Question:   Reason for Exam (SYMPTOM  OR DIAGNOSIS REQUIRED)    Answer:   anatomy, late to care    Order Specific Question:   Preferred  Location    Answer:   [facility] Ultrasound  . CBC/D/Plt+RPR+Rh+ABO+Rub Ab...  . Genetic Screening    Follow up in 2 weeks.  50% of 20 min visit spent on counseling and coordination of care.      Brock Bad, MD 08/26/2020 2:09 PM

## 2020-08-26 NOTE — Addendum Note (Signed)
Addended by: Cheree Ditto, Romulus Hanrahan A on: 08/26/2020 02:36 PM   Modules accepted: Orders

## 2020-08-27 ENCOUNTER — Other Ambulatory Visit: Payer: Self-pay | Admitting: Obstetrics

## 2020-08-27 DIAGNOSIS — B9689 Other specified bacterial agents as the cause of diseases classified elsewhere: Secondary | ICD-10-CM

## 2020-08-27 DIAGNOSIS — A749 Chlamydial infection, unspecified: Secondary | ICD-10-CM

## 2020-08-27 DIAGNOSIS — B379 Candidiasis, unspecified: Secondary | ICD-10-CM

## 2020-08-27 DIAGNOSIS — A599 Trichomoniasis, unspecified: Secondary | ICD-10-CM

## 2020-08-27 LAB — CBC/D/PLT+RPR+RH+ABO+RUB AB...
Antibody Screen: NEGATIVE
Basophils Absolute: 0 10*3/uL (ref 0.0–0.2)
Basos: 0 %
EOS (ABSOLUTE): 0.1 10*3/uL (ref 0.0–0.4)
Eos: 1 %
HCV Ab: <0.1 s/co ratio (ref 0.0–0.9)
HIV Screen 4th Generation wRfx: NONREACTIVE
Hematocrit: 36.3 % (ref 34.0–46.6)
Hemoglobin: 12.6 g/dL (ref 11.1–15.9)
Hepatitis B Surface Ag: NEGATIVE
Immature Grans (Abs): 0.1 10*3/uL (ref 0.0–0.1)
Immature Granulocytes: 1 %
Lymphocytes Absolute: 1.8 10*3/uL (ref 0.7–3.1)
Lymphs: 16 %
MCH: 31.6 pg (ref 26.6–33.0)
MCHC: 34.7 g/dL (ref 31.5–35.7)
MCV: 91 fL (ref 79–97)
Monocytes Absolute: 0.6 10*3/uL (ref 0.1–0.9)
Monocytes: 6 %
Neutrophils Absolute: 8.5 10*3/uL — ABNORMAL HIGH (ref 1.4–7.0)
Neutrophils: 76 %
Platelets: 256 10*3/uL (ref 150–450)
RBC: 3.99 x10E6/uL (ref 3.77–5.28)
RDW: 12.2 % (ref 11.7–15.4)
RPR Ser Ql: NONREACTIVE
Rh Factor: POSITIVE
Rubella Antibodies, IGG: 1.42 index (ref >0.99)
WBC: 11.1 10*3/uL — ABNORMAL HIGH (ref 3.4–10.8)

## 2020-08-27 LAB — CERVICOVAGINAL ANCILLARY ONLY
Bacterial Vaginitis (gardnerella): POSITIVE — AB
Candida Glabrata: NEGATIVE
Candida Vaginitis: POSITIVE — AB
Chlamydia: POSITIVE — AB
Comment: NEGATIVE
Comment: NEGATIVE
Comment: NEGATIVE
Comment: NEGATIVE
Comment: NEGATIVE
Comment: NORMAL
Neisseria Gonorrhea: NEGATIVE
Trichomonas: POSITIVE — AB

## 2020-08-27 LAB — HCV INTERPRETATION

## 2020-08-27 MED ORDER — METRONIDAZOLE 500 MG PO TABS: 500.0000 mg | ORAL_TABLET | Freq: Two times a day (BID) | ORAL | 2 refills | Status: DC

## 2020-08-27 MED ORDER — TERCONAZOLE 0.4 % VA CREA: 1.0000 | TOPICAL_CREAM | Freq: Every day | VAGINAL | 0 refills | Status: DC

## 2020-08-27 MED ORDER — AZITHROMYCIN 500 MG PO TABS: 1000.0000 mg | ORAL_TABLET | Freq: Once | ORAL | 0 refills | Status: AC

## 2020-08-28 LAB — CULTURE, OB URINE

## 2020-08-28 LAB — URINE CULTURE, OB REFLEX

## 2020-08-30 ENCOUNTER — Telehealth: Payer: Self-pay

## 2020-08-30 NOTE — Telephone Encounter (Signed)
Patient has been giving test results and has been informed to go and pick up her medications. She will need a test of cure in 4 weeks. Patient will call back to schedule.

## 2020-08-30 NOTE — Progress Notes (Signed)
Letter typed to send to letter since I have not been able to reach pt by phone regarding +STD results.

## 2020-09-02 ENCOUNTER — Encounter: Payer: Self-pay | Admitting: Obstetrics

## 2020-09-02 ENCOUNTER — Other Ambulatory Visit: Payer: Self-pay | Admitting: Obstetrics

## 2020-09-02 LAB — CYTOLOGY - PAP
Comment: NEGATIVE
Comment: NEGATIVE
Diagnosis: UNDETERMINED — AB
HPV 16: NEGATIVE
HPV 18 / 45: NEGATIVE
High risk HPV: POSITIVE — AB

## 2020-09-09 ENCOUNTER — Ambulatory Visit: Payer: No Typology Code available for payment source

## 2020-09-10 ENCOUNTER — Other Ambulatory Visit: Payer: Self-pay

## 2020-09-10 ENCOUNTER — Ambulatory Visit (INDEPENDENT_AMBULATORY_CARE_PROVIDER_SITE_OTHER): Payer: Medicaid Other | Admitting: Obstetrics and Gynecology

## 2020-09-10 DIAGNOSIS — Z3483 Encounter for supervision of other normal pregnancy, third trimester: Secondary | ICD-10-CM | POA: Insufficient documentation

## 2020-09-10 DIAGNOSIS — O0933 Supervision of pregnancy with insufficient antenatal care, third trimester: Secondary | ICD-10-CM | POA: Insufficient documentation

## 2020-09-10 DIAGNOSIS — Z3A28 28 weeks gestation of pregnancy: Secondary | ICD-10-CM | POA: Insufficient documentation

## 2020-09-10 DIAGNOSIS — O418X10 Other specified disorders of amniotic fluid and membranes, first trimester, not applicable or unspecified: Secondary | ICD-10-CM

## 2020-09-10 DIAGNOSIS — O468X1 Other antepartum hemorrhage, first trimester: Secondary | ICD-10-CM

## 2020-09-10 NOTE — Patient Instructions (Signed)
Glucose Tolerance Test During Pregnancy Why am I having this test? The glucose tolerance test (GTT) is done to check how your body processes sugar (glucose). This is one of several tests used to diagnose diabetes that develops during pregnancy (gestational diabetes mellitus). Gestational diabetes is a temporary form of diabetes that some women develop during pregnancy. It usually occurs during the second trimester of pregnancy and goes away after delivery. Testing (screening) for gestational diabetes usually occurs between 24 and 28 weeks of pregnancy. You may have the GTT test after having a 1-hour glucose screening test if the results from that test indicate that you may have gestational diabetes. You may also have this test if:  You have a history of gestational diabetes.  You have a history of giving birth to very large babies or have experienced repeated fetal loss (stillbirth).  You have signs and symptoms of diabetes, such as: ? Changes in your vision. ? Tingling or numbness in your hands or feet. ? Changes in hunger, thirst, and urination that are not otherwise explained by your pregnancy. What is being tested? This test measures the amount of glucose in your blood at different times during a period of 3 hours. This indicates how well your body is able to process glucose. What kind of sample is taken?  Blood samples are required for this test. They are usually collected by inserting a needle into a blood vessel. How do I prepare for this test?  For 3 days before your test, eat normally. Have plenty of carbohydrate-rich foods.  Follow instructions from your health care provider about: ? Eating or drinking restrictions on the day of the test. You may be asked to not eat or drink anything other than water (fast) starting 8-10 hours before the test. ? Changing or stopping your regular medicines. Some medicines may interfere with this test. Tell a health care provider about:  All  medicines you are taking, including vitamins, herbs, eye drops, creams, and over-the-counter medicines.  Any blood disorders you have.  Any surgeries you have had.  Any medical conditions you have. What happens during the test? First, your blood glucose will be measured. This is referred to as your fasting blood glucose, since you fasted before the test. Then, you will drink a glucose solution that contains a certain amount of glucose. Your blood glucose will be measured again 1, 2, and 3 hours after drinking the solution. This test takes about 3 hours to complete. You will need to stay at the testing location during this time. During the testing period:  Do not eat or drink anything other than the glucose solution.  Do not exercise.  Do not use any products that contain nicotine or tobacco, such as cigarettes and e-cigarettes. If you need help stopping, ask your health care provider. The testing procedure may vary among health care providers and hospitals. How are the results reported? Your results will be reported as milligrams of glucose per deciliter of blood (mg/dL) or millimoles per liter (mmol/L). Your health care provider will compare your results to normal ranges that were established after testing a large group of people (reference ranges). Reference ranges may vary among labs and hospitals. For this test, common reference ranges are:  Fasting: less than 95-105 mg/dL (5.3-5.8 mmol/L).  1 hour after drinking glucose: less than 180-190 mg/dL (10.0-10.5 mmol/L).  2 hours after drinking glucose: less than 155-165 mg/dL (8.6-9.2 mmol/L).  3 hours after drinking glucose: 140-145 mg/dL (7.8-8.1 mmol/L). What do the   results mean? Results within reference ranges are considered normal, meaning that your glucose levels are well-controlled. If two or more of your blood glucose levels are high, you may be diagnosed with gestational diabetes. If only one level is high, your health care  provider may suggest repeat testing or other tests to confirm a diagnosis. Talk with your health care provider about what your results mean. Questions to ask your health care provider Ask your health care provider, or the department that is doing the test:  When will my results be ready?  How will I get my results?  What are my treatment options?  What other tests do I need?  What are my next steps? Summary  The glucose tolerance test (GTT) is one of several tests used to diagnose diabetes that develops during pregnancy (gestational diabetes mellitus). Gestational diabetes is a temporary form of diabetes that some women develop during pregnancy.  You may have the GTT test after having a 1-hour glucose screening test if the results from that test indicate that you may have gestational diabetes. You may also have this test if you have any symptoms or risk factors for gestational diabetes.  Talk with your health care provider about what your results mean. This information is not intended to replace advice given to you by your health care provider. Make sure you discuss any questions you have with your health care provider. Document Revised: 02/16/2019 Document Reviewed: 06/07/2017 Elsevier Patient Education  2020 Elsevier Inc[employer]  

## 2020-09-10 NOTE — Progress Notes (Signed)
   PRENATAL VISIT NOTE  Subjective:  Carrie Nielsen is a 29 y.o. G3P2002 at [redacted]w[redacted]d being seen today for ongoing prenatal care.  She is currently monitored for the following issues for this low-risk pregnancy and has Esophageal reflux; Oral herpes simplex, 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; Bartholin cyst; Subchorionic hemorrhage in first trimester; [redacted] weeks gestation of pregnancy; Prenatal care, subsequent pregnancy in third trimester; and Late prenatal care complicating pregnancy in third trimester on their problem list.  Patient doing well with no acute concerns today. She reports no complaints.  Contractions: Not present. Vag. Bleeding: None.  Movement: Present. Denies leaking of fluid.   The following portions of the patient's history were reviewed and updated as appropriate: allergies, current medications, past family history, past medical history, past social history, past surgical history and problem list. Problem list updated.  Objective:   Vitals:   09/10/20 0918 09/10/20 1017  BP: (!) 149/72 123/77  Pulse: 79 83  Weight: 179 lb 9.6 oz (81.5 kg)     Fetal Status: Fetal Heart Rate (bpm): 135 Fundal Height: 28 cm Movement: Present     General:  Alert, oriented and cooperative. Patient is in no acute distress.  Skin: Skin is warm and dry. No rash noted.   Cardiovascular: Normal heart rate noted  Respiratory: Normal respiratory effort, no problems with respiration noted  Abdomen: Soft, gravid, appropriate for gestational age.  Pain/Pressure: Present     Pelvic: Cervical exam deferred        Extremities: Normal range of motion.     Mental Status:  Normal mood and affect. Normal behavior. Normal judgment and thought content.   Assessment and Plan:  Pregnancy: G3P2002 at [redacted]w[redacted]d  1. Subchorionic hemorrhage of placenta in first trimester, single or unspecified fetus   2. [redacted] weeks gestation of pregnancy Pt needs TOC for trich and chlamydia at next visit  3. Prenatal care, subsequent pregnancy  in third trimester  - [place] MFM OB DETAIL +14 WK; Future - CBC - Glucose Tolerance, 2 Hours w/1 Hour - RPR - HIV Antibody (routine testing w rflx)  4. Late prenatal care complicating pregnancy in third trimester   Preterm labor symptoms and general obstetric precautions including but not limited to vaginal bleeding, contractions, leaking of fluid and fetal movement were reviewed in detail with the patient.  Please refer to After Visit Summary for other counseling recommendations.   Return in about 2 weeks (around 09/24/2020) for ROB, in person.   Mariel Aloe, MD

## 2020-09-11 LAB — GLUCOSE TOLERANCE, 2 HOURS W/ 1HR
Glucose, 1 hour: 102 mg/dL (ref 65–179)
Glucose, 2 hour: 71 mg/dL (ref 65–152)
Glucose, Fasting: 78 mg/dL (ref 65–91)

## 2020-09-11 LAB — CBC
Hematocrit: 34.3 % (ref 34.0–46.6)
Hemoglobin: 11.6 g/dL (ref 11.1–15.9)
MCH: 30.9 pg (ref 26.6–33.0)
MCHC: 33.8 g/dL (ref 31.5–35.7)
MCV: 91 fL (ref 79–97)
Platelets: 249 10*3/uL (ref 150–450)
RBC: 3.76 x10E6/uL — ABNORMAL LOW (ref 3.77–5.28)
RDW: 12.6 % (ref 11.7–15.4)
WBC: 10.4 10*3/uL (ref 3.4–10.8)

## 2020-09-11 LAB — RPR: RPR Ser Ql: NONREACTIVE

## 2020-09-11 LAB — HIV ANTIBODY (ROUTINE TESTING W REFLEX): HIV Screen 4th Generation wRfx: NONREACTIVE

## 2020-09-19 ENCOUNTER — Encounter: Payer: Self-pay | Admitting: Obstetrics

## 2020-09-19 ENCOUNTER — Ambulatory Visit: Payer: No Typology Code available for payment source | Attending: Advanced Practice Midwife

## 2020-09-24 ENCOUNTER — Encounter: Payer: Medicaid Other | Admitting: Obstetrics and Gynecology

## 2020-09-26 ENCOUNTER — Encounter: Payer: Medicaid Other | Admitting: Obstetrics and Gynecology

## 2020-10-07 ENCOUNTER — Encounter: Payer: Medicaid Other | Admitting: Obstetrics & Gynecology

## 2020-10-15 ENCOUNTER — Encounter: Payer: Self-pay | Admitting: *Deleted

## 2020-10-15 ENCOUNTER — Other Ambulatory Visit: Payer: Self-pay

## 2020-10-15 ENCOUNTER — Ambulatory Visit: Payer: Medicaid Other | Admitting: *Deleted

## 2020-10-15 ENCOUNTER — Ambulatory Visit: Payer: Medicaid Other | Attending: Obstetrics and Gynecology

## 2020-10-15 VITALS — BP 128/61 | HR 68

## 2020-10-15 DIAGNOSIS — O0930 Supervision of pregnancy with insufficient antenatal care, unspecified trimester: Secondary | ICD-10-CM | POA: Diagnosis present

## 2020-10-15 DIAGNOSIS — Z3483 Encounter for supervision of other normal pregnancy, third trimester: Secondary | ICD-10-CM | POA: Diagnosis not present

## 2020-11-08 DIAGNOSIS — R87629 Unspecified abnormal cytological findings in specimens from vagina: Secondary | ICD-10-CM

## 2020-11-08 HISTORY — DX: Unspecified abnormal cytological findings in specimens from vagina: R87.629

## 2020-11-09 NOTE — L&D Delivery Note (Signed)
OB/GYN Faculty Practice Delivery Note  Carrie Nielsen is a 30 y.o. [phone] s/p vaginal delivery at [redacted]w[redacted]d. She was admitted for spontaneous onset of labor.   ROM: 0h 63m with light meconium stained fluid GBS Status: negative Maximum Maternal Temperature: 98.74F  Labor Progress: Pt was found to be in latent labor on admission. She quickly progressed to complete cervical dilation s/p admission to L&D. AROM for light meconium stained fluid immediately prior to delivery as noted below.  Delivery Date/Time: 12/04/20 at 0755 Delivery: Called to room and patient was complete and pushing. Head delivered ROA. Nuchal cord x1 present; reduced s/p delivery. Shoulder and body delivered in usual fashion. Infant with spontaneous cry, placed on mother's abdomen, dried and stimulated. Cord clamped x 2 after 1-minute delay, and cut by maternal grandmother under my direct supervision. Cord blood drawn. Placenta delivered spontaneously with gentle cord traction. Fundus firm with massage and Pitocin. Labia, perineum, vagina, and cervix were inspected, notable for hemostatic first degree laceration without need for repair.   Placenta: 3-vessel cord, intact, sent to L&D Complications: none Lacerations: hemostatic first degree laceration without need for repair. EBL: Analgesia: epidural  Infant: female  APGARs 9 & 9  weight 2991g  Lynnda Shields, MD OB/GYN Fellow, Faculty Practice

## 2020-11-11 ENCOUNTER — Encounter: Payer: Self-pay | Admitting: Obstetrics and Gynecology

## 2020-11-11 ENCOUNTER — Other Ambulatory Visit: Payer: Self-pay

## 2020-11-11 ENCOUNTER — Ambulatory Visit (INDEPENDENT_AMBULATORY_CARE_PROVIDER_SITE_OTHER): Payer: Medicaid Other | Admitting: Obstetrics and Gynecology

## 2020-11-11 ENCOUNTER — Other Ambulatory Visit (HOSPITAL_COMMUNITY)
Admission: RE | Admit: 2020-11-11 | Discharge: 2020-11-11 | Disposition: A | Discharge status: Home / Self Care | Payer: Medicaid Other | Source: Ambulatory Visit | Attending: Obstetrics and Gynecology | Admitting: Obstetrics and Gynecology

## 2020-11-11 DIAGNOSIS — H6122 Impacted cerumen, left ear: Secondary | ICD-10-CM | POA: Insufficient documentation

## 2020-11-11 DIAGNOSIS — Z8619 Personal history of other infectious and parasitic diseases: Secondary | ICD-10-CM | POA: Insufficient documentation

## 2020-11-11 DIAGNOSIS — Z3480 Encounter for supervision of other normal pregnancy, unspecified trimester: Secondary | ICD-10-CM | POA: Diagnosis present

## 2020-11-11 DIAGNOSIS — H6121 Impacted cerumen, right ear: Secondary | ICD-10-CM | POA: Insufficient documentation

## 2020-11-11 MED ORDER — PANTOPRAZOLE SODIUM 40 MG PO TBEC
40.0000 mg | DELAYED_RELEASE_TABLET | Freq: Every day | ORAL | 0 refills | Status: DC
Start: 2020-11-11 — End: 2020-12-06

## 2020-11-11 MED ORDER — VALACYCLOVIR HCL 500 MG PO TABS: 500.0000 mg | ORAL_TABLET | Freq: Two times a day (BID) | ORAL | 6 refills | Status: DC

## 2020-11-11 NOTE — Progress Notes (Signed)
   PRENATAL VISIT NOTE  Subjective:  Chonte Ricke is a 30 y.o. G3P2002 at [redacted]w[redacted]d being seen today for ongoing prenatal care.  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  Patient reports heartburn.  Contractions: Irregular. Vag. Bleeding: None.  Movement: Present. Denies leaking of fluid.   The following portions of the patient's history were reviewed and updated as appropriate: allergies, current medications, past family history, past medical history, past social history, past surgical history and problem list.   Objective:   Vitals:   11/11/20 1551  BP: 123/73  Pulse: 87  Weight: 203 lb (92.1 kg)    Fetal Status: Fetal Heart Rate (bpm): 128 Fundal Height: 36 cm Movement: Present  Presentation: Vertex  General:  Alert, oriented and cooperative. Patient is in no acute distress.  Skin: Skin is warm and dry. No rash noted.   Cardiovascular: Normal heart rate noted  Respiratory: Normal respiratory effort, no problems with respiration noted  Abdomen: Soft, gravid, appropriate for gestational age.  Pain/Pressure: Present     Pelvic: Cervical exam performed in the presence of a chaperone Dilation: 1 Effacement (%): Thick Station: Ballotable  Extremities: Normal range of motion.  Edema: Trace  Mental Status: Normal mood and affect. Normal behavior. Normal judgment and thought content.   Assessment and Plan:  Pregnancy: G3P2002 at [redacted]w[redacted]d 1. Supervision of other normal pregnancy, antepartum Patient is doing well Cultures collected Patient plans COC Rx protonix provided  2. History of herpes genitalis Rx valtrex provided  Term labor symptoms and general obstetric  precautions including but not limited to vaginal bleeding, contractions, leaking of fluid and fetal movement were reviewed in detail with the patient. Please refer to After Visit Summary for other counseling recommendations.   Return in about 1 week (around 11/18/2020) for in person, ROB, Low risk.  No future appointments.  Catalina Antigua, MD

## 2020-11-11 NOTE — Progress Notes (Signed)
Pt reports fetal movement with occasional pain.

## 2020-11-13 LAB — CERVICOVAGINAL ANCILLARY ONLY
Bacterial Vaginitis (gardnerella): NEGATIVE
Candida Glabrata: NEGATIVE
Candida Vaginitis: POSITIVE — AB
Chlamydia: NEGATIVE
Comment: NEGATIVE
Comment: NEGATIVE
Comment: NEGATIVE
Comment: NEGATIVE
Comment: NEGATIVE
Comment: NORMAL
Neisseria Gonorrhea: NEGATIVE
Trichomonas: NEGATIVE

## 2020-11-13 LAB — STREP GP B NAA: Strep Gp B NAA: NEGATIVE

## 2020-11-14 ENCOUNTER — Encounter: Payer: Self-pay | Admitting: Obstetrics

## 2020-11-14 ENCOUNTER — Other Ambulatory Visit: Payer: Self-pay | Admitting: Obstetrics and Gynecology

## 2020-11-14 DIAGNOSIS — B379 Candidiasis, unspecified: Secondary | ICD-10-CM

## 2020-11-14 MED ORDER — TERCONAZOLE 0.4 % VA CREA: 1.0000 | TOPICAL_CREAM | Freq: Every day | VAGINAL | 0 refills | Status: DC

## 2020-11-17 IMAGING — CR DG FOOT COMPLETE 3+V*L*
3 series · 3 of 3 positions shown · non-contrast
Comparison: None.

CLINICAL DATA: Left foot pain after fall last night.

EXAM:
LEFT FOOT - COMPLETE 3+ VIEW

[x foot ap left]
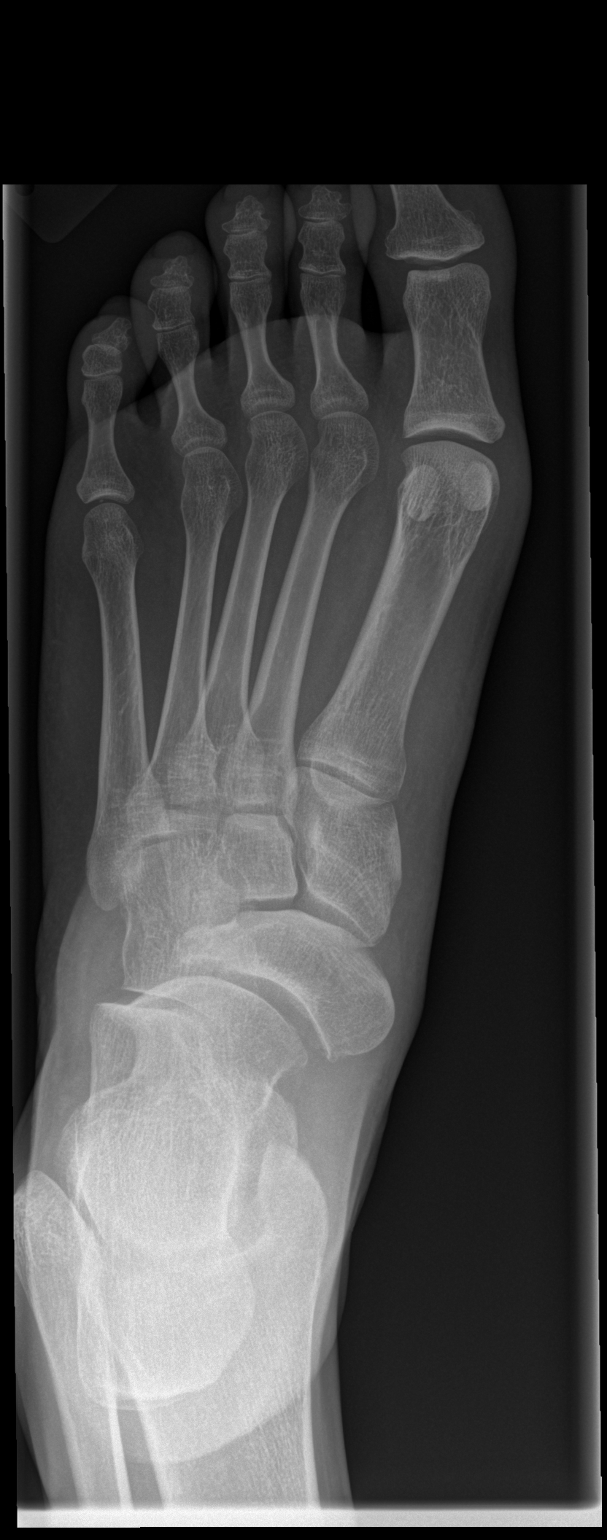

[x foot obl left]
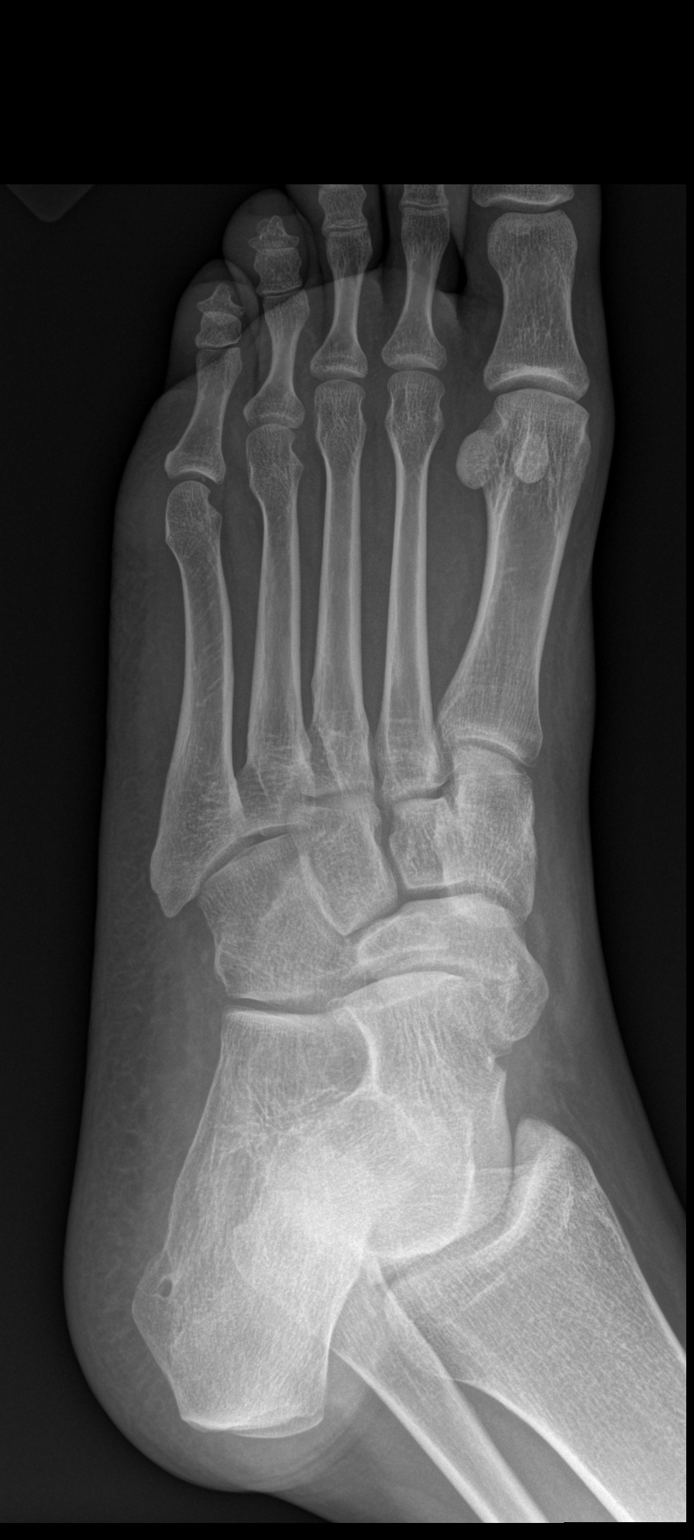

[x foot lat left]
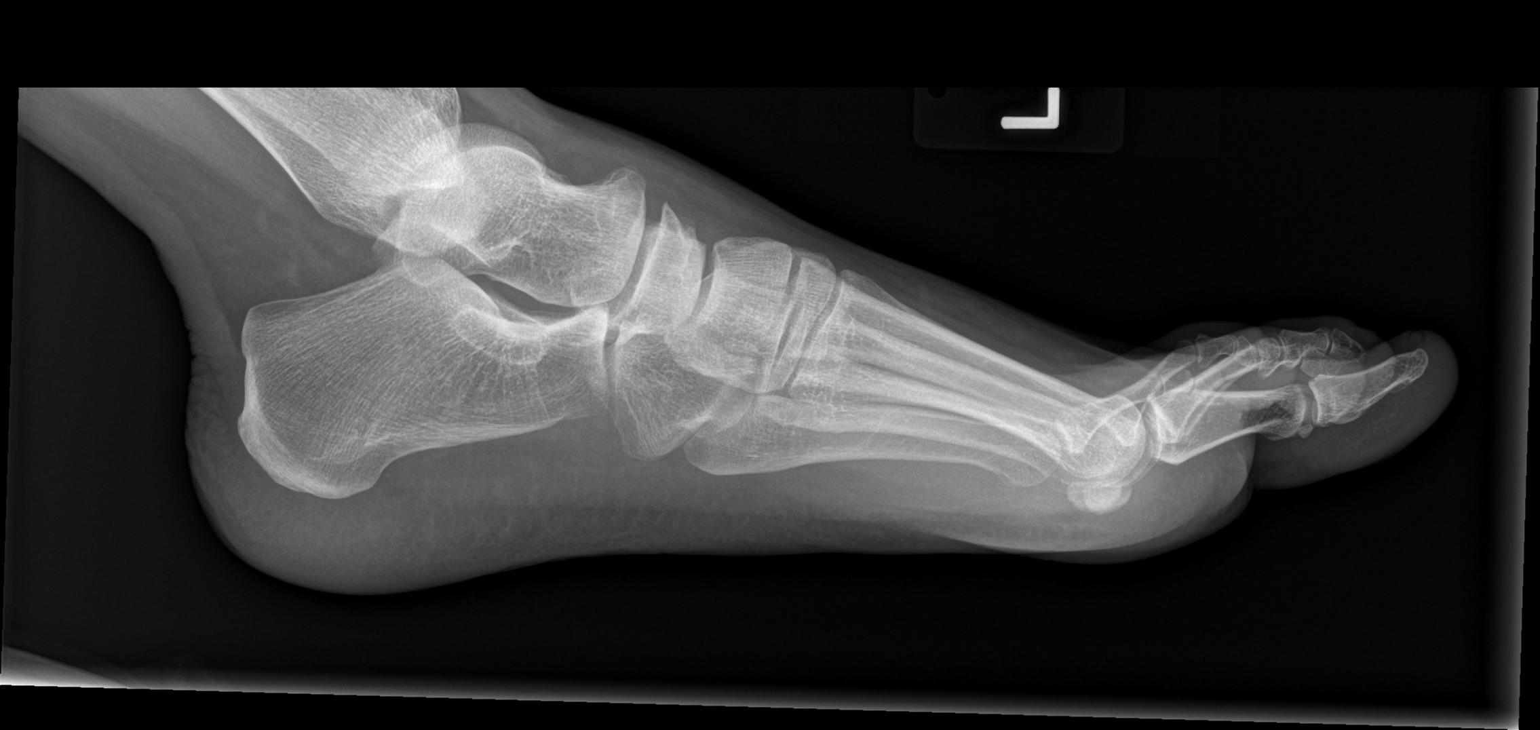

[3 of 3 positions shown; findings below may reference images not displayed]

FINDINGS: There is no evidence of fracture or dislocation. There is no
evidence of arthropathy or other focal bone abnormality. Soft
tissues are unremarkable.
IMPRESSION: Negative.

## 2020-11-20 ENCOUNTER — Other Ambulatory Visit: Payer: Self-pay

## 2020-11-20 ENCOUNTER — Ambulatory Visit (INDEPENDENT_AMBULATORY_CARE_PROVIDER_SITE_OTHER): Payer: Medicaid Other | Admitting: Women's Health

## 2020-11-20 VITALS — BP 130/84 | HR 92 | Wt 206.0 lb

## 2020-11-20 DIAGNOSIS — Z3480 Encounter for supervision of other normal pregnancy, unspecified trimester: Secondary | ICD-10-CM

## 2020-11-20 DIAGNOSIS — R87610 Atypical squamous cells of undetermined significance on cytologic smear of cervix (ASC-US): Secondary | ICD-10-CM

## 2020-11-20 DIAGNOSIS — R87810 Cervical high risk human papillomavirus (HPV) DNA test positive: Secondary | ICD-10-CM

## 2020-11-20 DIAGNOSIS — Z8619 Personal history of other infectious and parasitic diseases: Secondary | ICD-10-CM

## 2020-11-20 NOTE — Progress Notes (Addendum)
Subjective:  Carrie Nielsen is a 30 y.o. G3P2002 at [redacted]w[redacted]d being seen today for ongoing prenatal care.  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  Patient reports no complaints.  Contractions: Not present. Vag. Bleeding: None.  Movement: Present. Denies leaking of fluid.   The following portions of the patient's history were reviewed and updated as appropriate: allergies, current medications, past family history, past medical history, past social history, past surgical history and problem list. Problem list updated.  Objective:   Vitals:   11/20/20 1621  BP: 130/84  Pulse: 92  Weight: 206 lb (93.4 kg)    Fetal Status: Fetal Heart Rate (bpm): 150 Fundal Height: 37 cm Movement: Present     General:  Alert, oriented and cooperative. Patient is in no acute distress.  Skin: Skin is warm and dry. No rash noted.   Cardiovascular: Normal heart rate noted  Respiratory: Normal respiratory effort, no problems with respiration noted  Abdomen: Soft, gravid, appropriate for gestational age. Pain/Pressure: Present     Pelvic: Vag. Bleeding: None     Cervical exam deferred        Extremities: Normal range of motion.  Edema: None  Mental Status: Normal mood and affect. Normal behavior. Normal judgment and thought content.   Urinalysis:      Assessment and Plan:  Pregnancy: G3P2002 at [redacted]w[redacted]d  1. Supervision of other normal pregnancy, antepartum -IOL scheduled today for 12/06/2020, orders entered -Reviewed labor readiness with patient including the [employer], evening primrose oil, and raspberry leaf tea.   2. History of herpes  genitalis -pt has not yet started Valtrex sent at last visit, pt reports she usually only takes medication after outbreaks have started, pt informed this is to prevent outbreaks as she will need a C/S if active genital infection at time of delivery, pt reports has only had oral outbreaks in past  3. ASCUS with positive high risk HPV cervical -needs colpo ppartum, pt aware  Term labor symptoms and general obstetric precautions including but not limited to vaginal bleeding, contractions, leaking of fluid and fetal movement were reviewed in detail with the patient. I discussed the assessment and treatment plan with the patient. The patient was provided an opportunity to ask questions and all were answered. The patient agreed with the plan and demonstrated an understanding of the instructions. The patient was advised to call back or seek an in-person office evaluation/go to [facility] at [facility] for any urgent or concerning symptoms. Please refer to After Visit Summary for other counseling recommendations.  Return in 9 days (on 11/29/2020) for NST/OB.   Neilson Oehlert, Odie Sera, NP

## 2020-11-20 NOTE — Patient Instructions (Addendum)
Maternity Assessment Unit (MAU)  The Maternity Assessment Unit (MAU) is located at the [facility] at [facility]. The address is: [address], [place], [place], [place] [address]. Please see map below for additional directions.    The Maternity Assessment Unit is designed to help you during your pregnancy, and for up to 6 weeks after delivery, with any pregnancy- or postpartum-related emergencies, if you think you are in labor, or if your water has broken. For example, if you experience nausea and vomiting, vaginal bleeding, severe abdominal or pelvic pain, elevated blood pressure or other problems related to your pregnancy or postpartum time, please come to the Maternity Assessment Unit for assistance.        [email or web address]">  Third Trimester of Pregnancy  The third trimester of pregnancy is from week 28 through week 40. This is months 7 through 9. The third trimester is a time when the unborn baby (fetus) is growing rapidly. At the end of the ninth month, the fetus is about 20 inches long and weighs 6-10 pounds. Body changes during your third trimester During the third trimester, your body will continue to go through many changes. The changes vary and generally return to normal after your baby is born. Physical changes  Your weight will continue to increase. You can expect to gain 25-35 pounds (11-16 kg) by the end of the pregnancy if you begin pregnancy at a normal weight. If you are underweight, you can expect to gain 28-40 lb (about 13-18 kg), and if you are overweight, you can expect to gain 15-25 lb (about 7-11 kg).  You may begin to get stretch marks on your hips, abdomen, and breasts.  Your breasts will continue to grow and may hurt. A yellow fluid (colostrum) may leak from your breasts. This is the first milk you are producing for your baby.  You may have changes in your hair. These  can include thickening of your hair, rapid growth, and changes in texture. Some people also have hair loss during or after pregnancy, or hair that feels dry or thin.  Your belly button may stick out.  You may notice more swelling in your hands, face, or ankles. Health changes  You may have heartburn.  You may have constipation.  You may develop hemorrhoids.  You may develop swollen, bulging veins (varicose veins) in your legs.  You may have increased body aches in the pelvis, back, or thighs. This is due to weight gain and increased hormones that are relaxing your joints.  You may have increased tingling or numbness in your hands, arms, and legs. The skin on your abdomen may also feel numb.  You may feel short of breath because of your expanding uterus. Other changes  You may urinate more often because the fetus is moving lower into your pelvis and pressing on your bladder.  You may have more problems sleeping. This may be caused by the size of your abdomen, an increased need to urinate, and an increase in your body's metabolism.  You may notice the fetus "dropping," or moving lower in your abdomen (lightening).  You may have increased vaginal discharge.  You may notice that you have pain around your pelvic bone as your uterus distends. Follow these instructions at home: Medicines  Follow your health care provider's instructions regarding medicine use. Specific medicines may be either safe or unsafe to take during pregnancy. Do not take any medicines unless approved by your health care provider.  Take  a prenatal vitamin that contains at least 600 micrograms (mcg) of folic acid. Eating and drinking  Eat a healthy diet that includes fresh fruits and vegetables, whole grains, good sources of protein such as meat, eggs, or tofu, and low-fat dairy products.  Avoid raw meat and unpasteurized juice, milk, and cheese. These carry germs that can harm you and your baby.  Eat 4 or 5  small meals rather than 3 large meals a day.  You may need to take these actions to prevent or treat constipation: ? Drink enough fluid to keep your urine pale yellow. ? Eat foods that are high in fiber, such as beans, whole grains, and fresh fruits and vegetables. ? Limit foods that are high in fat and processed sugars, such as fried or sweet foods. Activity  Exercise only as directed by your health care provider. Most people can continue their usual exercise routine during pregnancy. Try to exercise for 30 minutes at least 5 days a week. Stop exercising if you experience contractions in the uterus.  Stop exercising if you develop pain or cramping in the lower abdomen or lower back.  Avoid heavy lifting.  Do not exercise if it is very hot or humid or if you are at a high altitude.  If you choose to, you may continue to have sex unless your health care provider tells you not to. Relieving pain and discomfort  Take frequent breaks and rest with your legs raised (elevated) if you have leg cramps or low back pain.  Take warm sitz baths to soothe any pain or discomfort caused by hemorrhoids. Use hemorrhoid cream if your health care provider approves.  Wear a supportive bra to prevent discomfort from breast tenderness.  If you develop varicose veins: ? Wear support hose as told by your health care provider. ? Elevate your feet for 15 minutes, 3-4 times a day. ? Limit salt in your diet. Safety  Talk to your health care provider before traveling far distances.  Do not use hot tubs, steam rooms, or saunas.  Wear your seat belt at all times when driving or riding in a car.  Talk with your health care provider if someone is verbally or physically abusive to you. Preparing for birth To prepare for the arrival of your baby:  Take prenatal classes to understand, practice, and ask questions about labor and delivery.  Visit the hospital and tour the maternity area.  Purchase a  rear-facing car seat and make sure you know how to install it in your car.  Prepare the baby's room or sleeping area. Make sure to remove all pillows and stuffed animals from the baby's crib to prevent suffocation. General instructions  Avoid cat litter boxes and soil used by cats. These carry germs that can cause birth defects in the baby. If you have a cat, ask someone to clean the litter box for you.  Do not douche or use tampons. Do not use scented sanitary pads.  Do not use any products that contain nicotine or tobacco, such as cigarettes, e-cigarettes, and chewing tobacco. If you need help quitting, ask your health care provider.  Do not use any herbal remedies, illegal drugs, or medicines that were not prescribed to you. Chemicals in these products can harm your baby.  Do not drink alcohol.  You will have more frequent prenatal exams during the third trimester. During a routine prenatal visit, your health care provider will do a physical exam, perform tests, and discuss your overall health.  Keep all follow-up visits. This is important. Where to find more information  American Pregnancy Association: americanpregnancy.org  [employer]necologists: https://www.todd-brady.net/  Office on [employer] Health: MightyReward.co.nz Contact a health care provider if you have:  A fever.  Mild pelvic cramps, pelvic pressure, or nagging pain in your abdominal area or lower back.  Vomiting or diarrhea.  Bad-smelling vaginal discharge or foul-smelling urine.  Pain when you urinate.  A headache that does not go away when you take medicine.  Visual changes or see spots in front of your eyes. Get help right away if:  Your water breaks.  You have regular contractions less than 5 minutes apart.  You have spotting or bleeding from your vagina.  You have severe abdominal pain.  You have difficulty breathing.  You have chest pain.  You have  fainting spells.  You have not felt your baby move for the time period told by your health care provider.  You have new or increased pain, swelling, or redness in an arm or leg. Summary  The third trimester of pregnancy is from week 28 through week 40 (months 7 through 9).  You may have more problems sleeping. This can be caused by the size of your abdomen, an increased need to urinate, and an increase in your body's metabolism.  You will have more frequent prenatal exams during the third trimester. Keep all follow-up visits. This is important. This information is not intended to replace advice given to you by your health care provider. Make sure you discuss any questions you have with your health care provider. Document Revised: 04/03/2020 Document Reviewed: 02/08/2020 Elsevier Patient Education  2021 Elsevier Inc.        Signs and Symptoms of Labor Labor is the body's natural process of moving the baby and the placenta out of the uterus. The process of labor usually starts when the baby is full-term, between 2 and 40 weeks of pregnancy. Signs and symptoms that you are close to going into labor As your body prepares for labor and the birth of your baby, you may notice the following symptoms in the weeks and days before true labor starts:  Passing a small amount of thick, bloody mucus from your vagina. This is called normal bloody show or losing your mucus plug. This may happen more than a week before labor begins, or right before labor begins, as the opening of the cervix starts to widen (dilate). For some women, the entire mucus plug passes at once. For others, pieces of the mucus plug may gradually pass over several days.  Your baby moving (dropping) lower in your pelvis to get into position for birth (lightening). When this happens, you may feel more pressure on your bladder and pelvic bone and less pressure on your ribs. This may make it easier to breathe. It may also cause you to  need to urinate more often and have problems with bowel movements.  Having "practice contractions," also called Braxton Hicks contractions or false labor. These occur at irregular (unevenly spaced) intervals that are more than 10 minutes apart. False labor contractions are common after exercise or sexual activity. They will stop if you change position, rest, or drink fluids. These contractions are usually mild and do not get stronger over time. They may feel like: ? A backache or back pain. ? Mild cramps, similar to menstrual cramps. ? Tightening or pressure in your abdomen. Other early symptoms include:  Nausea or loss of appetite.  Diarrhea.  Having a sudden burst of energy, or feeling very tired.  Mood changes.  Having trouble sleeping.   Signs and symptoms that labor has begun Signs that you are in labor may include:  Having contractions that come at regular (evenly spaced) intervals and increase in intensity. This may feel like more intense tightening or pressure in your abdomen that moves to your back. ? Contractions may also feel like rhythmic pain in your upper thighs or back that comes and goes at regular intervals. ? For first-time mothers, this change in intensity of contractions often occurs at a more gradual pace. ? Women who have given birth before may notice a more rapid progression of contraction changes.  Feeling pressure in the vaginal area.  Your water breaking (rupture of membranes). This is when the sac of fluid that surrounds your baby breaks. Fluid leaking from your vagina may be clear or blood-tinged. Labor usually starts within 24 hours of your water breaking, but it may take longer to begin. ? Some women may feel a sudden gush of fluid. ? Others notice that their underwear repeatedly becomes damp. Follow these instructions at home:  When labor starts, or if your water breaks, call your health care provider or nurse care line. Based on your situation, they  will determine when you should go in for an exam.  During early labor, you may be able to rest and manage symptoms at home. Some strategies to try at home include: ? Breathing and relaxation techniques. ? Taking a warm bath or shower. ? Listening to music. ? Using a heating pad on the lower back for pain. If you are directed to use heat:  Place a towel between your skin and the heat source.  Leave the heat on for 20-30 minutes.  Remove the heat if your skin turns bright red. This is especially important if you are unable to feel pain, heat, or cold. You may have a greater risk of getting burned.   Contact a health care provider if:  Your labor has started.  Your water breaks. Get help right away if:  You have painful, regular contractions that are 5 minutes apart or less.  Labor starts before you are [redacted] weeks along in your pregnancy.  You have a fever.  You have bright red blood coming from your vagina.  You do not feel your baby moving.  You have a severe headache with or without vision problems.  You have severe nausea, vomiting, or diarrhea.  You have chest pain or shortness of breath. These symptoms may represent a serious problem that is an emergency. Do not wait to see if the symptoms will go away. Get medical help right away. Call your local emergency services (911 in the [place] Do not drive yourself to the hospital. Summary  Labor is your body's natural process of moving your baby and the placenta out of your uterus.  The process of labor usually starts when your baby is full-term, between 42 and 40 weeks of pregnancy.  When labor starts, or if your water breaks, call your health care provider or nurse care line. Based on your situation, they will determine when you should go in for an exam. This information is not intended to replace advice given to you by your health care provider. Make sure you discuss any questions you have with your health care  provider. Document Revised: 08/17/2020 Document Reviewed: 08/17/2020 Elsevier Patient Education  2021 [employer].       Things  to Try After 37 weeks to Encourage Labor/Get Ready for Labor:   1.  Try the [employer] at https://glass.com/.com daily to improve baby's position and encourage the onset of labor.  2. Walk a little and rest a little every day.  Change positions often.  3. Cervical Ripening: May try one or both a. Red Raspberry Leaf capsules or tea:  two 300mg  or 400mg  tablets with each meal, 2-3 times a day, or 1-3 cups of tea daily  Potential Side Effects Of Raspberry Leaf:  Most women do not experience any side effects from drinking raspberry leaf tea. However, nausea and loose stools are possible   b. Evening Primrose Oil capsules: take 1 capsule by mouth and place one capsule in the vagina every night.    Some of the potential side effects:  Upset stomach  Loose stools or diarrhea  Headaches  Nausea  4. Sex can also help the cervix ripen and encourage labor onset.        New Induction of Labor Process for and [facility]  In Fall 2020 [facility] changed it's process for scheduling inductions of labor to create more induction slots and to make sure patients get COVID-19 testing in advance. After you have been tested you need to quarantine so that you do not get infected after your test. You should not go anywhere after your test except necessary medical appointments.  You have been scheduled for induction of labor on 12/06/2020. Although you may have a specific time listed on your After Visit Summary or MyChart, we cannot predict when your room will be available. Please disregard this time. A Labor and Delivery staff member will call you on the day that you are scheduled when your room is available. You will need to arrive within one hour of being called. If you do not arrive within this time frame, the  next person on the list will be called in and you will move down the list. You may eat a light meal before coming to the hospital. If you go into labor, think your water has broken, experience bright red bleeding or don't feel your baby moving as much as usual before your induction, please call your Ob/Gyn's office or come to Entrance C, Maternity Assessment Unit for evaluation.  Thank you,  [facility]         [email or web address]">  Colposcopy  Colposcopy is a procedure to examine the lowest part of the uterus (cervix), the vagina, and the area around the vaginal opening (vulva) for abnormalities or signs of disease. This procedure is done using an instrument that makes objects appear larger and provides light. (colposcope). During the procedure, the health care provider may remove a tissue sample to be looked at later under a microscope (biopsy). A biopsy may be done if any unusual cells are found during the colposcopy. You may have a colposcopy if you have:  An abnormal Pap smear, also called a Pap test. This screening test is used to check for signs of cancer or infection of the vagina, cervix, and uterus.  An HPV (human papillomavirus) test and get a positive result for a type of HPV that puts you at high risk of cancer.  Certain conditions or symptoms, such as: ? A sore, or lesion, on your cervix. ? Genital warts on your vulva, vagina, or cervix. ? Pain during sex. ? Vaginal bleeding, especially after sex.  A growth on your cervix (  cervical polyp) that needs to be removed. Let your health care provider know about:  Any allergies you have, including allergies to medicines, latex, or iodine.  All medicines you are taking, including vitamins, herbs, eye drops, creams, and over-the-counter medicines.  Any problems you or family members have had with anesthetic medicines.  Any blood  disorders you have.  Any surgeries you have had.  Any medical conditions you have, such as pelvic inflammatory disease (PID) or endometrial disorder.  The pattern of your menstrual cycles and the form of birth control (contraception) you use, if any.  Your medical history, including any history of fainting often or of cervical treatment.  Whether you are pregnant or may be pregnant. What are the risks? Generally, this is a safe procedure. However, problems may occur, including:  Infection. Symptoms of infection may include fever, bad-smelling vaginal discharge, or pelvic pain.  Allergic reactions to medicines.  Damage to nearby structures or organs.  Fainting. This is rare. What happens before the procedure? Eating and drinking restrictions  Follow instructions from your health care provider about eating or drinking restrictions.  You will likely need to eat a regular diet the day of the procedure and not skip any meals. Tests  You may have an exam or testing. A pregnancy test will be done the day of the procedure.  You may have a blood or urine sample taken. General instructions  Ask your health care provider about: ? Changing or stopping your regular medicines. This is especially important if you are taking diabetes medicines or blood thinners. ? Taking medicines such as aspirin and ibuprofen. These medicines can thin your blood. Do not take these medicines unless your health care provider tells you to take them. Your health care provider will likely tell you to avoid taking aspirin, or medicine that contains aspirin, for 7 days before the procedure. ? Taking over-the-counter medicines, vitamins, herbs, and supplements.  Tell your health care provider if you have your menstrual period now or will have it at the time of your procedure. A colposcopy is not normally done during your menstrual period.  If you use contraception, continue to use it before your procedure.  For  24 hours before the procedure: ? Do not use douche products or tampons. ? Do not use medicines, creams, or suppositories in the vagina. ? Do not have sex.  Ask your health care provider what steps will be taken to prevent infection. What happens during the procedure?  You will lie down on your back, with your feet in foot rests (stirrups).  A tool called a speculum will be warmed and will have oil or gel put on it (will be lubricated). The speculum will then be inserted into your vagina. This will be used to hold apart the walls of your vagina so your health care provider can see your cervix and the inside of your vagina.  A cotton swab will be used to place a small amount of a liquid (solution) on the areas to be examined. This solution makes it easier to see abnormal cells. You may feel a slight burning during this part.  The colposcope will be used to scan the cervix with a bright white light. The colposcope will be held near your vulva and will make your vulva, vagina, and cervix look bigger so they can be seen better.  If a biopsy is needed: ? You may be given a medicine to numb the area (local anesthetic). ? Surgical tools will be  used to remove mucus and cells through your vagina. ? You may feel mild pain while the tissue sample is removed. ? Bleeding may occur. A solution may be used to stop the bleeding. ? If a biopsy is needed from the inside of the cervix, a different procedure called endocervical curettage (ECC) may be done. During this procedure, a curved tool called a curette will be used to scrape cells from your cervix or the top of your cervix (endocervix).  Any abnormalities that are found will be recorded. The procedure may vary among health care providers and hospitals. What happens after the procedure?  You will lie down and rest for a few minutes. You may be offered juice or cookies.  Your blood pressure, heart rate, breathing rate, and blood oxygen level will be  monitored until you leave the hospital or clinic.  You may have some cramping in your abdomen. This should go away after a few minutes.  It is up to you to get the results of your procedure. Ask your health care provider, or the department that is doing the procedure, when your results will be ready. Summary  Colposcopy is a procedure to examine the lowest part of the uterus (cervix), the vagina, and the area around the vaginal opening (vulva) for abnormalities or signs of disease.  A biopsy may be done as part of the procedure.  After the procedure, you will remain lying down and will rest for a few minutes.  You may have some cramping in your abdomen. This should go away after a few minutes. This information is not intended to replace advice given to you by your health care provider. Make sure you discuss any questions you have with your health care provider. Document Revised: 10/25/2019 Document Reviewed: 10/25/2019 Elsevier Patient Education  2021 [employer]

## 2020-11-26 ENCOUNTER — Telehealth (HOSPITAL_COMMUNITY): Payer: Self-pay | Admitting: *Deleted

## 2020-11-26 NOTE — Telephone Encounter (Signed)
Preadmission screen  

## 2020-11-27 ENCOUNTER — Encounter (HOSPITAL_COMMUNITY): Payer: Self-pay | Admitting: *Deleted

## 2020-11-27 ENCOUNTER — Telehealth (HOSPITAL_COMMUNITY): Payer: Self-pay | Admitting: *Deleted

## 2020-11-27 NOTE — Telephone Encounter (Signed)
Preadmission screen  

## 2020-11-28 ENCOUNTER — Other Ambulatory Visit: Payer: Self-pay

## 2020-11-28 ENCOUNTER — Ambulatory Visit (INDEPENDENT_AMBULATORY_CARE_PROVIDER_SITE_OTHER): Payer: Medicaid Other | Admitting: Obstetrics and Gynecology

## 2020-11-28 ENCOUNTER — Encounter: Payer: Self-pay | Admitting: Obstetrics and Gynecology

## 2020-11-28 VITALS — BP 125/85 | HR 79 | Wt 213.0 lb

## 2020-11-28 DIAGNOSIS — Z8619 Personal history of other infectious and parasitic diseases: Secondary | ICD-10-CM

## 2020-11-28 DIAGNOSIS — Z3480 Encounter for supervision of other normal pregnancy, unspecified trimester: Secondary | ICD-10-CM | POA: Diagnosis not present

## 2020-11-28 NOTE — Progress Notes (Signed)
   PRENATAL VISIT NOTE  Subjective:  Carrie Nielsen is a 30 y.o. G3P2002 at [redacted]w[redacted]d being seen today for ongoing prenatal care.  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  Patient reports no complaints.  Contractions: Not present. Vag. Bleeding: None.  Movement: Present. Denies leaking of fluid.   The following portions of the patient's history were reviewed and updated as appropriate: allergies, current medications, past family history, past medical history, past social history, past surgical history and problem list.   Objective:   Vitals:   11/28/20 1353  BP: 125/85  Pulse: 79  Weight: 213 lb (96.6 kg)    Fetal Status: Fetal Heart Rate (bpm): 140 Fundal Height: 39 cm Movement: Present  Presentation: Vertex  General:  Alert, oriented and cooperative. Patient is in no acute distress.  Skin: Skin is warm and dry. No rash noted.   Cardiovascular: Normal heart rate noted  Respiratory: Normal respiratory effort, no problems with respiration noted  Abdomen: Soft, gravid, appropriate for gestational age.  Pain/Pressure: Present     Pelvic: Cervical exam performed in the presence of a chaperone Dilation: 1.5 Effacement (%): Thick Station: -3  Extremities: Normal range of motion.     Mental Status: Normal mood and affect. Normal behavior. Normal judgment and thought content.   Assessment and Plan:  Pregnancy: G3P2002 at [redacted]w[redacted]d 1. Supervision of other normal pregnancy, antepartum Patient is doing well without complaints Postdate fetal testing ordered IOL schedule previously - US MFM FETAL BPP WO NON STRESS; Future  2. History of  herpes genitalis Continue valtrex  Term labor symptoms and general obstetric precautions including but not limited to vaginal bleeding, contractions, leaking of fluid and fetal movement were reviewed in detail with the patient. Please refer to After Visit Summary for other counseling recommendations.   No follow-ups on file.  Future Appointments  Date Time Provider Department Center  12/04/2020  9:45 AM [facility]-SCREENING [facility] None  12/06/2020  6:30 AM MC-LD SCHED ROOM [facility] None    Catalina Antigua, MD

## 2020-11-29 ENCOUNTER — Encounter: Payer: Medicaid Other | Admitting: Obstetrics & Gynecology

## 2020-12-03 ENCOUNTER — Other Ambulatory Visit: Payer: Self-pay | Admitting: Advanced Practice Midwife

## 2020-12-04 ENCOUNTER — Inpatient Hospital Stay
Admission: AD | Admit: 2020-12-04 | Payer: Medicaid Other | Source: Home / Self Care | Admitting: Obstetrics and Gynecology

## 2020-12-04 ENCOUNTER — Other Ambulatory Visit (HOSPITAL_COMMUNITY): Payer: Medicaid Other | Attending: Obstetrics and Gynecology

## 2020-12-04 ENCOUNTER — Inpatient Hospital Stay (HOSPITAL_COMMUNITY): Payer: Medicaid Other | Admitting: Anesthesiology

## 2020-12-04 ENCOUNTER — Inpatient Hospital Stay (HOSPITAL_COMMUNITY)
Admission: AD | Admit: 2020-12-04 | Discharge: 2020-12-06 | DRG: 807 | Disposition: A | Discharge status: Home / Self Care | Payer: Medicaid Other | Attending: Obstetrics and Gynecology | Admitting: Obstetrics and Gynecology

## 2020-12-04 ENCOUNTER — Other Ambulatory Visit: Payer: Self-pay

## 2020-12-04 ENCOUNTER — Encounter (HOSPITAL_COMMUNITY): Payer: Self-pay | Admitting: Obstetrics & Gynecology

## 2020-12-04 DIAGNOSIS — Z3A40 40 weeks gestation of pregnancy: Secondary | ICD-10-CM

## 2020-12-04 DIAGNOSIS — R87810 Cervical high risk human papillomavirus (HPV) DNA test positive: Secondary | ICD-10-CM

## 2020-12-04 DIAGNOSIS — O480 Post-term pregnancy: Principal | ICD-10-CM | POA: Diagnosis present

## 2020-12-04 DIAGNOSIS — Z3480 Encounter for supervision of other normal pregnancy, unspecified trimester: Secondary | ICD-10-CM

## 2020-12-04 DIAGNOSIS — Z20822 Contact with and (suspected) exposure to covid-19: Secondary | ICD-10-CM | POA: Diagnosis present

## 2020-12-04 DIAGNOSIS — Z8619 Personal history of other infectious and parasitic diseases: Secondary | ICD-10-CM

## 2020-12-04 DIAGNOSIS — R87610 Atypical squamous cells of undetermined significance on cytologic smear of cervix (ASC-US): Secondary | ICD-10-CM

## 2020-12-04 DIAGNOSIS — O418X10 Other specified disorders of amniotic fluid and membranes, first trimester, not applicable or unspecified: Secondary | ICD-10-CM

## 2020-12-04 DIAGNOSIS — O26893 Other specified pregnancy related conditions, third trimester: Secondary | ICD-10-CM | POA: Diagnosis present

## 2020-12-04 LAB — CBC
HCT: 35.5 % — ABNORMAL LOW (ref 36.0–46.0)
Hemoglobin: 12.2 g/dL (ref 12.0–15.0)
MCH: 30.7 pg (ref 26.0–34.0)
MCHC: 34.4 g/dL (ref 30.0–36.0)
MCV: 89.4 fL (ref 80.0–100.0)
Platelets: 277 10*3/uL (ref 150–400)
RBC: 3.97 MIL/uL (ref 3.87–5.11)
RDW: 13.8 % (ref 11.5–15.5)
WBC: 13.7 10*3/uL — ABNORMAL HIGH (ref 4.0–10.5)
nRBC: 0 % (ref 0.0–0.2)

## 2020-12-04 LAB — RPR: RPR Ser Ql: NONREACTIVE

## 2020-12-04 LAB — TYPE AND SCREEN
ABO/RH(D): O POS
Antibody Screen: NEGATIVE

## 2020-12-04 LAB — SARS CORONAVIRUS 2 BY RT PCR (HOSPITAL ORDER, PERFORMED IN ~~LOC~~ HOSPITAL LAB): SARS Coronavirus 2: NEGATIVE

## 2020-12-04 MED ORDER — EPHEDRINE 5 MG/ML INJ: 10.0000 mg | INTRAVENOUS | Status: DC | PRN

## 2020-12-04 MED ORDER — SENNOSIDES-DOCUSATE SODIUM 8.6-50 MG PO TABS
2.0000 | ORAL_TABLET | Freq: Every day | ORAL | Status: DC
  Administered 2020-12-05: 2 via ORAL
  Filled 2020-12-04 (×2): qty 2

## 2020-12-04 MED ORDER — LACTATED RINGERS IV SOLN: INTRAVENOUS | Status: DC

## 2020-12-04 MED ORDER — PHENYLEPHRINE 40 MCG/ML (10ML) SYRINGE FOR IV PUSH (FOR BLOOD PRESSURE SUPPORT): 80.0000 ug | PREFILLED_SYRINGE | INTRAVENOUS | Status: DC | PRN

## 2020-12-04 MED ORDER — OXYTOCIN-SODIUM CHLORIDE 30-0.9 UT/500ML-% IV SOLN
2.5000 [IU]/h | INTRAVENOUS | Status: DC
  Filled 2020-12-04: qty 500

## 2020-12-04 MED ORDER — LIDOCAINE HCL (PF) 1 % IJ SOLN: 30.0000 mL | INTRAMUSCULAR | Status: DC | PRN

## 2020-12-04 MED ORDER — PRENATAL MULTIVITAMIN CH
1.0000 | ORAL_TABLET | Freq: Every day | ORAL | Status: DC
  Administered 2020-12-04 – 2020-12-06 (×3): 1 via ORAL
  Filled 2020-12-04 (×3): qty 1

## 2020-12-04 MED ORDER — IBUPROFEN 600 MG PO TABS
600.0000 mg | ORAL_TABLET | Freq: Four times a day (QID) | ORAL | Status: DC
  Administered 2020-12-04 – 2020-12-06 (×9): 600 mg via ORAL
  Filled 2020-12-04 (×9): qty 1

## 2020-12-04 MED ORDER — COCONUT OIL OIL: 1.0000 "application " | TOPICAL_OIL | Status: DC | PRN

## 2020-12-04 MED ORDER — MISOPROSTOL 25 MCG QUARTER TABLET: 25.0000 ug | ORAL_TABLET | ORAL | Status: DC | PRN

## 2020-12-04 MED ORDER — BENZOCAINE-MENTHOL 20-0.5 % EX AERO: 1.0000 "application " | INHALATION_SPRAY | CUTANEOUS | Status: DC | PRN

## 2020-12-04 MED ORDER — LACTATED RINGERS IV SOLN
500.0000 mL | Freq: Once | INTRAVENOUS | Status: AC
  Administered 2020-12-04: 500 mL via INTRAVENOUS

## 2020-12-04 MED ORDER — OXYCODONE-ACETAMINOPHEN 5-325 MG PO TABS: 1.0000 | ORAL_TABLET | ORAL | Status: DC | PRN

## 2020-12-04 MED ORDER — FENTANYL-BUPIVACAINE-NACL 0.5-0.125-0.9 MG/250ML-% EP SOLN
12.0000 mL/h | EPIDURAL | Status: DC | PRN
  Administered 2020-12-04: 12.5 mL/h via EPIDURAL
  Filled 2020-12-04: qty 250

## 2020-12-04 MED ORDER — ONDANSETRON HCL 4 MG PO TABS: 4.0000 mg | ORAL_TABLET | ORAL | Status: DC | PRN

## 2020-12-04 MED ORDER — ACETAMINOPHEN 325 MG PO TABS
650.0000 mg | ORAL_TABLET | Freq: Four times a day (QID) | ORAL | Status: DC
  Administered 2020-12-04 – 2020-12-06 (×9): 650 mg via ORAL
  Filled 2020-12-04 (×9): qty 2

## 2020-12-04 MED ORDER — ACETAMINOPHEN 325 MG PO TABS: 650.0000 mg | ORAL_TABLET | ORAL | Status: DC | PRN

## 2020-12-04 MED ORDER — TETANUS-DIPHTH-ACELL PERTUSSIS 5-2.5-18.5 LF-MCG/0.5 IM SUSY: 0.5000 mL | PREFILLED_SYRINGE | Freq: Once | INTRAMUSCULAR | Status: DC

## 2020-12-04 MED ORDER — OXYTOCIN BOLUS FROM INFUSION
333.0000 mL | Freq: Once | INTRAVENOUS | Status: AC
  Administered 2020-12-04: 333 mL via INTRAVENOUS

## 2020-12-04 MED ORDER — DIPHENHYDRAMINE HCL 50 MG/ML IJ SOLN: 12.5000 mg | INTRAMUSCULAR | Status: DC | PRN

## 2020-12-04 MED ORDER — TERBUTALINE SULFATE 1 MG/ML IJ SOLN: 0.2500 mg | Freq: Once | INTRAMUSCULAR | Status: DC | PRN

## 2020-12-04 MED ORDER — ONDANSETRON HCL 4 MG/2ML IJ SOLN: 4.0000 mg | Freq: Four times a day (QID) | INTRAMUSCULAR | Status: DC | PRN

## 2020-12-04 MED ORDER — OXYCODONE-ACETAMINOPHEN 5-325 MG PO TABS: 2.0000 | ORAL_TABLET | ORAL | Status: DC | PRN

## 2020-12-04 MED ORDER — LACTATED RINGERS IV SOLN: 500.0000 mL | INTRAVENOUS | Status: DC | PRN

## 2020-12-04 MED ORDER — SIMETHICONE 80 MG PO CHEW: 80.0000 mg | CHEWABLE_TABLET | ORAL | Status: DC | PRN

## 2020-12-04 MED ORDER — DIPHENHYDRAMINE HCL 25 MG PO CAPS: 25.0000 mg | ORAL_CAPSULE | Freq: Four times a day (QID) | ORAL | Status: DC | PRN

## 2020-12-04 MED ORDER — WITCH HAZEL-GLYCERIN EX PADS: 1.0000 "application " | MEDICATED_PAD | CUTANEOUS | Status: DC | PRN

## 2020-12-04 MED ORDER — SOD CITRATE-CITRIC ACID 500-334 MG/5ML PO SOLN: 30.0000 mL | ORAL | Status: DC | PRN

## 2020-12-04 MED ORDER — LIDOCAINE HCL (PF) 1 % IJ SOLN
INTRAMUSCULAR | Status: DC | PRN
  Administered 2020-12-04 (×2): 5 mL via EPIDURAL

## 2020-12-04 MED ORDER — ONDANSETRON HCL 4 MG/2ML IJ SOLN: 4.0000 mg | INTRAMUSCULAR | Status: DC | PRN

## 2020-12-04 MED ORDER — DIBUCAINE (PERIANAL) 1 % EX OINT
1.0000 | TOPICAL_OINTMENT | CUTANEOUS | Status: DC | PRN
Start: 2020-12-04 — End: 2020-12-06

## 2020-12-04 NOTE — H&P (Addendum)
OBSTETRIC ADMISSION HISTORY AND PHYSICAL  Carrie Nielsen is a 30 y.o. female G3P2002 with IUP at [redacted]w[redacted]d by L/6 presenting for spontaneous onset of labor. She reports +FMs, No LOF, no VB, no blurry vision, headaches or peripheral edema, and RUQ pain.  She plans on bottle feeding. She requests POPs for birth control.  She received her prenatal care at [facility]   Dating: By L/6 --->  Estimated Date of Delivery: 11/29/20  Sono:  @[redacted]w[redacted]d , CWD, normal anatomy, cephalic presentation, 2034g, 01-26-1985 EFW   Prenatal History/Complications:  - late prenatal care (onset at [redacted]w[redacted]d) - ASCUS with +HRHPV (08/26/20) - h/o HSV (no recent outbreaks, but poor adherence to valtrex)  Past Medical History: Past Medical History:  Diagnosis Date  . History of cold sores   . Medical history non-contributory     Past Surgical History: Past Surgical History:  Procedure Laterality Date  . NO PAST SURGERIES    . WISDOM TOOTH EXTRACTION      Obstetrical History: OB History    Gravida  3   Para  2   Term  2   Preterm      AB      Living  2     SAB      IAB      Ectopic      Multiple  0   Live Births  2           Social History Social History   Socioeconomic History  . Marital status: Single    Spouse name: Not on file  . Number of children: Not on file  . Years of education: Not on file  . Highest education level: Not on file  Occupational History  . Occupation: [employer]   Tobacco Use  . Smoking status: Never Smoker  . Smokeless tobacco: Never Used  Vaping Use  . Vaping Use: Never used  Substance and Sexual Activity  . Alcohol use: No    Alcohol/week: 0.0 standard drinks  . Drug use: No  . Sexual activity: Yes    Partners: Male  Other Topics Concern  . Not on file  Social History Narrative  . Not on file   Social Determinants of Health   Financial Resource Strain: Not on file  Food Insecurity: Not on file  Transportation Needs: Not on file  Physical Activity: Not on  file  Stress: Not on file  Social Connections: Not on file    Family History: Family History  Problem Relation Age of Onset  . Cancer Maternal Grandfather        brain  . Alcohol abuse Neg Hx   . Arthritis Neg Hx   . Asthma Neg Hx   . Birth defects Neg Hx   . COPD Neg Hx   . Depression Neg Hx   . Diabetes Neg Hx   . Early death Neg Hx   . Drug abuse Neg Hx   . Hearing loss Neg Hx   . Heart disease Neg Hx   . Hyperlipidemia Neg Hx   . Hypertension Neg Hx   . Kidney disease Neg Hx   . Learning disabilities Neg Hx   . Mental illness Neg Hx   . Mental retardation Neg Hx   . Miscarriages / Stillbirths Neg Hx   . Stroke Neg Hx   . Vision loss Neg Hx     Allergies: No Known Allergies  Medications Prior to Admission  Medication Sig Dispense Refill Last Dose  . pantoprazole (PROTONIX)  40 MG tablet Take 1 tablet (40 mg total) by mouth daily. (Patient not taking: Reported on 11/28/2020) 30 tablet 0   . Prenat-FeAsp-Meth-FA-DHA w/o A (PRENATE PIXIE) 10-0.6-0.4-200 MG CAPS Take 1 capsule by mouth daily. (Patient not taking: No sig reported) 30 capsule 11   . promethazine (PHENERGAN) 25 MG tablet Take 1 tablet (25 mg total) by mouth every 6 (six) hours as needed for nausea or vomiting. (Patient not taking: No sig reported) 30 tablet 2   . simethicone (MYLICON) 80 MG chewable tablet Chew 1 tablet (80 mg total) by mouth 4 (four) times daily as needed for flatulence. (Patient not taking: No sig reported) 30 tablet 0   . terconazole (TERAZOL 7) 0.4 % vaginal cream Place 1 applicator vaginally at bedtime. (Patient not taking: Reported on 11/28/2020) 45 g 0   . valACYclovir (VALTREX) 500 MG tablet Take 1 tablet (500 mg total) by mouth 2 (two) times daily. (Patient not taking: Reported on 11/28/2020) 60 tablet 6      Review of Systems   All systems reviewed and negative except as stated in HPI  Blood pressure 122/74, pulse 86, temperature 98.5 F (36.9 C), resp. rate 20, height 5\' 11"   (1.803 m), weight 97 kg, last menstrual period 03/04/2020, currently breastfeeding. General appearance: alert, cooperative and appears stated age Lungs: normal WOB Heart: regular rate Abdomen: soft, non-tender Extremities: no sign of DVT Presentation: cephalic Fetal monitoringBaseline: 130 bpm, Variability: Good {> 6 bpm), Accelerations: Reactive and Decelerations: Absent Uterine activityFrequency: Every 5 minutes Dilation: 4 Effacement (%): 70 Station: -2 Exam by:: DR Coriana Angello No visible lesions on sterile speculum exam.  Prenatal labs: ABO, Rh: O/Positive/-- (10/18 1427) Antibody: Negative (10/18 1427) Rubella: 1.42 (10/18 1427) RPR: Non Reactive (11/02 1017)  HBsAg: Negative (10/18 1427)  HIV: Non Reactive (11/02 1017)  GBS: Negative/-- (01/03 0413)  2 hr Glucola wnl Genetic screening wnl Anatomy [birth year]1979 wnl  Prenatal Transfer Tool  Maternal Diabetes: No Genetic Screening: Normal Maternal Ultrasounds/Referrals: Normal Fetal Ultrasounds or other Referrals:  None Maternal Substance Abuse:  No Significant Maternal Medications:  None Significant Maternal Lab Results: Group B Strep negative  No results found for this or any previous visit (from the past 24 hour(s)).  Patient Active Problem List   Diagnosis Date Noted  . Post term pregnancy, 41 weeks 12/04/2020  . ASCUS with positive high risk HPV cervical 11/20/2020  . Supervision of other normal pregnancy, antepartum 11/11/2020  . History of herpes genitalis 11/11/2020  . Late prenatal care complicating pregnancy in third trimester 09/10/2020  . Subchorionic hemorrhage in first trimester 04/26/2020  . 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 Esophageal reflux 02/08/2013    Assessment/Plan:  Carrie Nielsen is a 30 y.o. G3P2002 at [redacted]w[redacted]d here for spontaneous onset of labor.  #Labor: Will plan to manage expectantly and augment as clinically indicated. #Pain: pt desires epidural #FWB:  Category 1 strip #ID:  GBS negative #MOF: bottle #MOC: desires POPs #Circ: desires #H/o HSV: Poor adherence to suppressive valtrex. No recent outbreaks, no prodromal signs/symptoms reported on admission. Negative speculum exam on admission.  #Late PNC: established at [redacted]w[redacted]d #ASCUS with +HRHPV: abnormal pap on 08/26/20. Plan for PP colpo.  08/28/20, MD OB Fellow, Faculty [facility] 12/04/2020 5:14 AM

## 2020-12-04 NOTE — Anesthesia Preprocedure Evaluation (Signed)
Anesthesia Evaluation  Patient identified by MRN, date of birth, ID band Patient awake    Reviewed: Allergy & Precautions, Patient's Chart, lab work & pertinent test results  Airway Mallampati: II  TM Distance: >3 FB Neck ROM: Full    Dental no notable dental hx. (+) Teeth Intact   Pulmonary neg pulmonary ROS,    Pulmonary exam normal breath sounds clear to auscultation       Cardiovascular negative cardio ROS Normal cardiovascular exam Rhythm:Regular Rate:Normal     Neuro/Psych negative neurological ROS  negative psych ROS   GI/Hepatic Neg liver ROS, GERD  Medicated and Controlled,  Endo/Other  negative endocrine ROS  Renal/GU negative Renal ROS  negative genitourinary   Musculoskeletal negative musculoskeletal ROS (+)   Abdominal   Peds  Hematology negative hematology ROS (+)   Anesthesia Other Findings   Reproductive/Obstetrics (+) Pregnancy HSV                             Anesthesia Physical Anesthesia Plan  ASA: II  Anesthesia Plan: Epidural   Post-op Pain Management:    Induction:   PONV Risk Score and Plan:   Airway Management Planned: Natural Airway  Additional Equipment:   Intra-op Plan:   Post-operative Plan:   Informed Consent: I have reviewed the patients History and Physical, chart, labs and discussed the procedure including the risks, benefits and alternatives for the proposed anesthesia with the patient or authorized representative who has indicated his/her understanding and acceptance.       Plan Discussed with: Anesthesiologist  Anesthesia Plan Comments:         Anesthesia Quick Evaluation

## 2020-12-04 NOTE — Anesthesia Postprocedure Evaluation (Signed)
Anesthesia Post Note  Patient: Carrie Nielsen  Procedure(s) Performed: AN AD HOC LABOR EPIDURAL     Patient location during evaluation: Mother Baby Anesthesia Type: Epidural Level of consciousness: awake Pain management: satisfactory to patient Vital Signs Assessment: post-procedure vital signs reviewed and stable Respiratory status: spontaneous breathing Cardiovascular status: stable Anesthetic complications: no   No complications documented.  Last Vitals:  Vitals:   12/04/20 1107 12/04/20 1450  BP: 114/83 126/80  Pulse: 75 80  Resp: 17 17  Temp: 36.5 C   SpO2: 100% 95%    Last Pain:  Vitals:   12/04/20 1450  TempSrc: Oral  PainSc:    Pain Goal: Patients Stated Pain Goal: 0 (12/04/20 1107)                 Cephus Shelling

## 2020-12-04 NOTE — Discharge Summary (Signed)
Postpartum Discharge Summary     Patient Name: Carrie Nielsen DOB: September 23, 1991 MRN: [id number]  Date of admission: 12/04/2020 Delivery date:12/04/2020  Delivering provider: Randa Ngo  Date of discharge: 12/06/2020  Admitting diagnosis: Post term pregnancy, 41 weeks [O48.0, Z3A.41] Indication for care in labor or delivery [O75.9] Intrauterine pregnancy: [redacted]w[redacted]d    Secondary diagnosis:  Principal Problem:   Vaginal delivery Active Problems:   Post term pregnancy, 41 weeks   Indication for care in labor or delivery  Additional problems: as noted above   Discharge diagnosis: Vaginal delivery                                         Post partum procedures:none Augmentation: AROM immediately prior to delivery Complications: None  Hospital course: Onset of Labor With Vaginal Delivery      30y.o. yo [id number] admitted in Latent Labor on 12/04/2020. Patient had an uncomplicated labor course as follows:  Membrane Rupture Time/Date: 7:34 AM ,12/04/2020   Delivery Method:Vaginal, Spontaneous  Episiotomy: None  Lacerations:  1st degree;Perineal  Patient had an uncomplicated postpartum course.  She is ambulating, tolerating a regular diet, passing flatus, and urinating well. Patient is discharged home in stable condition on 12/06/20.  Newborn Data: Birth date:12/04/2020  Birth time:7:35 AM  Gender:Female  Living status:Living  Apgars:9 ,9  Weight:2991 g   Magnesium Sulfate received: No BMZ received: No Rhophylac:N/A MMR:N/A T-DaP:not documented in chart Flu: not documented in chart Transfusion:No  Physical exam  Vitals:   12/05/20 0000 12/05/20 1553 12/05/20 2033 12/06/20 0522  BP: 110/70 120/79 126/71 113/63  Pulse: 85 72 80 72  Resp: 18 17 16 17   Temp: 98.4 F (36.9 C) 97.8 F (36.6 C) 98 F (36.7 C) 98.1 F (36.7 C)  TempSrc: Oral Oral Oral Oral  SpO2: 100% 99% 98% 99%  Weight:      Height:       General: alert, cooperative and no distress Lochia:  appropriate Uterine Fundus: firm Incision: N/A DVT Evaluation: No evidence of DVT seen on physical exam. Negative Homan's sign. No cords or calf tenderness. No significant calf/ankle edema. Labs: Lab Results  Component Value Date   WBC 13.1 (H) 12/05/2020   HGB 10.6 (L) 12/05/2020   HCT 31.4 (L) 12/05/2020   MCV 90.8 12/05/2020   PLT 243 12/05/2020   CMP Latest Ref Rng & Units 04/26/2020  Glucose 70 - 99 mg/dL 101(H)  BUN 6 - 20 mg/dL 10  Creatinine 0.44 - 1.00 mg/dL 0.62  Sodium 135 - 145 mmol/L 134(L)  Potassium 3.5 - 5.1 mmol/L 3.4(L)  Chloride 98 - 111 mmol/L 102  CO2 22 - 32 mmol/L 26  Calcium 8.9 - 10.3 mg/dL 9.2  Total Protein 6.5 - 8.1 g/dL 6.7  Total Bilirubin 0.3 - 1.2 mg/dL 0.6  Alkaline Phos 38 - 126 U/L 42  AST 15 - 41 U/L 25  ALT 0 - 44 U/L 37   Edinburgh Score: No flowsheet data found.   After visit meds:  Allergies as of 12/06/2020   No Known Allergies     Medication List    STOP taking these medications   pantoprazole 40 MG tablet Commonly known as: Protonix   promethazine 25 MG tablet Commonly known as: PHENERGAN   simethicone 80 MG chewable tablet Commonly known as: MYLICON   terconazole 0.4 % vaginal cream  Commonly known as: TERAZOL 7   valACYclovir 500 MG tablet Commonly known as: VALTREX     TAKE these medications   ibuprofen 600 MG tablet Commonly known as: ADVIL Take 1 tablet (600 mg total) by mouth every 6 (six) hours.   Prenate Pixie 10-0.6-0.4-200 MG Caps Take 1 capsule by mouth daily.        Discharge home in stable condition Infant Feeding: Bottle Infant Disposition:home with mother Discharge instruction: per After Visit Summary and Postpartum booklet. Activity: Advance as tolerated. Pelvic rest for 6 weeks.  Diet: routine diet Future Appointments: Future Appointments  Date Time Provider [place]  01/15/2021  1:00 PM Randa Ngo, MD [place] None   Follow up Visit:  [place] Follow up on 01/15/2021.   Specialty: Obstetrics and Gynecology Why: for postpartum checkup Contact information: [address], [place] [phone]             Message sent to [facility] on 12/04/20.  Please schedule this patient for a In person postpartum visit in 6 weeks with the following provider: Any provider. Additional Postpartum F/U:none  Low risk pregnancy complicated by: h/o HSV in pregnancy Delivery mode:  Vaginal, Spontaneous  Anticipated Birth Control:  POPs   12/06/2020 Christin Fudge, CNM

## 2020-12-04 NOTE — MAU Note (Signed)
PT SAYS HER MUCUS PLUG HAS BEEN COMING OUT . PNC WITH [facility] - THEY STRIPPED MEMBRANES ON Friday .   IS AN INDUCTION ON 1-28.  FEELS STRONG UC. . VE ON Friday -  1 CM .  HAS HX HSV- LAST OUTBREAK SUMMER OF LAST YEAR.  TAKES VALTREX- WHEN THINKS HAS OUTBREAK- LAST TAKEN - 6-21.  DENIES ANY S/S OF AN OUTBREAK .   GBS- UNSURE.

## 2020-12-04 NOTE — Discharge Instructions (Signed)

## 2020-12-04 NOTE — Anesthesia Procedure Notes (Signed)
Epidural Patient location during procedure: OB Start time: 12/04/2020 6:25 AM End time: 12/04/2020 6:35 AM  Staffing Anesthesiologist: Mal Amabile, MD Performed: anesthesiologist   Preanesthetic Checklist Completed: patient identified, IV checked, site marked, risks and benefits discussed, surgical consent, monitors and equipment checked, pre-op evaluation and timeout performed  Epidural Patient position: sitting Prep: DuraPrep and site prepped and draped Patient monitoring: continuous pulse ox and blood pressure Approach: midline Location: L3-L4 Injection technique: LOR air  Needle:  Needle type: Tuohy  Needle gauge: 17 G Needle length: 9 cm and 9 Needle insertion depth: 6 cm Catheter type: closed end flexible Catheter size: 19 Gauge Catheter at skin depth: 11 cm Test dose: negative and Other  Assessment Events: blood not aspirated, injection not painful, no injection resistance, no paresthesia and negative IV test  Additional Notes Patient identified. Risks and benefits discussed including failed block, incomplete  Pain control, post dural puncture headache, nerve damage, paralysis, blood pressure Changes, nausea, vomiting, reactions to medications-both toxic and allergic and post Partum back pain. All questions were answered. Patient expressed understanding and wished to proceed. Sterile technique was used throughout procedure. Epidural site was Dressed with sterile barrier dressing. No paresthesias, signs of intravascular injection Or signs of intrathecal spread were encountered.  Patient was more comfortable after the epidural was dosed. Please see RN's note for documentation of vital signs and FHR which are stable. Reason for block:procedure for pain

## 2020-12-04 NOTE — Plan of Care (Signed)

## 2020-12-05 LAB — CBC
HCT: 31.4 % — ABNORMAL LOW (ref 36.0–46.0)
Hemoglobin: 10.6 g/dL — ABNORMAL LOW (ref 12.0–15.0)
MCH: 30.6 pg (ref 26.0–34.0)
MCHC: 33.8 g/dL (ref 30.0–36.0)
MCV: 90.8 fL (ref 80.0–100.0)
Platelets: 243 10*3/uL (ref 150–400)
RBC: 3.46 MIL/uL — ABNORMAL LOW (ref 3.87–5.11)
RDW: 14.1 % (ref 11.5–15.5)
WBC: 13.1 10*3/uL — ABNORMAL HIGH (ref 4.0–10.5)
nRBC: 0 % (ref 0.0–0.2)

## 2020-12-05 NOTE — Progress Notes (Signed)
CSW received consult for late and limited PNC.  CSW reviewed chart and is screening out consult as it does not meet criteria for automatic CSW involvement and infant drug screening.  CSW noted that MOB started care at 26 weeks and 3 days and had a total of 5 visits which indicates that  MOB started care prior to 28 weeks and had more than 3 visits.    Please contact CSW if current concerns arise or by MOB's request or if MON scored 10 or higher on Edinburgh.    Carrie Nielsen, MSW, LCSW [facility] [place] [phone]

## 2020-12-05 NOTE — Progress Notes (Signed)
CSW made aware that MOB is in need of carseat. CSW spoke with RN and was advised that MOB would have cash for carseat in 12/06/20. CSW to provide carseat to MOB at that time.     Chairty Toman S. Abrina Petz, MSW, LCSW [facility] [place] [phone]   

## 2020-12-05 NOTE — Progress Notes (Signed)
Patient ID: Carrie Nielsen, female   DOB: May 06, 1991, 30 y.o.   MRN: [id number] Post Partum Day 1 Subjective: [facility] 29yo G3P3 denies complaints. She reports ambulating, urinating appropriately, and passing flatus. She denies N/V, stooling, discharge. She reports vaginal bleeding w/o passing clots. She wants OCPs at [facility] visit. Wants circumcision for baby boy and is consented. Wants to stay another night.   VSS  Objective: Blood pressure 110/70, pulse 85, temperature 98.4 F (36.9 C), temperature source Oral, resp. rate 18, height 5\' 11"  (1.803 m), weight 97 kg, last menstrual period 03/04/2020, SpO2 100 %, unknown if currently breastfeeding.  Physical Exam:  General: alert, cooperative, appears stated age and no distress Lochia: appropriate Uterine Fundus: firm DVT Evaluation: No evidence of DVT seen on physical exam. Negative Homan's sign. No cords or calf tenderness. No significant calf/ankle edema.  Recent Labs    12/04/20 0448 12/05/20 0620  HGB 12.2 10.6*  HCT 35.5* 31.4*    Assessment/Plan: [facility] 29[facility] s/p NSVD at [redacted]w[redacted]d.  Routine Postpartum Care: Doing well, pain well-controlled. -- Continue Routine Care -- Contraception: OCP's at [facility] f/u -- Feeding: Bottle -- Late PNC: SW consulted -- Circumcision: Consented; still needs to be performed  Dispo: Plan for discharge PPD#2.   LOS: 1 day  [facility] 12/05/2020, 7:40 AM

## 2020-12-06 ENCOUNTER — Inpatient Hospital Stay (HOSPITAL_COMMUNITY): Payer: Medicaid Other

## 2020-12-06 MED ORDER — IBUPROFEN 600 MG PO TABS: 600.0000 mg | ORAL_TABLET | Freq: Four times a day (QID) | ORAL | 0 refills | Status: DC

## 2020-12-06 NOTE — Progress Notes (Signed)
CSW provided MOB with careseat this time.    Makailey Hodgkin S. Corleen Otwell, MSW, LCSW [facility] [place] [phone]   

## 2021-01-15 ENCOUNTER — Ambulatory Visit: Payer: Medicaid Other | Admitting: Obstetrics and Gynecology

## 2021-01-16 ENCOUNTER — Telehealth: Payer: Self-pay

## 2021-01-16 NOTE — Telephone Encounter (Signed)
The patient was called to reschedule her missed appointment. I was unable to speak with the patient or leave a voicemail.

## 2021-01-30 ENCOUNTER — Other Ambulatory Visit: Payer: Self-pay

## 2021-01-30 ENCOUNTER — Encounter: Payer: Self-pay | Admitting: Emergency Medicine

## 2021-01-30 ENCOUNTER — Ambulatory Visit
Admission: EM | Admit: 2021-01-30 | Discharge: 2021-01-30 | Disposition: A | Discharge status: Home / Self Care | Payer: Medicaid Other | Attending: Emergency Medicine | Admitting: Emergency Medicine

## 2021-01-30 ENCOUNTER — Ambulatory Visit: Payer: Self-pay

## 2021-01-30 DIAGNOSIS — N750 Cyst of Bartholin's gland: Secondary | ICD-10-CM | POA: Insufficient documentation

## 2021-01-30 DIAGNOSIS — N898 Other specified noninflammatory disorders of vagina: Secondary | ICD-10-CM | POA: Diagnosis not present

## 2021-01-30 LAB — POCT URINALYSIS DIP (MANUAL ENTRY)
Glucose, UA: NEGATIVE mg/dL
Nitrite, UA: NEGATIVE
Protein Ur, POC: 30 mg/dL — AB
Spec Grav, UA: >=1.03 — AB (ref 1.010–1.025)
Urobilinogen, UA: 0.2 E.U./dL
pH, UA: 6 (ref 5.0–8.0)

## 2021-01-30 LAB — POCT URINE PREGNANCY: Preg Test, Ur: NEGATIVE

## 2021-01-30 MED ORDER — DOXYCYCLINE HYCLATE 100 MG PO CAPS: 100.0000 mg | ORAL_CAPSULE | Freq: Two times a day (BID) | ORAL | 0 refills | Status: AC

## 2021-01-30 MED ORDER — FLUCONAZOLE 150 MG PO TABS: 150.0000 mg | ORAL_TABLET | Freq: Once | ORAL | 0 refills | Status: AC

## 2021-01-30 NOTE — ED Triage Notes (Signed)
Provider triage  

## 2021-01-30 NOTE — Discharge Instructions (Signed)
Begin doxycycline twice daily for 1 week to treat Bartholin's abscess 1 tab of Diflucan today, may repeat after completing antibiotics Warm compresses/sitz bath's

## 2021-01-30 NOTE — ED Provider Notes (Signed)
[facility]    CSN: [id number] Arrival date & time: 01/30/21  1204      History   Chief Complaint No chief complaint on file. Vaginal irritation/discharge  HPI Carrie Nielsen is a 30 y.o. female presenting today for evaluation of vaginal irritation.  Reports over the past few days has had vaginal irritation, white discharge and itching.  Reports does feel similar to yeast, but also has history of BV.  Reports recently using a Plan B.  Is approximately 2 months postpartum, declines breast-feeding.  HPI  Past Medical History:  Diagnosis Date  . History of cold sores   . Medical history non-contributory     Patient Active Problem List   Diagnosis Date Noted  . Post term pregnancy, 41 weeks 12/04/2020  . Indication for care in labor or delivery 12/04/2020  . Vaginal delivery 12/04/2020  . ASCUS with positive high risk HPV cervical 11/20/2020  . Supervision of other normal pregnancy, antepartum 11/11/2020  . History of herpes genitalis 11/11/2020  . Late prenatal care complicating pregnancy in third trimester 09/10/2020  . Subchorionic hemorrhage in first trimester 04/26/2020  . 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 Esophageal reflux 02/08/2013    Past Surgical History:  Procedure Laterality Date  . NO PAST SURGERIES    . WISDOM TOOTH EXTRACTION      OB History    Gravida  3   Para  3   Term  3   Preterm      AB      Living  3     SAB      IAB      Ectopic      Multiple  0   Live Births  3            Home Medications    Prior to Admission medications   Medication Sig Start Date End Date Taking? Authorizing Provider  doxycycline (VIBRAMYCIN) 100 MG capsule Take 1 capsule (100 mg total) by mouth 2 (two) times daily for 7 days. 01/30/21 02/06/21 Yes Hurley Blevins C, PA-C  fluconazole (DIFLUCAN) 150 MG tablet Take 1 tablet (150 mg total) by mouth once for 1 dose. 01/30/21 01/30/21 Yes Nikash Mortensen C, PA-C   ibuprofen (ADVIL) 600 MG tablet Take 1 tablet (600 mg total) by mouth every 6 (six) hours. 12/06/20   Jacklyn Shell, CNM    Family History Family History  Problem Relation Age of Onset  . Cancer Maternal Grandfather        brain  . Alcohol abuse Neg Hx   . Arthritis Neg Hx   . Asthma Neg Hx   . Birth defects Neg Hx   . COPD Neg Hx   . Depression Neg Hx   . Diabetes Neg Hx   . Early death Neg Hx   . Drug abuse Neg Hx   . Hearing loss Neg Hx   . Heart disease Neg Hx   . Hyperlipidemia Neg Hx   . Hypertension Neg Hx   . Kidney disease Neg Hx   . Learning disabilities Neg Hx   . Mental illness Neg Hx   . Mental retardation Neg Hx   . Miscarriages / Stillbirths Neg Hx   . Stroke Neg Hx   . Vision loss Neg Hx     Social History Social History   Tobacco Use  . Smoking status: Never Smoker  . Smokeless tobacco: Never Used  Vaping Use  . Vaping Use: Never used  Substance Use Topics  . Alcohol use: No    Alcohol/week: 0.0 standard drinks  . Drug use: No     Allergies   Patient has no known allergies.   Review of Systems Review of Systems  Constitutional: Negative for fever.  Respiratory: Negative for shortness of breath.   Cardiovascular: Negative for chest pain.  Gastrointestinal: Negative for abdominal pain, diarrhea, nausea and vomiting.  Genitourinary: Positive for vaginal discharge. Negative for dysuria, flank pain, genital sores, hematuria, menstrual problem, vaginal bleeding and vaginal pain.  Musculoskeletal: Negative for back pain.  Skin: Negative for rash.  Neurological: Negative for dizziness, light-headedness and headaches.     Physical Exam Triage Vital Signs ED Triage Vitals [01/30/21 1227]  Enc Vitals Group     BP 126/77     Pulse Rate 86     Resp 16     Temp 98.4 F (36.9 C)     Temp Source Oral     SpO2 97 %     Weight      Height      Head Circumference      Peak Flow      Pain Score      Pain Loc      Pain Edu?       Excl. in GC?    No data found.  Updated Vital Signs BP 126/77 (BP Location: Left Arm)   Pulse 86   Temp 98.4 F (36.9 C) (Oral)   Resp 16   LMP 03/04/2020   SpO2 97%   Visual Acuity Right Eye Distance:   Left Eye Distance:   Bilateral Distance:    Right Eye Near:   Left Eye Near:    Bilateral Near:     Physical Exam Vitals and nursing note reviewed.  Constitutional:      Appearance: She is well-developed.     Comments: No acute distress  HENT:     Head: Normocephalic and atraumatic.     Nose: Nose normal.  Eyes:     Conjunctiva/sclera: Conjunctivae normal.  Cardiovascular:     Rate and Rhythm: Normal rate.  Pulmonary:     Effort: Pulmonary effort is normal. No respiratory distress.  Abdominal:     General: There is no distension.  Genitourinary:    Comments: Swelling and increased erythema noted around left Bartholin's area with fluctuance, mild purulent drainage noted, and vaginal vault yellowish-greenish discharge, no cervical erythema Musculoskeletal:        General: Normal range of motion.     Cervical back: Neck supple.  Skin:    General: Skin is warm and dry.  Neurological:     Mental Status: She is alert and oriented to person, place, and time.      UC Treatments / Results  Labs (all labs ordered are listed, but only abnormal results are displayed) Labs Reviewed  POCT URINALYSIS DIP (MANUAL ENTRY) - Abnormal; Notable for the following components:      Result Value   Bilirubin, UA small (*)    Ketones, POC UA small (15) (*)    Spec Grav, UA >=1.030 (*)    Blood, UA trace-intact (*)    Protein Ur, POC =30 (*)    Leukocytes, UA Moderate (2+) (*)    All other components within normal limits  URINE CULTURE  POCT URINE PREGNANCY  CERVICOVAGINAL ANCILLARY ONLY    EKG   Radiology No results found.  Procedures Procedures (including critical care time)  Medications Ordered in UC Medications -  No data to display  Initial Impression /  Assessment and Plan / UC Course  I have reviewed the triage vital signs and the nursing notes.  Pertinent labs & imaging results that were available during my care of the patient were reviewed by me and considered in my medical decision making (see chart for details).     Bartholin cyst along with discharge, placing on doxycycline to cover for possible infection/abscess and cyst as well as Diflucan for yeast.  Swab pending to further evaluate causes of discharge.  Will alter therapy as needed based off results.  Warm compresses and sitz bath's.  Follow-up with OB/GYN as planned for Bartholin cyst.  Discussed strict return precautions. Patient verbalized understanding and is agreeable with plan.  Final Clinical Impressions(s) / UC Diagnoses   Final diagnoses:  Bartholin's cyst  Vaginal discharge     Discharge Instructions     Begin doxycycline twice daily for 1 week to treat Bartholin's abscess 1 tab of Diflucan today, may repeat after completing antibiotics Warm compresses/sitz bath's    ED Prescriptions    Medication Sig Dispense Auth. Provider   fluconazole (DIFLUCAN) 150 MG tablet Take 1 tablet (150 mg total) by mouth once for 1 dose. 2 tablet Shanara Schnieders C, PA-C   doxycycline (VIBRAMYCIN) 100 MG capsule Take 1 capsule (100 mg total) by mouth 2 (two) times daily for 7 days. 14 capsule Hendrix Console, [place] C, PA-C     PDMP not reviewed this encounter.   Lew Dawes, [place] 01/30/21 1519

## 2021-01-31 ENCOUNTER — Telehealth (HOSPITAL_COMMUNITY): Payer: Self-pay | Admitting: Emergency Medicine

## 2021-01-31 LAB — CERVICOVAGINAL ANCILLARY ONLY
Bacterial Vaginitis (gardnerella): POSITIVE — AB
Candida Glabrata: NEGATIVE
Candida Vaginitis: POSITIVE — AB
Chlamydia: NEGATIVE
Comment: NEGATIVE
Comment: NEGATIVE
Comment: NEGATIVE
Comment: NEGATIVE
Comment: NEGATIVE
Comment: NORMAL
Neisseria Gonorrhea: NEGATIVE
Trichomonas: POSITIVE — AB

## 2021-01-31 MED ORDER — METRONIDAZOLE 500 MG PO TABS: 500.0000 mg | ORAL_TABLET | Freq: Two times a day (BID) | ORAL | 0 refills | Status: DC

## 2021-02-01 LAB — URINE CULTURE: Culture: 70000 — AB

## 2021-02-06 ENCOUNTER — Telehealth: Payer: Self-pay

## 2021-02-17 ENCOUNTER — Encounter: Payer: Self-pay | Admitting: Obstetrics

## 2021-02-17 ENCOUNTER — Other Ambulatory Visit: Payer: Self-pay

## 2021-02-17 ENCOUNTER — Ambulatory Visit (INDEPENDENT_AMBULATORY_CARE_PROVIDER_SITE_OTHER): Payer: Medicaid Other | Admitting: Obstetrics

## 2021-02-17 DIAGNOSIS — N750 Cyst of Bartholin's gland: Secondary | ICD-10-CM

## 2021-02-17 DIAGNOSIS — Z3009 Encounter for other general counseling and advice on contraception: Secondary | ICD-10-CM | POA: Diagnosis not present

## 2021-02-17 DIAGNOSIS — Z30011 Encounter for initial prescription of contraceptive pills: Secondary | ICD-10-CM

## 2021-02-17 MED ORDER — NORETHIN ACE-ETH ESTRAD-FE 1-20 MG-MCG(24) PO TABS: 1.0000 | ORAL_TABLET | Freq: Every day | ORAL | 11 refills | Status: DC

## 2021-02-17 MED ORDER — IBUPROFEN 800 MG PO TABS: 800.0000 mg | ORAL_TABLET | Freq: Three times a day (TID) | ORAL | 5 refills | Status: DC | PRN

## 2021-02-17 NOTE — Patient Instructions (Signed)
Bartholin's Cyst  A Bartholin's cyst is a fluid-filled sac that forms as a result of a blockage along the tube (duct) of the Bartholin's gland. Bartholin's glands are small glands in the folds of skin around the vaginal opening (labia). These glands produce fluid to moisten or lubricate the outside of the vagina during sex. A cyst that is not large or infected may not cause any problems or require treatment. If the cyst gets infected with bacteria, it is called a Bartholin's abscess. An abscess may cause symptoms such as pain and swelling and is more likely to require treatment. What are the causes? This condition may be caused by a blocked Bartholin's gland duct. These ducts can become blocked due to natural buildup of fluid and oils. Bacteria inside of the cyst can cause infection. In many cases, the cause is not known. What are the signs or symptoms? Symptoms may include:  A bulge or lump on the labia, near the lower opening of the vagina.  Discomfort or pain. This may get worse during sex or when walking.  Redness, swelling, or fluid draining from the area. These may be signs of an abscess. How severe your symptoms are depends on the size of your cyst and whether it is infected. Infection causes symptoms to get more severe. How is this diagnosed? This condition may be diagnosed based on:  Your symptoms and medical history.  A physical exam to check for swelling in your vaginal area. You may lie on your back on an exam table and have your feet placed into footrests for the exam.  Blood tests to check for infections.  Removal of a fluid sample from the cyst or abscess (biopsy) for testing. You may work with a health care provider who specializes in women's health (gynecologist) for diagnosis and treatment. How is this treated? If your cyst is small, not infected, and not causing symptoms, you may not need treatment. These cysts often go away on their own, with home care such as hot  baths or warm compresses. If you have a large cyst or an abscess, treatment may include:  Antibiotic medicine.  A procedure to drain the fluid inside the cyst or abscess. These procedures involve making an incision in the cyst or abscess so that the fluid drains out, and then one of the following may be done: ? A small, thin tube (catheter) may be placed inside the cyst or abscess so that it does not close and fill up with fluid again (fistulization). The catheter will be removed at a follow-up visit. ? The edges of the incision may be stitched to your skin so that the cyst or abscess stays open (marsupialization). This allows it to continue to drain and not fill up with fluid again. If you have cysts or abscesses that keep returning (recurring) and have required incision and drainage multiple times, your health care provider may talk with you about surgery to remove the Bartholin's gland. Follow these instructions at home: Medicines  Take over-the-counter and prescription medicines only as told by your health care provider.  If you were prescribed an antibiotic medicine, take it as told by your health care provider. Do not stop taking the antibiotic even if your condition improves. Managing pain and swelling  Try sitz baths to help with pain and swelling. A sitz bath is a warm water bath in which the water only comes up to your hips and should cover your buttocks. You may take sitz baths several times a   day.  Apply heat to the affected area as often as needed. Use the heat source that your health care provider recommends, such as a moist heat pack or a heating pad. ? Place a towel between your skin and the heat source. ? Leave the heat on for 20-30 minutes. ? Remove the heat if your skin turns bright red. This is especially important if you are unable to feel pain, heat, or cold. You may have a greater risk of getting burned. Do not fall asleep with the heating pad in place. General  instructions  If your cyst or abscess was drained, follow instructions from your health care provider about how to take care of your wound. Use feminine pads as needed to absorb any drainage.  Do not push on or squeeze your cyst.  Do not have sex until the cyst has gone away or your wound from drainage has healed.  Take these steps to help prevent a Bartholin's cyst from returning and to prevent other Bartholin's cysts from developing: ? Take a bath or shower once a day. Clean your vaginal area with mild soap and water when you bathe. ? Practice safe sex to prevent STIs. Talk with your health care provider about how to prevent STIs and which forms of birth control (contraception) may be best for you.  Keep all follow-up visits. This is important. Contact a health care provider if:  You have a fever.  You develop increasing redness, swelling, or pain around your cyst.  You have fluid, blood, pus, or a bad smell coming from your cyst.  You have a cyst that gets larger or comes back. Summary  A Bartholin's cyst is a fluid-filled sac that forms as a result of a blockage along the duct of the Bartholin's gland.  If your cyst is small, not infected, and not causing symptoms, you may not need any treatment.  If you have a large cyst or an abscess, your health care provider may perform a procedure to drain the fluid.  If you have cysts or abscesses that keep returning (recurring) and have required incision and drainage multiple times, your health care provider may talk with you about surgery to remove the Bartholin's gland. This information is not intended to replace advice given to you by your health care provider. Make sure you discuss any questions you have with your health care provider. Document Revised: 03/25/2020 Document Reviewed: 03/25/2020 Elsevier Patient Education  2021 Elsevier  Inc.  [email or web address]  Colposcopy  Colposcopy is a procedure to examine the lowest part of the uterus (cervix), the vagina, and the area around the vaginal opening (vulva) for abnormalities or signs of disease. This procedure is done using an instrument that makes objects appear larger and provides light. (colposcope). During the procedure, the health care provider may remove a tissue sample to be looked at later under a microscope (biopsy). A biopsy may be done if any unusual cells are found during the colposcopy. You may have a colposcopy if you have:  An abnormal Pap smear, also called a Pap test. This screening test is used to check for signs of cancer or infection of the vagina, cervix, and uterus.  An HPV (human papillomavirus) test and get a positive result for a type of HPV that puts you at high risk of cancer.  Certain conditions or symptoms, such as: ? A sore, or lesion, on your cervix. ? Genital warts on your vulva, vagina, or cervix. ? Pain during sex. ? Vaginal bleeding,  especially after sex.  A growth on your cervix (cervical polyp) that needs to be removed. Let your health care provider know about:  Any allergies you have, including allergies to medicines, latex, or iodine.  All medicines you are taking, including vitamins, herbs, eye drops, creams, and over-the-counter medicines.  Any problems you or family members have had with anesthetic medicines.  Any blood disorders you have.  Any surgeries you have had.  Any medical conditions you have, such as pelvic inflammatory disease (PID) or endometrial disorder.  The pattern of your menstrual cycles and the form of birth control (contraception) you use, if any.  Your medical history, including any history of fainting often or of cervical treatment.  Whether you are pregnant or may be pregnant. What are the risks? Generally, this is a safe  procedure. However, problems may occur, including:  Infection. Symptoms of infection may include fever, bad-smelling vaginal discharge, or pelvic pain.  Allergic reactions to medicines.  Damage to nearby structures or organs.  Fainting. This is rare. What happens before the procedure? Eating and drinking restrictions  Follow instructions from your health care provider about eating or drinking restrictions.  You will likely need to eat a regular diet the day of the procedure and not skip any meals. Tests  You may have an exam or testing. A pregnancy test will be done the day of the procedure.  You may have a blood or urine sample taken. General instructions  Ask your health care provider about: ? Changing or stopping your regular medicines. This is especially important if you are taking diabetes medicines or blood thinners. ? Taking medicines such as aspirin and ibuprofen. These medicines can thin your blood. Do not take these medicines unless your health care provider tells you to take them. Your health care provider will likely tell you to avoid taking aspirin, or medicine that contains aspirin, for 7 days before the procedure. ? Taking over-the-counter medicines, vitamins, herbs, and supplements.  Tell your health care provider if you have your menstrual period now or will have it at the time of your procedure. A colposcopy is not normally done during your menstrual period.  If you use contraception, continue to use it before your procedure.  For 24 hours before the procedure: ? Do not use douche products or tampons. ? Do not use medicines, creams, or suppositories in the vagina. ? Do not have sex.  Ask your health care provider what steps will be taken to prevent infection. What happens during the procedure?  You will lie down on your back, with your feet in foot rests (stirrups).  A tool called a speculum will be warmed and will have oil or gel put on it (will be  lubricated). The speculum will then be inserted into your vagina. This will be used to hold apart the walls of your vagina so your health care provider can see your cervix and the inside of your vagina.  A cotton swab will be used to place a small amount of a liquid (solution) on the areas to be examined. This solution makes it easier to see abnormal cells. You may feel a slight burning during this part.  The colposcope will be used to scan the cervix with a bright white light. The colposcope will be held near your vulva and will make your vulva, vagina, and cervix look bigger so they can be seen better.  If a biopsy is needed: ? You may be given a medicine to numb  the area (local anesthetic). ? Surgical tools will be used to remove mucus and cells through your vagina. ? You may feel mild pain while the tissue sample is removed. ? Bleeding may occur. A solution may be used to stop the bleeding. ? If a biopsy is needed from the inside of the cervix, a different procedure called endocervical curettage (ECC) may be done. During this procedure, a curved tool called a curette will be used to scrape cells from your cervix or the top of your cervix (endocervix).  Any abnormalities that are found will be recorded. The procedure may vary among health care providers and hospitals. What happens after the procedure?  You will lie down and rest for a few minutes. You may be offered juice or cookies.  Your blood pressure, heart rate, breathing rate, and blood oxygen level will be monitored until you leave the hospital or clinic.  You may have some cramping in your abdomen. This should go away after a few minutes.  It is up to you to get the results of your procedure. Ask your health care provider, or the department that is doing the procedure, when your results will be ready. Summary  Colposcopy is a procedure to examine the lowest part of the uterus (cervix), the vagina, and the area around the vaginal  opening (vulva) for abnormalities or signs of disease.  A biopsy may be done as part of the procedure.  After the procedure, you will remain lying down and will rest for a few minutes.  You may have some cramping in your abdomen. This should go away after a few minutes. This information is not intended to replace advice given to you by your health care provider. Make sure you discuss any questions you have with your health care provider. Document Revised: 10/25/2019 Document Reviewed: 10/25/2019 Elsevier Patient Education  2021 [employer].

## 2021-02-17 NOTE — Progress Notes (Signed)
Post Partum Visit Note  Carrie Nielsen is a 30 y.o. [id number] female who presents for a postpartum visit. She is 8 weeks postpartum following a normal spontaneous vaginal delivery.  I have fully reviewed the prenatal and intrapartum course. The delivery was at 40.5 gestational weeks.  Anesthesia: epidural. Postpartum course has been unremarkable. Baby is doing well. Baby is feeding by bottle - Gerber Gentle. Bleeding no bleeding. Bowel function is normal. Bladder function is normal. Patient is sexually active. Contraception method is none. Postpartum depression screening: negative=0.   The pregnancy intention screening data noted above was reviewed. Potential methods of contraception were discussed. The patient elected to proceed with Female Condom.    Edinburgh Postnatal Depression Scale - 02/17/21 1505      Edinburgh Postnatal Depression Scale:  In the Past 7 Days   I have been able to laugh and see the funny side of things. 0    I have looked forward with enjoyment to things. 0    I have blamed myself unnecessarily when things went wrong. 0    I have been anxious or worried for no good reason. 0    I have felt scared or panicky for no good reason. 0    Things have been getting on top of me. 0    I have been so unhappy that I have had difficulty sleeping. 0    I have felt sad or miserable. 0    I have been so unhappy that I have been crying. 0    The thought of harming myself has occurred to me. 0    Edinburgh Postnatal Depression Scale Total 0            The following portions of the patient's history were reviewed and updated as appropriate: allergies, current medications, past family history, past medical history, past social history, past surgical history and problem list.  Review of Systems A comprehensive review of systems was negative.  Objective:  BP 106/64   Pulse (!) 56   Ht 5\' 9"  (1.753 m)   Wt 183 lb (83 kg)   LMP 12/12/2020 (Approximate)   Breastfeeding No    BMI 27.02 kg/m    General:  alert and no distress   Breasts:  inspection negative, no nipple discharge or bleeding, no masses or nodularity palpable  Lungs: clear to auscultation bilaterally  Heart:  regular rate and rhythm, S1, S2 normal, no murmur, click, rub or gallop  Abdomen: soft, non-tender; bowel sounds normal; no masses,  no organomegaly   Vulva:  Left Barthoin Cyst  Vagina: normal vagina  Cervix:  no cervical motion tenderness and no lesions  Corpus: normal size, contour, position, consistency, mobility, non-tender  Adnexa:  no mass, fullness, tenderness  Rectal Exam: Not performed.        Assessment:   1. Postpartum care following vaginal delivery - doing well  2. Encounter for other general counseling and advice on contraception - wants OCP's  3. Encounter for initial prescription of contraceptive pills Rx: - Norethindrone Acetate-Ethinyl Estrad-FE (LOESTRIN 24 FE) 1-20 MG-MCG(24) tablet; Take 1 tablet by mouth daily.  Dispense: 28 tablet; Refill: 11  4. Cyst of left Bartholin's gland duct, chronic Rx: - ibuprofen (ADVIL) 800 MG tablet; Take 1 tablet (800 mg total) by mouth every 8 (eight) hours as needed.  Dispense: 30 tablet; Refill: 5    Plan:   Essential components of care per ACOG recommendations:  1.  Mood and well being: Patient with  negative depression screening today. Reviewed local resources for support.  - Patient does not use tobacco.  - hx of drug use? No    2. Infant care and feeding:  -Patient currently breastmilk feeding? No  -Social determinants of health (SDOH) reviewed in EPIC. No concerns  3. Sexuality, contraception and birth spacing - Patient does not want a pregnancy in the next year.  Desired family size is 3 children.  - Reviewed forms of contraception in tiered fashion. Patient desired oral contraceptives (estrogen/progesterone) today.   - Discussed birth spacing of 18 months  4. Sleep and fatigue -Encouraged  family/partner/community support of 4 hrs of uninterrupted sleep to help with mood and fatigue  5. Physical Recovery  - Discussed patients delivery and there were no complications - Patient had a 1st degree laceration, perineal healing reviewed. Patient expressed understanding - Patient has urinary incontinence? No - Patient is safe to resume physical and sexual activity  6.  Health Maintenance - Last pap smear done 08-26-2020 and was abnormal with ASCUS with positive HPV.  7. No Chronic Disease - PCP follow up  Brent Bulla MD [facility], [facility] 02/17/21

## 2021-03-17 ENCOUNTER — Ambulatory Visit (INDEPENDENT_AMBULATORY_CARE_PROVIDER_SITE_OTHER): Payer: Medicaid Other

## 2021-03-17 ENCOUNTER — Other Ambulatory Visit: Payer: Self-pay

## 2021-03-17 VITALS — BP 105/76 | HR 73 | Ht 69.0 in | Wt 183.0 lb

## 2021-03-17 DIAGNOSIS — N912 Amenorrhea, unspecified: Secondary | ICD-10-CM

## 2021-03-17 LAB — POCT URINE PREGNANCY: Preg Test, Ur: POSITIVE — AB

## 2021-03-17 NOTE — Progress Notes (Signed)
Ms. Wulf presents today for UPT. She has no unusual complaints. Pt took Plan B on 01/30/21. LMP: 01/30/21    OBJECTIVE: Appears well, in no apparent distress.   Home UPT Result: 03/08/21 In-Office UPT result: 03/17/21 I have reviewed the patient's medical, obstetrical, social, and family histories, and medications.   ASSESSMENT: Positive pregnancy test  PLAN Unplanned pregnancy. Pt will be terminating pregnancy.

## 2021-03-19 ENCOUNTER — Other Ambulatory Visit: Payer: Self-pay

## 2021-03-19 ENCOUNTER — Encounter (HOSPITAL_COMMUNITY): Payer: Self-pay | Admitting: Obstetrics and Gynecology

## 2021-03-19 ENCOUNTER — Inpatient Hospital Stay (HOSPITAL_COMMUNITY): Payer: Medicaid Other

## 2021-03-19 ENCOUNTER — Inpatient Hospital Stay (HOSPITAL_COMMUNITY)
Admission: AD | Admit: 2021-03-19 | Discharge: 2021-03-19 | Disposition: A | Discharge status: Home / Self Care | Payer: Medicaid Other | Attending: Obstetrics and Gynecology | Admitting: Obstetrics and Gynecology

## 2021-03-19 DIAGNOSIS — R102 Pelvic and perineal pain: Secondary | ICD-10-CM | POA: Insufficient documentation

## 2021-03-19 DIAGNOSIS — O26891 Other specified pregnancy related conditions, first trimester: Secondary | ICD-10-CM | POA: Insufficient documentation

## 2021-03-19 DIAGNOSIS — O418X10 Other specified disorders of amniotic fluid and membranes, first trimester, not applicable or unspecified: Secondary | ICD-10-CM

## 2021-03-19 DIAGNOSIS — Z3480 Encounter for supervision of other normal pregnancy, unspecified trimester: Secondary | ICD-10-CM

## 2021-03-19 DIAGNOSIS — O468X1 Other antepartum hemorrhage, first trimester: Secondary | ICD-10-CM

## 2021-03-19 DIAGNOSIS — Z3A01 Less than 8 weeks gestation of pregnancy: Secondary | ICD-10-CM | POA: Diagnosis not present

## 2021-03-19 DIAGNOSIS — O418X90 Other specified disorders of amniotic fluid and membranes, unspecified trimester, not applicable or unspecified: Secondary | ICD-10-CM | POA: Diagnosis present

## 2021-03-19 DIAGNOSIS — R87810 Cervical high risk human papillomavirus (HPV) DNA test positive: Secondary | ICD-10-CM

## 2021-03-19 DIAGNOSIS — O209 Hemorrhage in early pregnancy, unspecified: Secondary | ICD-10-CM | POA: Diagnosis not present

## 2021-03-19 DIAGNOSIS — O468X9 Other antepartum hemorrhage, unspecified trimester: Secondary | ICD-10-CM | POA: Diagnosis present

## 2021-03-19 DIAGNOSIS — Z3490 Encounter for supervision of normal pregnancy, unspecified, unspecified trimester: Secondary | ICD-10-CM

## 2021-03-19 DIAGNOSIS — O208 Other hemorrhage in early pregnancy: Secondary | ICD-10-CM | POA: Diagnosis not present

## 2021-03-19 DIAGNOSIS — Z8619 Personal history of other infectious and parasitic diseases: Secondary | ICD-10-CM

## 2021-03-19 DIAGNOSIS — R87610 Atypical squamous cells of undetermined significance on cytologic smear of cervix (ASC-US): Secondary | ICD-10-CM

## 2021-03-19 LAB — URINALYSIS, ROUTINE W REFLEX MICROSCOPIC
Bilirubin Urine: NEGATIVE
Glucose, UA: NEGATIVE mg/dL
Hgb urine dipstick: NEGATIVE
Ketones, ur: NEGATIVE mg/dL
Nitrite: NEGATIVE
Protein, ur: NEGATIVE mg/dL
Specific Gravity, Urine: 1.013 (ref 1.005–1.030)
pH: 6 (ref 5.0–8.0)

## 2021-03-19 LAB — WET PREP, GENITAL
Sperm: NONE SEEN
Trich, Wet Prep: NONE SEEN
Yeast Wet Prep HPF POC: NONE SEEN

## 2021-03-19 LAB — CBC
HCT: 37.9 % (ref 36.0–46.0)
Hemoglobin: 12.7 g/dL (ref 12.0–15.0)
MCH: 30.3 pg (ref 26.0–34.0)
MCHC: 33.5 g/dL (ref 30.0–36.0)
MCV: 90.5 fL (ref 80.0–100.0)
Platelets: 310 10*3/uL (ref 150–400)
RBC: 4.19 MIL/uL (ref 3.87–5.11)
RDW: 13.6 % (ref 11.5–15.5)
WBC: 6.1 10*3/uL (ref 4.0–10.5)
nRBC: 0 % (ref 0.0–0.2)

## 2021-03-19 LAB — HCG, QUANTITATIVE, PREGNANCY: hCG, Beta Chain, Quant, S: 28760 m[IU]/mL — ABNORMAL HIGH (ref <5)

## 2021-03-19 MED ORDER — CEPHALEXIN 500 MG PO CAPS: 500.0000 mg | ORAL_CAPSULE | Freq: Four times a day (QID) | ORAL | 0 refills | Status: DC

## 2021-03-19 NOTE — Discharge Instructions (Signed)
Subchorionic Hematoma  A hematoma is a collection of blood outside of the blood vessels. A subchorionic hematoma is a collection of blood between the outer wall of the embryo (chorion) and the inner wall of the uterus. This condition can cause vaginal bleeding. Early small hematomas usually shrink on their own and do not affect your baby or pregnancy. When bleeding starts later in pregnancy, or if the hematoma is larger or occurs in older pregnant women, the condition may be more serious. Larger hematomas increase the chances of miscarriage. This condition also increases the risk of:  Premature separation of the placenta from the uterus.  Premature (preterm) labor.  Stillbirth. What are the causes? The exact cause of this condition is not known. It occurs when blood is trapped between the placenta and the uterine wall because the placenta has separated from the original site of implantation. What increases the risk? You are more likely to develop this condition if:  You were treated with fertility medicines.  You became pregnant through in vitro fertilization (IVF). What are the signs or symptoms? Symptoms of this condition include:  Vaginal spotting or bleeding.  Abdominal pain. This is rare. Sometimes you may have no symptoms and the bleeding may only be seen when ultrasound images are taken (transvaginal ultrasound). How is this diagnosed? This condition is diagnosed based on a physical exam. This includes a pelvic exam. You may also have other tests, including:  Blood tests.  Urine tests.  Ultrasound of the abdomen. How is this treated? Treatment for this condition can vary. Treatment may include:  Watchful waiting. You will be monitored closely for any changes in bleeding.  Medicines.  Activity restriction. This may be needed until the bleeding stops.  A medicine called Rh immunoglobulin. This is given if you have an Rh-negative blood type. It prevents Rh  sensitization. Follow these instructions at home:  Stay on bed rest if told to do so by your health care provider.  Do not lift anything that is heavier than 10 lb (4.5 kg), or the limit that you are told by your health care provider.  Track and write down the number of pads you use each day and how soaked (saturated) they are.  Do not use tampons.  Keep all follow-up visits. This is important. Your health care provider may ask you to have follow-up blood tests or ultrasound tests or both. Contact a health care provider if:  You have any vaginal bleeding.  You have a fever. Get help right away if:  You have severe cramps in your stomach, back, abdomen, or pelvis.  You pass large clots or tissue. Save any tissue for your health care provider to look at.  You faint.  You become light-headed or weak. Summary  A subchorionic hematoma is a collection of blood between the outer wall of the embryo (chorion) and the inner wall of the uterus.  This condition can cause vaginal bleeding.  Sometimes you may have no symptoms and the bleeding may only be seen when ultrasound images are taken.  Treatment may include watchful waiting, medicines, or activity restriction.  Keep all follow-up visits. Get help right away if you have severe cramps or heavy vaginal bleeding. This information is not intended to replace advice given to you by your health care provider. Make sure you discuss any questions you have with your health care provider. Document Revised: 07/22/2020 Document Reviewed: 07/22/2020 Elsevier Patient Education  2021 [employer]. http://www.bray.com/.html">  First Trimester of Pregnancy  The first trimester of pregnancy starts on the first day of your last menstrual period until the end of week 12. This is also called months 1 through 3 of pregnancy. Body changes during your first trimester Your body goes through many changes during pregnancy. The changes  usually return to normal after your baby is born. Physical changes  You may gain or lose weight.  Your breasts may grow larger and hurt. The area around your nipples may get darker.  Dark spots or blotches may develop on your face.  You may have changes in your hair. Health changes  You may feel like you might vomit (nauseous), and you may vomit.  You may have heartburn.  You may have headaches.  You may have trouble pooping (constipation).  Your gums may bleed. Other changes  You may get tired easily.  You may pee (urinate) more often.  Your menstrual periods will stop.  You may not feel hungry.  You may want to eat certain kinds of food.  You may have changes in your emotions from day to day.  You may have more dreams. Follow these instructions at home: Medicines  Take over-the-counter and prescription medicines only as told by your doctor. Some medicines are not safe during pregnancy.  Take a prenatal vitamin that contains at least 600 micrograms (mcg) of folic acid. Eating and drinking  Eat healthy meals that include: ? Fresh fruits and vegetables. ? Whole grains. ? Good sources of protein, such as meat, eggs, or tofu. ? Low-fat dairy products.  Avoid raw meat and unpasteurized juice, milk, and cheese.  If you feel like you may vomit, or you vomit: ? Eat 4 or 5 small meals a day instead of 3 large meals. ? Try eating a few soda crackers. ? Drink liquids between meals instead of during meals.  You may need to take these actions to prevent or treat trouble pooping: ? Drink enough fluids to keep your pee (urine) pale yellow. ? Eat foods that are high in fiber. These include beans, whole grains, and fresh fruits and vegetables. ? Limit foods that are high in fat and sugar. These include fried or sweet foods. Activity  Exercise only as told by your doctor. Most people can do their usual exercise routine during pregnancy.  Stop exercising if you have  cramps or pain in your lower belly (abdomen) or low back.  Do not exercise if it is too hot or too humid, or if you are in a place of great height (high altitude).  Avoid heavy lifting.  If you choose to, you may have sex unless your doctor tells you not to. Relieving pain and discomfort  Wear a good support bra if your breasts are sore.  Rest with your legs raised (elevated) if you have leg cramps or low back pain.  If you have bulging veins (varicose veins) in your legs: ? Wear support hose as told by your doctor. ? Raise your feet for 15 minutes, 3-4 times a day. ? Limit salt in your food. Safety  Wear your seat belt at all times when you are in a car.  Talk with your doctor if someone is hurting you or yelling at you.  Talk with your doctor if you are feeling sad or have thoughts of hurting yourself. Lifestyle  Do not use hot tubs, steam rooms, or saunas.  Do not douche. Do not use tampons or scented sanitary pads.  Do not use herbal medicines, illegal drugs, or  medicines that are not approved by your doctor. Do not drink alcohol.  Do not smoke or use any products that contain nicotine or tobacco. If you need help quitting, ask your doctor.  Avoid cat litter boxes and soil that is used by cats. These carry germs that can cause harm to the baby and can cause a loss of your baby by miscarriage or stillbirth. General instructions  Keep all follow-up visits. This is important.  Ask for help if you need counseling or if you need help with nutrition. Your doctor can give you advice or tell you where to go for help.  Visit your dentist. At home, brush your teeth with a soft toothbrush. Floss gently.  Write down your questions. Take them to your prenatal visits. Where to find more information  American Pregnancy Association: americanpregnancy.org  [employer]: www.acog.org  Office on Women's Health:  MightyReward.co.nz Contact a doctor if:  You are dizzy.  You have a fever.  You have mild cramps or pressure in your lower belly.  You have a nagging pain in your belly area.  You continue to feel like you may vomit, you vomit, or you have watery poop (diarrhea) for 24 hours or longer.  You have a bad-smelling fluid coming from your vagina.  You have pain when you pee.  You are exposed to a disease that spreads from person to person, such as chickenpox, measles, Zika virus, HIV, or hepatitis. Get help right away if:  You have spotting or bleeding from your vagina.  You have very bad belly cramping or pain.  You have shortness of breath or chest pain.  You have any kind of injury, such as from a fall or a car crash.  You have new or increased pain, swelling, or redness in an arm or leg. Summary  The first trimester of pregnancy starts on the first day of your last menstrual period until the end of week 12 (months 1 through 3).  Eat 4 or 5 small meals a day instead of 3 large meals.  Do not smoke or use any products that contain nicotine or tobacco. If you need help quitting, ask your doctor.  Keep all follow-up visits. This information is not intended to replace advice given to you by your health care provider. Make sure you discuss any questions you have with your health care provider. Document Revised: 04/03/2020 Document Reviewed: 02/08/2020 Elsevier Patient Education  2021 [employer].

## 2021-03-19 NOTE — MAU Note (Signed)
Pt reports sharp pains in her back for a few days. Also having abdominal cramping. Has had some light bleeding when wiping.

## 2021-03-19 NOTE — MAU Provider Note (Signed)
Chief Complaint: Abdominal Pain and Back Pain   Event Date/Time   First Provider Initiated Contact with Patient 03/19/21 2010        SUBJECTIVE HPI: Carrie Nielsen is a 30 y.o. G4P3003 at [redacted]w[redacted]d by LMP who presents to maternity admissions reporting pain in her back and abdomen.. Had some light bleeding when wiping.  She denies vaginal itching/burning, urinary symptoms, h/a, dizziness, n/v, or fever/chills.    Abdominal Pain This is a new problem. The current episode started in the past 7 days. The problem occurs intermittently. The problem has been unchanged. The pain is mild. The quality of the pain is cramping. The abdominal pain does not radiate. Pertinent negatives include no constipation, diarrhea, dysuria, fever, myalgias, nausea or vomiting. Nothing aggravates the pain. The pain is relieved by nothing. She has tried nothing for the symptoms.  Back Pain This is a new problem. The current episode started in the past 7 days. The problem occurs intermittently. The problem is unchanged. The pain is mild. Associated symptoms include abdominal pain. Pertinent negatives include no dysuria, fever or numbness. She has tried nothing for the symptoms.   RN Note Pt reports sharp pains in her back for a few days. Also having abdominal cramping. Has had some light bleeding when wiping.   Past Medical History:  Diagnosis Date  . History of cold sores   . Medical history non-contributory   . Vaginal Pap smear, abnormal    Past Surgical History:  Procedure Laterality Date  . NO PAST SURGERIES    . WISDOM TOOTH EXTRACTION     Social History   Socioeconomic History  . Marital status: Single    Spouse name: Not on file  . Number of children: Not on file  . Years of education: Not on file  . Highest education level: Not on file  Occupational History  . Occupation: [employer]   Tobacco Use  . Smoking status: Never Smoker  . Smokeless tobacco: Never Used  Vaping Use  . Vaping Use: Never used   Substance and Sexual Activity  . Alcohol use: No    Alcohol/week: 0.0 standard drinks  . Drug use: No  . Sexual activity: Yes    Partners: Male  Other Topics Concern  . Not on file  Social History Narrative  . Not on file   Social Determinants of Health   Financial Resource Strain: Not on file  Food Insecurity: Not on file  Transportation Needs: Not on file  Physical Activity: Not on file  Stress: Not on file  Social Connections: Not on file  Intimate Partner Violence: Not on file   No current facility-administered medications on file prior to encounter.   No current outpatient medications on file prior to encounter.   No Known Allergies  I have reviewed patient's Past Medical Hx, Surgical Hx, Family Hx, Social Hx, medications and allergies.   ROS:  Review of Systems  Constitutional: Negative for fever.  Gastrointestinal: Positive for abdominal pain. Negative for constipation, diarrhea, nausea and vomiting.  Genitourinary: Negative for dysuria.  Musculoskeletal: Positive for back pain. Negative for myalgias.  Neurological: Negative for numbness.   Review of Systems  Other systems negative   Physical Exam  Physical Exam Patient Vitals for the past 24 hrs:  BP Temp src Pulse Resp SpO2 Height Weight  03/19/21 1534 114/65 Oral 87 18 99 % 5\' 9"  (1.753 m) 81.7 kg   Constitutional: Well-developed, well-nourished female in no acute distress.  Cardiovascular: normal rate Respiratory:  normal effort GI: Abd soft, non-tender. Pos BS x 4 MS: Extremities nontender, no edema, normal ROM Neurologic: Alert and oriented x 4.  GU: Neg CVAT.  LAB RESULTS Results for orders placed or performed during the hospital encounter of 03/19/21 (from the past 24 hour(s))  CBC     Status: None   Collection Time: 03/19/21  5:48 PM  Result Value Ref Range   WBC 6.1 4.0 - 10.5 K/uL   RBC 4.19 3.87 - 5.11 MIL/uL   Hemoglobin 12.7 12.0 - 15.0 g/dL   HCT 12.4 58.0 - 99.8 %   MCV 90.5 80.0  - 100.0 fL   MCH 30.3 26.0 - 34.0 pg   MCHC 33.5 30.0 - 36.0 g/dL   RDW 33.8 25.0 - 53.9 %   Platelets 310 150 - 400 K/uL   nRBC 0.0 0.0 - 0.2 %  hCG, quantitative, pregnancy     Status: Abnormal   Collection Time: 03/19/21  5:48 PM  Result Value Ref Range   hCG, Beta Chain, Quant, S 28,760 (H) <5 mIU/mL  Urinalysis, Routine w reflex microscopic     Status: Abnormal   Collection Time: 03/19/21  5:54 PM  Result Value Ref Range   Color, Urine YELLOW YELLOW   APPearance HAZY (A) CLEAR   Specific Gravity, Urine 1.013 1.005 - 1.030   pH 6.0 5.0 - 8.0   Glucose, UA NEGATIVE NEGATIVE mg/dL   Hgb urine dipstick NEGATIVE NEGATIVE   Bilirubin Urine NEGATIVE NEGATIVE   Ketones, ur NEGATIVE NEGATIVE mg/dL   Protein, ur NEGATIVE NEGATIVE mg/dL   Nitrite NEGATIVE NEGATIVE   Leukocytes,Ua LARGE (A) NEGATIVE   RBC / HPF 0-5 0 - 5 RBC/hpf   WBC, UA 21-50 0 - 5 WBC/hpf   Bacteria, UA FEW (A) NONE SEEN   Squamous Epithelial / LPF 11-20 0 - 5   Mucus PRESENT     --/--/O POS (01/26 0445)  IMAGING US OB Comp Less 14 Wks  Result Date: 03/19/2021 CLINICAL DATA:  Pelvic pain EXAM: OBSTETRIC <14 WK ULTRASOUND TECHNIQUE: Transabdominal ultrasound was performed for evaluation of the gestation as well as the maternal uterus and adnexal regions. COMPARISON:  None. FINDINGS: Intrauterine gestational sac: Single Yolk sac:  Visualized Embryo:  Visualized Cardiac Activity: Visualized Heart Rate: 111 bpm CRL: 4.1 mm   6 w 1 d                  [place] EDC: 11/11/2021 Subchorionic hemorrhage: Small subchorionic hemorrhage along the posterior sac. Maternal uterus/adnexae: Ovaries are within normal limits. Right ovary measures 2.5 x 2.4 by 1.8 cm. Left ovary measures 3.8 x 1.3 x 2.8 cm. No significant free fluid IMPRESSION: 1. Single viable intrauterine pregnancy as above. 2. Small subchorionic hemorrhage Electronically Signed   By: Jasmine Pang M.D.   On: 03/19/2021 19:54    MAU Management/MDM: Ordered usual first  trimester r/o ectopic labs.   Will check baseline Ultrasound to rule out ectopic.  This bleeding/pain can represent a normal pregnancy with bleeding, spontaneous abortion or even an ectopic which can be life-threatening.  The process as listed above helps to determine which of these is present.  Reviewed results of [place], reassured that small [facility] is not typically associated with miscarriage. Has OB appts already set up at [facility]  ASSESSMENT [place] IUP at [redacted]w[redacted]d Small subchorionic hemorrhage Pelvic cramping Live IUP  PLAN Discharge home Followup in office as scheduled Pt stable at time of discharge. Encouraged to return here if she  develops worsening of symptoms, increase in pain, fever, or other concerning symptoms.    Wynelle Bourgeois CNM, MSN Certified Nurse-Midwife 03/19/2021  8:10 PM

## 2021-03-20 LAB — GC/CHLAMYDIA PROBE AMP (~~LOC~~) NOT AT ARMC
Chlamydia: NEGATIVE
Comment: NEGATIVE
Comment: NORMAL
Neisseria Gonorrhea: NEGATIVE

## 2021-03-21 LAB — CULTURE, OB URINE

## 2021-05-15 ENCOUNTER — Ambulatory Visit (INDEPENDENT_AMBULATORY_CARE_PROVIDER_SITE_OTHER): Payer: Medicaid Other

## 2021-05-15 ENCOUNTER — Other Ambulatory Visit: Payer: Self-pay

## 2021-05-15 DIAGNOSIS — Z3202 Encounter for pregnancy test, result negative: Secondary | ICD-10-CM | POA: Diagnosis not present

## 2021-05-15 LAB — POCT URINE PREGNANCY: Preg Test, Ur: NEGATIVE

## 2021-05-15 NOTE — Progress Notes (Addendum)
Ms. Covino presents today for UPT. She has no unusual complaints and had TAB in May she have been bleeding since.  LMP: unknown    OBJECTIVE: Appears well, in no apparent distress.  OB History     Gravida  4   Para  3   Term  3   Preterm      AB      Living  3      SAB      IAB      Ectopic      Multiple  0   Live Births  3          Home UPT Result:UNSURE In-Office UPT result: NEGATIVE  I have reviewed the patient's medical, obstetrical, social, and family histories, and medications.   ASSESSMENT: NEGATIVE pregnancy test  PLAN Follow up prn  Patient was assessed and managed by nursing staff during this encounter. I have reviewed the chart and agree with the documentation and plan. I have also made any necessary editorial changes.  Coral Ceo, MD 05/15/2021 1:04 PM

## 2021-05-16 ENCOUNTER — Ambulatory Visit: Payer: Medicaid Other | Admitting: Obstetrics

## 2021-05-21 ENCOUNTER — Ambulatory Visit (INDEPENDENT_AMBULATORY_CARE_PROVIDER_SITE_OTHER): Payer: Medicaid Other | Admitting: Obstetrics

## 2021-05-21 ENCOUNTER — Encounter: Payer: Self-pay | Admitting: Obstetrics

## 2021-05-21 ENCOUNTER — Other Ambulatory Visit: Payer: Self-pay

## 2021-05-21 ENCOUNTER — Other Ambulatory Visit (HOSPITAL_COMMUNITY)
Admission: RE | Admit: 2021-05-21 | Discharge: 2021-05-21 | Disposition: A | Discharge status: Home / Self Care | Payer: Medicaid Other | Source: Ambulatory Visit | Attending: Obstetrics | Admitting: Obstetrics

## 2021-05-21 VITALS — BP 106/64 | HR 66 | Wt 167.0 lb

## 2021-05-21 DIAGNOSIS — N898 Other specified noninflammatory disorders of vagina: Secondary | ICD-10-CM | POA: Diagnosis not present

## 2021-05-21 DIAGNOSIS — B9689 Other specified bacterial agents as the cause of diseases classified elsewhere: Secondary | ICD-10-CM

## 2021-05-21 DIAGNOSIS — N760 Acute vaginitis: Secondary | ICD-10-CM

## 2021-05-21 MED ORDER — METRONIDAZOLE 500 MG PO TABS: 500.0000 mg | ORAL_TABLET | Freq: Two times a day (BID) | ORAL | 2 refills | Status: DC

## 2021-05-21 NOTE — Progress Notes (Signed)
Pt states she is having vaginal d/c and would like full swab today.

## 2021-05-21 NOTE — Progress Notes (Deleted)
Subjective:  Carrie Nielsen is a 30 y.o. G4P3003 at [redacted]w[redacted]d being seen today for ongoing prenatal care.  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  Patient reports  malodorous vaginal discharge .   .  .   . Denies leaking of fluid.   The following portions of the patient's history were reviewed and updated as appropriate: allergies, current medications, past family history, past medical history, past social history, past surgical history and problem list. Problem list updated.  Objective:   Vitals:   05/21/21 1120  BP: 106/64  Pulse: 66  Weight: 167 lb (75.8 kg)    Fetal Status:           General:  Alert, oriented and cooperative. Patient is in no acute distress.  Skin: Skin is warm and dry. No rash noted.   Cardiovascular: Normal heart rate noted  Respiratory: Normal respiratory effort, no problems with respiration noted  Abdomen: Soft, gravid, appropriate for gestational age.       Pelvic:  Cervical exam deferred        Extremities: Normal range of motion.     Mental Status: Normal mood and affect. Normal behavior. Normal judgment and thought content.   Urinalysis:      Assessment and Plan:  Pregnancy: G4P3003 at [redacted]w[redacted]d  1. Vaginal discharge Rx: - Cervicovaginal ancillary only( [place])  2. BV (bacterial vaginosis) Rx: - metroNIDAZOLE (FLAGYL) 500 MG tablet; Take 1 tablet (500 mg total) by mouth 2 (two) times daily.  Dispense: 14 tablet; Refill: 2   Preterm labor symptoms and general obstetric precautions including but not limited to vaginal  bleeding, contractions, leaking of fluid and fetal movement were reviewed in detail with the patient. Please refer to After Visit Summary for other counseling recommendations.   Return in about 4 weeks (around 06/18/2021) for ROB.   Brock Bad, MD  05/21/21

## 2021-05-21 NOTE — Progress Notes (Signed)
Patient ID: Carrie Nielsen, female   DOB: [birth year], 30 y.o.   MRN: [id number]  Chief Complaint  Patient presents with   Gynecologic Exam    HPI Carrie Nielsen is a 30 y.o. female.  Complains of malodorous vaginal discharge. HPI  Past Medical History:  Diagnosis Date   History of cold sores    Medical history non-contributory    Vaginal Pap smear, abnormal     Past Surgical History:  Procedure Laterality Date   NO PAST SURGERIES     WISDOM TOOTH EXTRACTION      Family History  Problem Relation Age of Onset   Cancer Maternal Grandfather        brain   Alcohol abuse Neg Hx    Arthritis Neg Hx    Asthma Neg Hx    Birth defects Neg Hx    COPD Neg Hx    Depression Neg Hx    Diabetes Neg Hx    Early death Neg Hx    Drug abuse Neg Hx    Hearing loss Neg Hx    Heart disease Neg Hx    Hyperlipidemia Neg Hx    Hypertension Neg Hx    Kidney disease Neg Hx    Learning disabilities Neg Hx    Mental illness Neg Hx    Mental retardation Neg Hx    Miscarriages / Stillbirths Neg Hx    Stroke Neg Hx    Vision loss Neg Hx     Social History Social History   Tobacco Use   Smoking status: Never   Smokeless tobacco: Never  Vaping Use   Vaping Use: Never used  Substance Use Topics   Alcohol use: No    Alcohol/week: 0.0 standard drinks   Drug use: No    No Known Allergies  Current Outpatient Medications  Medication Sig Dispense Refill   metroNIDAZOLE (FLAGYL) 500 MG tablet Take 1 tablet (500 mg total) by mouth 2 (two) times daily. 14 tablet 2   No current facility-administered medications for this visit.    Review of Systems Review of Systems Constitutional: negative for fatigue and weight loss Respiratory: negative for cough and wheezing Cardiovascular: negative for chest pain, fatigue and palpitations Gastrointestinal: negative for abdominal pain and change in bowel habits Genitourinary: positive for malodorous vaginal discharge Integument/breast: negative for  nipple discharge Musculoskeletal:negative for myalgias Neurological: negative for gait problems and tremors Behavioral/Psych: negative for abusive relationship, depression Endocrine: negative for temperature intolerance      Blood pressure 106/64, pulse 66, weight 167 lb (75.8 kg), last menstrual period 01/30/2021, not currently breastfeeding.  Physical Exam Physical Exam General:   Alert and no distress  Skin:   no rash or abnormalities  Lungs:   clear to auscultation bilaterally  Heart:   regular rate and rhythm, S1, S2 normal, no murmur, click, rub or gallop  Breasts:   Not examined  Abdomen:  normal findings: no organomegaly, soft, non-tender and no hernia  Pelvis:  External genitalia: normal general appearance Urinary system: urethral meatus normal and bladder without fullness, nontender Vaginal: normal without tenderness, induration or masses Cervix: normal appearance Adnexa: normal bimanual exam Uterus: anteverted and non-tender, normal size    I have spent a total of 15 minutes of face-to-face time, excluding clinical staff time, reviewing notes and preparing to see patient, ordering tests and/or medications, and counseling the patient.   Data Reviewed Wet Prep  Assessment     1. Vaginal discharge Rx: - Cervicovaginal ancillary only( [place])  2. BV (bacterial vaginosis) Rx: - metroNIDAZOLE (FLAGYL) 500 MG tablet; Take 1 tablet (500 mg total) by mouth 2 (two) times daily.  Dispense: 14 tablet; Refill: 2     Plan    No orders of the defined types were placed in this encounter.  Meds ordered this encounter  Medications   metroNIDAZOLE (FLAGYL) 500 MG tablet    Sig: Take 1 tablet (500 mg total) by mouth 2 (two) times daily.    Dispense:  14 tablet    Refill:  2      Brock Bad, MD 05/21/2021 2:08 PM

## 2021-05-23 LAB — CERVICOVAGINAL ANCILLARY ONLY
Bacterial Vaginitis (gardnerella): POSITIVE — AB
Candida Glabrata: NEGATIVE
Candida Vaginitis: NEGATIVE
Chlamydia: NEGATIVE
Comment: NEGATIVE
Comment: NEGATIVE
Comment: NEGATIVE
Comment: NEGATIVE
Comment: NEGATIVE
Comment: NORMAL
Neisseria Gonorrhea: NEGATIVE
Trichomonas: NEGATIVE

## 2021-05-27 ENCOUNTER — Other Ambulatory Visit: Payer: Self-pay

## 2021-05-27 ENCOUNTER — Ambulatory Visit (INDEPENDENT_AMBULATORY_CARE_PROVIDER_SITE_OTHER): Payer: Medicaid Other | Admitting: Obstetrics

## 2021-05-27 VITALS — BP 120/73 | HR 72 | Wt 167.0 lb

## 2021-05-27 DIAGNOSIS — B9689 Other specified bacterial agents as the cause of diseases classified elsewhere: Secondary | ICD-10-CM

## 2021-05-27 DIAGNOSIS — N760 Acute vaginitis: Secondary | ICD-10-CM | POA: Diagnosis not present

## 2021-05-27 NOTE — Progress Notes (Signed)
Per notes pt is here for F/U /Consult recently seen 05/21/21 for vaginal discharge. +BV  Per pt needs Auth to have procedure done. To have bartholin gland removed In order for procedure to be covered.

## 2021-05-27 NOTE — Progress Notes (Signed)
Patient ID: Carrie Nielsen, female   DOB: 09/13/1991, 30 y.o.   MRN: [id number]  Chief Complaint  Patient presents with   Consult    HPI Carrie Nielsen is a 30 y.o. female.  Presents for discussion of chronic BV, etiology, treatment, and ways to prevent reinfection  HPI  Past Medical History:  Diagnosis Date   History of cold sores    Medical history non-contributory    Vaginal Pap smear, abnormal     Past Surgical History:  Procedure Laterality Date   NO PAST SURGERIES     WISDOM TOOTH EXTRACTION      Family History  Problem Relation Age of Onset   Cancer Maternal Grandfather        brain   Alcohol abuse Neg Hx    Arthritis Neg Hx    Asthma Neg Hx    Birth defects Neg Hx    COPD Neg Hx    Depression Neg Hx    Diabetes Neg Hx    Early death Neg Hx    Drug abuse Neg Hx    Hearing loss Neg Hx    Heart disease Neg Hx    Hyperlipidemia Neg Hx    Hypertension Neg Hx    Kidney disease Neg Hx    Learning disabilities Neg Hx    Mental illness Neg Hx    Mental retardation Neg Hx    Miscarriages / Stillbirths Neg Hx    Stroke Neg Hx    Vision loss Neg Hx     Social History Social History   Tobacco Use   Smoking status: Never   Smokeless tobacco: Never  Vaping Use   Vaping Use: Never used  Substance Use Topics   Alcohol use: No    Alcohol/week: 0.0 standard drinks   Drug use: No    No Known Allergies  Current Outpatient Medications  Medication Sig Dispense Refill   metroNIDAZOLE (FLAGYL) 500 MG tablet Take 1 tablet (500 mg total) by mouth 2 (two) times daily. 14 tablet 2   No current facility-administered medications for this visit.    Review of Systems Review of Systems Constitutional: negative for fatigue and weight loss Respiratory: negative for cough and wheezing Cardiovascular: negative for chest pain, fatigue and palpitations Gastrointestinal: negative for abdominal pain and change in bowel habits Genitourinary:negative Integument/breast:  negative for nipple discharge Musculoskeletal:negative for myalgias Neurological: negative for gait problems and tremors Behavioral/Psych: negative for abusive relationship, depression Endocrine: negative for temperature intolerance      Blood pressure 120/73, pulse 72, weight 167 lb (75.8 kg), last menstrual period 01/30/2021, not currently breastfeeding.  Physical Exam Physical Exam General:   Alert and no distress  Skin:   no rash or abnormalities  Lungs:   clear to auscultation bilaterally  Heart:   regular rate and rhythm, S1, S2 normal, no murmur, click, rub or gallop  The remainder of the physical exam deferred due to the nature of the encounter  I have spent a total of 15 minutes of face-to-face time, excluding clinical staff time, reviewing notes and preparing to see patient, ordering tests and/or medications, and counseling the patient. .   Data Reviewed Wet Prep Cultures  Assessment     1. BV (bacterial vaginosis) - discussed the causes of BV, treatment, and ways to prevent reinfection     Plan  Follow up prn    Brock Bad, MD 05/27/2021 4:44 PM

## 2021-06-02 ENCOUNTER — Other Ambulatory Visit: Payer: Self-pay | Admitting: Obstetrics

## 2021-06-04 ENCOUNTER — Encounter: Payer: Self-pay | Admitting: Obstetrics and Gynecology

## 2021-06-04 ENCOUNTER — Ambulatory Visit (INDEPENDENT_AMBULATORY_CARE_PROVIDER_SITE_OTHER): Payer: Medicaid Other | Admitting: Obstetrics and Gynecology

## 2021-06-04 ENCOUNTER — Other Ambulatory Visit: Payer: Self-pay

## 2021-06-04 VITALS — BP 120/74 | HR 81 | Ht 70.0 in | Wt 160.0 lb

## 2021-06-04 DIAGNOSIS — N750 Cyst of Bartholin's gland: Secondary | ICD-10-CM

## 2021-06-04 DIAGNOSIS — N926 Irregular menstruation, unspecified: Secondary | ICD-10-CM

## 2021-06-04 LAB — POCT URINE PREGNANCY: Preg Test, Ur: NEGATIVE

## 2021-06-04 NOTE — Patient Instructions (Signed)
Bartholin's Cyst  A Bartholin's cyst is a fluid-filled sac that forms on a Bartholin's gland. Bartholin's glands are small glands in the folds of skin near the opening of the vagina (labia). This type of cyst causes a bulge or lump near the opening of the vagina. If you have a cyst that is small and not infected, you may be able to take care of it at home. If your cyst gets infected, it may cause pain and your doctormay need to drain it. What are the causes? This condition may be caused by a blocked Bartholin's gland. Germs (bacteria) inside of the cyst can cause an infection. What are the signs or symptoms? A bulge or lump near the opening of the vagina. Discomfort or pain. Redness, swelling, or fluid draining from the area. How is this treated? You may not need treatment if your cyst is not causing symptoms. The cyst cango away on its own with home care. Home care includes hot baths or heat therapy. Large cysts or cysts that are infected may be treated with: Antibiotic medicine. A procedure to drain the fluid. Cysts that keep coming back will need to be drained many times. Your doctor maytalk to you about surgery to remove the cyst. Follow these instructions at home: Medicines Take over-the-counter and prescription medicines only as told by your doctor. If you were prescribed an antibiotic medicine, take it as told by your doctor. Do not stop taking it even if you start to feel better. Managing pain and swelling Try sitz baths to help with pain and swelling. A sitz bath is a warm water bath in which the water only comes up to your hips and should cover your buttocks. You may take sitz baths a few times a day. If told, put heat on the affected area as often as needed. Use the heat source that your doctor recommends, such as a moist heat pack or a heating pad. Place a towel between your skin and the heat source. Leave the heat on for 20-30 minutes. Take off the heat if your skin turns bright  red. This is very important. If you cannot feel pain, heat, or cold, you have a greater risk of getting burned. General instructions If your cyst was drained: Follow instructions from your doctor about how to take care of your wound. Use feminine pads to absorb any fluid. Do not push on or squeeze your cyst. Do not have sex until the cyst has gone away or your wound from drainage has healed. Take these steps to help prevent a cyst from returning, and to prevent other cysts from forming: Take a bath or shower once a day. Clean the area around your vagina with mild soap and water when you bathe. Practice safe sex to prevent STIs. Talk with your doctor about how to prevent STIs and which forms of birth control to use. Keep all follow-up visits. Contact a doctor if: You have a fever. You get more redness, swelling, or pain around your cyst. You have fluid, blood, pus, or a bad smell coming from your cyst. You have a cyst that gets larger or a cyst that comes back. Summary A Bartholin's cyst is a fluid-filled sac that forms on a Bartholin's gland. These small glands are found in the folds of skin near the opening of the vagina (labia). This type of cyst causes a bulge or lump near the opening of the vagina. Try sitz baths a few times a day to help with pain   and swelling. Do not push on or squeeze your cyst. This information is not intended to replace advice given to you by your health care provider. Make sure you discuss any questions you have with your healthcare provider. Document Revised: 03/25/2020 Document Reviewed: 03/25/2020 Elsevier Patient Education  2022 [employer]  

## 2021-06-04 NOTE — Progress Notes (Signed)
Carrie Nielsen presents in referral from Dr Clearance Coots for consideration of removal of left Bartholin cyst. Chronic problem now for over several years. Last infection over 1 yr ago. H/O I & D. Pain with IC and problems with BV infections as well Desires to have removed  PE AF VSS Lungs clear Herat RRR Abd soft, + BS GU, enlarged left bartholin cyst @ 2 x 2 cm, slightly tender to touch, no evidence of infection.  A/P Chronic Left Bartholin Cyst  Cyst/gland  removal reviewed with pt. R/B/Post op care reviewed. Pt desires to proceed. Will schedule and f/u with post op appt.

## 2021-06-04 NOTE — H&P (View-Only) (Signed)
Ms Agrusa presents in referral from Dr Harper for consideration of removal of left Bartholin cyst. Chronic problem now for over several years. Last infection over 1 yr ago. H/O I & D. Pain with IC and problems with BV infections as well Desires to have removed  PE AF VSS Lungs clear Herat RRR Abd soft, + BS GU, enlarged left bartholin cyst @ 2 x 2 cm, slightly tender to touch, no evidence of infection.  A/P Chronic Left Bartholin Cyst  Cyst/gland  removal reviewed with pt. R/B/Post op care reviewed. Pt desires to proceed. Will schedule and f/u with post op appt.  

## 2021-06-04 NOTE — Progress Notes (Signed)
Pt is here today to discuss surgery for bartholin gland removal. LMP: 05/02/21. BCM: none currently.

## 2021-06-05 ENCOUNTER — Telehealth: Payer: Self-pay | Admitting: *Deleted

## 2021-06-05 ENCOUNTER — Encounter: Payer: Self-pay | Admitting: *Deleted

## 2021-06-05 NOTE — Telephone Encounter (Signed)
Call to patient regarding surgery date options. Left message to see if 06-24-21 will work for patient. Plan for this date if patient agreeable.

## 2021-06-10 NOTE — Telephone Encounter (Signed)
Surgery time changed to 1200 on 06-24-21.Need arrival at 10:00 am.  Call to patient- mobile number- c"all cannot be completed as this time" X 2 attempts. Call home number- voice mail message says "This is Stanton Kidney." No message left.  New letter with updated time mailed to patient. Is not active on My Chart.

## 2021-06-19 ENCOUNTER — Encounter (HOSPITAL_BASED_OUTPATIENT_CLINIC_OR_DEPARTMENT_OTHER): Payer: Self-pay | Admitting: Obstetrics and Gynecology

## 2021-06-19 ENCOUNTER — Other Ambulatory Visit: Payer: Self-pay

## 2021-06-19 DIAGNOSIS — N750 Cyst of Bartholin's gland: Secondary | ICD-10-CM

## 2021-06-19 DIAGNOSIS — Z8619 Personal history of other infectious and parasitic diseases: Secondary | ICD-10-CM

## 2021-06-19 HISTORY — DX: Cyst of Bartholin's gland: N75.0

## 2021-06-19 HISTORY — DX: Personal history of other infectious and parasitic diseases: Z86.19

## 2021-06-19 NOTE — Progress Notes (Addendum)
Spoke w/ via phone for pre-op interview---pt Lab needs dos----    cbc, urin preg           Lab results------none COVID test -----patient states asymptomatic no test needed Arrive at -------530 am 06-24-2021 NPO after MN NO Solid Food.  Clear liquids from MN until---430 am then npo Med rec completed Medications to take morning of surgery -----none Diabetic medication -----n/a Patient instructed no nail polish to be worn day of surgery Patient instructed to bring photo id and insurance card day of surgery Patient aware to have Driver (ride ) / caregiver  sister Carrie Nielsen   for 24 hours after surgery  Patient Special Instructions -----none Pre-Op special Istructions -----none Patient verbalized understanding of instructions that were given at this phone interview. Patient denies shortness of breath, chest pain, fever, cough at this phone interview.

## 2021-06-19 NOTE — H&P (Signed)
Denyce Nielsen is an 30 y.o. female with chronic Bartholin cyst. Desires definite therapy.    Menstrual History: Menarche age: 89 Patient's last menstrual period was 06/01/2021.    Past Medical History:  Diagnosis Date   Bartholin cyst 06/19/2021   History of cold sores 06/19/2021   Vaginal Pap smear, abnormal 11/08/2020    Past Surgical History:  Procedure Laterality Date   NO PAST SURGERIES     WISDOM TOOTH EXTRACTION     yrs ago    Family History  Problem Relation Age of Onset   Cancer Maternal Grandfather        brain   Alcohol abuse Neg Hx    Arthritis Neg Hx    Asthma Neg Hx    Birth defects Neg Hx    COPD Neg Hx    Depression Neg Hx    Diabetes Neg Hx    Early death Neg Hx    Drug abuse Neg Hx    Hearing loss Neg Hx    Heart disease Neg Hx    Hyperlipidemia Neg Hx    Hypertension Neg Hx    Kidney disease Neg Hx    Learning disabilities Neg Hx    Mental illness Neg Hx    Mental retardation Neg Hx    Miscarriages / Stillbirths Neg Hx    Stroke Neg Hx    Vision loss Neg Hx     Social History:  reports that she has never smoked. She has never used smokeless tobacco. She reports that she does not drink alcohol and does not use drugs.  Allergies: No Known Allergies  No medications prior to admission.    Review of Systems  Constitutional: Negative.   Cardiovascular: Negative.   Gastrointestinal: Negative.   Genitourinary:  Positive for vaginal pain.   Height 5\' 10"  (1.778 m), weight 68 kg, last menstrual period 06/01/2021, unknown if currently breastfeeding. Physical Exam Constitutional:      Appearance: Normal appearance.  Cardiovascular:     Rate and Rhythm: Normal rate and regular rhythm.  Pulmonary:     Effort: Pulmonary effort is normal.     Breath sounds: Normal breath sounds.  Abdominal:     General: Bowel sounds are normal.     Palpations: Abdomen is soft.  Genitourinary:    Comments: 2 cm x 2 cm bartholin cyst with out evidence of  infection Neurological:     Mental Status: She is alert.    No results found for this or any previous visit (from the past 24 hour(s)).  No results found.  Assessment/Plan: Chronic Bartholin cyst  Excision of cyst and gland reviewed with pt. R/B/potential for scar tissue , recurrence and post op care reviewed with pt. Pt has verbalized understanding and desires to proceed.   06/03/2021 06/19/2021, 4:58 PM

## 2021-06-23 MED ORDER — DOXYCYCLINE HYCLATE 100 MG IV SOLR
200.0000 mg | INTRAVENOUS | Status: AC
  Administered 2021-06-24: 200 mg via INTRAVENOUS
  Filled 2021-06-23: qty 200

## 2021-06-24 ENCOUNTER — Other Ambulatory Visit: Payer: Self-pay

## 2021-06-24 ENCOUNTER — Ambulatory Visit (HOSPITAL_BASED_OUTPATIENT_CLINIC_OR_DEPARTMENT_OTHER): Payer: Medicaid Other | Admitting: Anesthesiology

## 2021-06-24 ENCOUNTER — Encounter (HOSPITAL_BASED_OUTPATIENT_CLINIC_OR_DEPARTMENT_OTHER)
Admission: RE | Disposition: A | Discharge status: Home / Self Care | Payer: Self-pay | Source: Home / Self Care | Attending: Obstetrics and Gynecology

## 2021-06-24 ENCOUNTER — Ambulatory Visit (HOSPITAL_BASED_OUTPATIENT_CLINIC_OR_DEPARTMENT_OTHER)
Admission: RE | Admit: 2021-06-24 | Discharge: 2021-06-24 | Disposition: A | Discharge status: Home / Self Care | Payer: Medicaid Other | Attending: Obstetrics and Gynecology | Admitting: Obstetrics and Gynecology

## 2021-06-24 ENCOUNTER — Encounter (HOSPITAL_BASED_OUTPATIENT_CLINIC_OR_DEPARTMENT_OTHER): Payer: Self-pay | Admitting: Obstetrics and Gynecology

## 2021-06-24 DIAGNOSIS — N750 Cyst of Bartholin's gland: Secondary | ICD-10-CM | POA: Insufficient documentation

## 2021-06-24 HISTORY — PX: BARTHOLIN GLAND CYST EXCISION: SHX565

## 2021-06-24 LAB — CBC
HCT: 38.7 % (ref 36.0–46.0)
Hemoglobin: 13 g/dL (ref 12.0–15.0)
MCH: 30.4 pg (ref 26.0–34.0)
MCHC: 33.6 g/dL (ref 30.0–36.0)
MCV: 90.4 fL (ref 80.0–100.0)
Platelets: 247 10*3/uL (ref 150–400)
RBC: 4.28 MIL/uL (ref 3.87–5.11)
RDW: 13.3 % (ref 11.5–15.5)
WBC: 5.4 10*3/uL (ref 4.0–10.5)
nRBC: 0 % (ref 0.0–0.2)

## 2021-06-24 LAB — POCT PREGNANCY, URINE: Preg Test, Ur: NEGATIVE

## 2021-06-24 SURGERY — EXCISION, BARTHOLIN'S GLAND
Anesthesia: General | Site: Perineum

## 2021-06-24 MED ORDER — OXYCODONE HCL 5 MG/5ML PO SOLN: 5.0000 mg | Freq: Once | ORAL | Status: DC | PRN

## 2021-06-24 MED ORDER — FENTANYL CITRATE (PF) 100 MCG/2ML IJ SOLN
INTRAMUSCULAR | Status: DC | PRN
  Administered 2021-06-24: 25 ug via INTRAVENOUS
  Administered 2021-06-24: 50 ug via INTRAVENOUS
  Administered 2021-06-24 (×3): 25 ug via INTRAVENOUS
  Administered 2021-06-24: 50 ug via INTRAVENOUS

## 2021-06-24 MED ORDER — FENTANYL CITRATE (PF) 100 MCG/2ML IJ SOLN
INTRAMUSCULAR | Status: AC
  Filled 2021-06-24: qty 2

## 2021-06-24 MED ORDER — LIDOCAINE HCL (PF) 2 % IJ SOLN
INTRAMUSCULAR | Status: AC
  Filled 2021-06-24: qty 5

## 2021-06-24 MED ORDER — IBUPROFEN 800 MG PO TABS: 800.0000 mg | ORAL_TABLET | Freq: Three times a day (TID) | ORAL | 0 refills | Status: DC | PRN

## 2021-06-24 MED ORDER — DEXAMETHASONE SODIUM PHOSPHATE 10 MG/ML IJ SOLN
INTRAMUSCULAR | Status: DC | PRN
  Administered 2021-06-24: 10 mg via INTRAVENOUS

## 2021-06-24 MED ORDER — ACETAMINOPHEN 10 MG/ML IV SOLN: 1000.0000 mg | Freq: Once | INTRAVENOUS | Status: DC | PRN

## 2021-06-24 MED ORDER — ACETAMINOPHEN 325 MG PO TABS: 325.0000 mg | ORAL_TABLET | ORAL | Status: DC | PRN

## 2021-06-24 MED ORDER — KETOROLAC TROMETHAMINE 15 MG/ML IJ SOLN: 15.0000 mg | INTRAMUSCULAR | Status: DC

## 2021-06-24 MED ORDER — AMISULPRIDE (ANTIEMETIC) 5 MG/2ML IV SOLN
10.0000 mg | Freq: Once | INTRAVENOUS | Status: DC | PRN
Start: 2021-06-24 — End: 2021-06-24

## 2021-06-24 MED ORDER — PROPOFOL 10 MG/ML IV BOLUS
INTRAVENOUS | Status: DC | PRN
  Administered 2021-06-24: 200 mg via INTRAVENOUS

## 2021-06-24 MED ORDER — ONDANSETRON HCL 4 MG/2ML IJ SOLN
INTRAMUSCULAR | Status: DC | PRN
  Administered 2021-06-24: 4 mg via INTRAVENOUS

## 2021-06-24 MED ORDER — LIDOCAINE HCL (CARDIAC) PF 100 MG/5ML IV SOSY
PREFILLED_SYRINGE | INTRAVENOUS | Status: DC | PRN
  Administered 2021-06-24: 60 mg via INTRAVENOUS

## 2021-06-24 MED ORDER — PROMETHAZINE HCL 25 MG/ML IJ SOLN: 6.2500 mg | INTRAMUSCULAR | Status: DC | PRN

## 2021-06-24 MED ORDER — 0.9 % SODIUM CHLORIDE (POUR BTL) OPTIME
TOPICAL | Status: DC | PRN
  Administered 2021-06-24: 500 mL

## 2021-06-24 MED ORDER — OXYCODONE HCL 5 MG PO TABS: 5.0000 mg | ORAL_TABLET | Freq: Four times a day (QID) | ORAL | 0 refills | Status: DC | PRN

## 2021-06-24 MED ORDER — LACTATED RINGERS IV SOLN
INTRAVENOUS | Status: DC
  Administered 2021-06-24: 1000 mL via INTRAVENOUS

## 2021-06-24 MED ORDER — SCOPOLAMINE 1 MG/3DAYS TD PT72: 1.0000 | MEDICATED_PATCH | TRANSDERMAL | Status: DC

## 2021-06-24 MED ORDER — LACTATED RINGERS IV SOLN: INTRAVENOUS | Status: DC

## 2021-06-24 MED ORDER — ACETAMINOPHEN 500 MG PO TABS
1000.0000 mg | ORAL_TABLET | ORAL | Status: AC
  Administered 2021-06-24: 1000 mg via ORAL

## 2021-06-24 MED ORDER — KETOROLAC TROMETHAMINE 30 MG/ML IJ SOLN
INTRAMUSCULAR | Status: DC | PRN
  Administered 2021-06-24: 30 mg via INTRAVENOUS

## 2021-06-24 MED ORDER — LIDOCAINE HCL 1 % IJ SOLN
INTRAMUSCULAR | Status: DC | PRN
  Administered 2021-06-24: 20 mL

## 2021-06-24 MED ORDER — ACETAMINOPHEN 500 MG PO TABS
ORAL_TABLET | ORAL | Status: AC
  Filled 2021-06-24: qty 2

## 2021-06-24 MED ORDER — FENTANYL CITRATE (PF) 100 MCG/2ML IJ SOLN
25.0000 ug | INTRAMUSCULAR | Status: DC | PRN
  Administered 2021-06-24: 50 ug via INTRAVENOUS

## 2021-06-24 MED ORDER — MIDAZOLAM HCL 5 MG/5ML IJ SOLN
INTRAMUSCULAR | Status: DC | PRN
  Administered 2021-06-24: 2 mg via INTRAVENOUS

## 2021-06-24 MED ORDER — ACETAMINOPHEN 160 MG/5ML PO SOLN: 325.0000 mg | ORAL | Status: DC | PRN

## 2021-06-24 MED ORDER — ONDANSETRON HCL 4 MG/2ML IJ SOLN
INTRAMUSCULAR | Status: AC
  Filled 2021-06-24: qty 2

## 2021-06-24 MED ORDER — DEXAMETHASONE SODIUM PHOSPHATE 10 MG/ML IJ SOLN
INTRAMUSCULAR | Status: AC
  Filled 2021-06-24: qty 1

## 2021-06-24 MED ORDER — KETOROLAC TROMETHAMINE 30 MG/ML IJ SOLN
INTRAMUSCULAR | Status: AC
  Filled 2021-06-24: qty 1

## 2021-06-24 MED ORDER — OXYCODONE HCL 5 MG PO TABS: 5.0000 mg | ORAL_TABLET | Freq: Once | ORAL | Status: DC | PRN

## 2021-06-24 MED ORDER — MIDAZOLAM HCL 2 MG/2ML IJ SOLN
INTRAMUSCULAR | Status: AC
  Filled 2021-06-24: qty 2

## 2021-06-24 MED ORDER — PROPOFOL 10 MG/ML IV BOLUS
INTRAVENOUS | Status: AC
  Filled 2021-06-24: qty 40

## 2021-06-24 SURGICAL SUPPLY — 24 items
BLADE SURG 11 STRL SS (BLADE) ×2 IMPLANT
CATH ROBINSON RED A/P 16FR (CATHETERS) ×2 IMPLANT
ELECT REM PT RETURN 9FT ADLT (ELECTROSURGICAL) ×2
ELECTRODE REM PT RTRN 9FT ADLT (ELECTROSURGICAL) IMPLANT
GLOVE SURG LTX SZ7 (GLOVE) ×4 IMPLANT
GOWN STRL REUS W/ TWL LRG LVL3 (GOWN DISPOSABLE) ×1 IMPLANT
GOWN STRL REUS W/ TWL XL LVL3 (GOWN DISPOSABLE) ×1 IMPLANT
GOWN STRL REUS W/TWL LRG LVL3 (GOWN DISPOSABLE) ×2
GOWN STRL REUS W/TWL XL LVL3 (GOWN DISPOSABLE) ×2
NEEDLE HYPO 22GX1.5 SAFETY (NEEDLE) ×2 IMPLANT
NS IRRIG 1000ML POUR BTL (IV SOLUTION) ×1 IMPLANT
NS IRRIG 500ML POUR BTL (IV SOLUTION) ×1 IMPLANT
PACK VAGINAL MINOR WOMEN LF (CUSTOM PROCEDURE TRAY) ×2 IMPLANT
PAD OB MATERNITY 4.3X12.25 (PERSONAL CARE ITEMS) ×2 IMPLANT
PENCIL SMOKE EVACUATOR (MISCELLANEOUS) ×1 IMPLANT
SUT MON AB 3-0 SH 27 (SUTURE) ×2
SUT MON AB 3-0 SH27 (SUTURE) ×2 IMPLANT
SUT VIC AB 2-0 CT1 27 (SUTURE) ×4
SUT VIC AB 2-0 CT1 TAPERPNT 27 (SUTURE) IMPLANT
SWAB COLLECTION DEVICE MRSA (MISCELLANEOUS) IMPLANT
SWAB CULTURE ESWAB REG 1ML (MISCELLANEOUS) IMPLANT
TOWEL OR 17X26 10 PK STRL BLUE (TOWEL DISPOSABLE) ×2 IMPLANT
TUBE CONNECTING 20X1/4 (TUBING) ×2 IMPLANT
YANKAUER SUCT BULB TIP NO VENT (SUCTIONS) ×2 IMPLANT

## 2021-06-24 NOTE — Interval H&P Note (Signed)
History and Physical Interval Note:  06/24/2021 7:08 AM  [name]  has presented today for surgery, with the diagnosis of Bartholin Cyst.  The various methods of treatment have been discussed with the patient and family. After consideration of risks, benefits and other options for treatment, the patient has consented to  Procedure(s): BARTHOLIN GLAND EXCISION (N/A) as a surgical intervention.  The patient's history has been reviewed, patient examined, no change in status, stable for surgery.  I have reviewed the patient's chart and labs.  Questions were answered to the patient's satisfaction.     Carrie Nielsen

## 2021-06-24 NOTE — Transfer of Care (Signed)
Immediate Anesthesia Transfer of Care Note  Patient: [facility]  Procedure(s) Performed: Procedure(s) (LRB): BARTHOLIN GLAND EXCISION (N/A)  Patient Location: PACU  Anesthesia Type: General  Level of Consciousness: awake, sedated, patient cooperative and responds to stimulation  Airway & Oxygen Therapy: Patient Spontanous Breathing and Patient connected to Echelon 02 and soft FM   Post-op Assessment: Report given to PACU RN, Post -op Vital signs reviewed and stable and Patient moving all extremities  Post vital signs: Reviewed and stable  Complications: No apparent anesthesia complications

## 2021-06-24 NOTE — Anesthesia Postprocedure Evaluation (Signed)
Anesthesia Post Note  Patient: Katurah Karapetian  Procedure(s) Performed: BARTHOLIN GLAND EXCISION (Perineum)     Patient location during evaluation: PACU Anesthesia Type: General Level of consciousness: awake and alert Pain management: pain level controlled Vital Signs Assessment: post-procedure vital signs reviewed and stable Respiratory status: spontaneous breathing, nonlabored ventilation, respiratory function stable and patient connected to nasal cannula oxygen Cardiovascular status: blood pressure returned to baseline and stable Postop Assessment: no apparent nausea or vomiting Anesthetic complications: no   No notable events documented.  Last Vitals:  Vitals:   06/24/21 0900 06/24/21 0924  BP: (!) 119/93 125/86  Pulse: (!) 51 62  Resp: 10 17  Temp:  36.4 C  SpO2: 97% 100%    Last Pain:  Vitals:   06/24/21 0924  TempSrc:   PainSc: 0-No pain                 Shelton Silvas

## 2021-06-24 NOTE — Op Note (Signed)
06/24/2021  8:13 AM  PATIENT:  Carrie Nielsen  30 y.o. female  PRE-OPERATIVE DIAGNOSIS:  Bartholin Cyst  POST-OPERATIVE DIAGNOSIS:  Bartholin Cyst  PROCEDURE:  Procedure(s): BARTHOLIN GLAND EXCISION (N/A)  SURGEON:  Surgeon(s) and Role:    * Hermina Staggers, MD - Primary   ANESTHESIA:   local and general  EBL:  20 mL  IV Fluids: As recorded UOP: 5 cc  LOCAL MEDICATIONS USED:  MARCAINE , 20 cc  SPECIMEN:  Bartholin cyst and gland  DISPOSITION OF SPECIMEN:  PATHOLOGY  COUNTS:  YES  Procedure: Pt walked back to OR 5. She underwent GETA without problems. She was prepped and draped in the usual sterile fashion. Time was performed. A large left Bartholin cyst was noted. A small incision was made over the cyst on the inside of the vaginal. The cyst then was sharply and bluntly dissected from the underlying tissue to the gland base. At the base 2 clamps were placed. The cyst and gland were removed. The area was then closed in a layered fashion, closing dead space, with 2/0 Vicryl. The incisional edge was closed with 4/0 Monocryl. 20 cc of local were injected.  All counts x 2 were correct.  Pt was extubated and taken to PACU in stable condition    PLAN OF CARE: Discharge to home after PACU     Pt will follow up in the office in 2 weeks. Prescription for Motrin and Percocet were sent to pt's pharmacy Family was up dated.

## 2021-06-24 NOTE — Anesthesia Procedure Notes (Signed)
Procedure Name: LMA Insertion Date/Time: 06/24/2021 7:31 AM Performed by: Jessica Priest, CRNA Pre-anesthesia Checklist: Patient identified, Emergency Drugs available, Suction available, Patient being monitored and Timeout performed Patient Re-evaluated:Patient Re-evaluated prior to induction Oxygen Delivery Method: Circle system utilized Preoxygenation: Pre-oxygenation with 100% oxygen Induction Type: IV induction Ventilation: Mask ventilation without difficulty LMA: LMA inserted LMA Size: 4.0 Number of attempts: 1 Airway Equipment and Method: Bite block Placement Confirmation: positive ETCO2, breath sounds checked- equal and bilateral and CO2 detector Tube secured with: Tape Dental Injury: Teeth and Oropharynx as per pre-operative assessment

## 2021-06-24 NOTE — Discharge Instructions (Signed)
  Post Anesthesia Home Care Instructions  Activity: Get plenty of rest for the remainder of the day. A responsible individual must stay with you for 24 hours following the procedure.  For the next 24 hours, DO NOT: -Drive a car -Advertising copywriter -Drink alcoholic beverages -Take any medication unless instructed by your physician -Make any legal decisions or sign important papers.  Meals: Start with liquid foods such as gelatin or soup. Progress to regular foods as tolerated. Avoid greasy, spicy, heavy foods. If nausea and/or vomiting occur, drink only clear liquids until the nausea and/or vomiting subsides. Call your physician if vomiting continues.  Special Instructions/Symptoms: Your throat may feel dry or sore from the anesthesia or the breathing tube placed in your throat during surgery. If this causes discomfort, gargle with warm salt water. The discomfort should disappear within 24 hours.  If you had a scopolamine patch placed behind your ear for the management of post- operative nausea and/or vomiting:  1. The medication in the patch is effective for 72 hours, after which it should be removed.  Wrap patch in a tissue and discard in the trash. Wash hands thoroughly with soap and water. 2. You may remove the patch earlier than 72 hours if you experience unpleasant side effects which may include dry mouth, dizziness or visual disturbances. 3. Avoid touching the patch. Wash your hands with soap and water after contact with the patch.     No ibuprofen, Advil, Aleve, Motrin, ketorolac, meloxicam, or naproxen until after 2 pm today if needed.  Call your surgeon if you experience:   1.  Fever over 101.0. 2.  Inability to urinate within 6 hours of discharge. 3.  Nausea and/or vomiting. 4.  Extreme swelling or bruising at the surgical site. 5.  Continued bleeding from the incision or the incision appears to be opening up. 6.  Increased pain, redness or drainage from the incision. 7.   Problems related to your pain medication. 8.  Any problems and/or concerns

## 2021-06-24 NOTE — Anesthesia Preprocedure Evaluation (Addendum)
Anesthesia Evaluation  Patient identified by MRN, date of birth, ID band Patient awake    Reviewed: Allergy & Precautions, NPO status , Patient's Chart, lab work & pertinent test results  Airway Mallampati: II  TM Distance: >3 FB Neck ROM: Full    Dental  (+) Teeth Intact, Dental Advisory Given   Pulmonary neg pulmonary ROS,    breath sounds clear to auscultation       Cardiovascular negative cardio ROS   Rhythm:Regular Rate:Normal     Neuro/Psych negative neurological ROS  negative psych ROS   GI/Hepatic Neg liver ROS, GERD  ,  Endo/Other  negative endocrine ROS  Renal/GU negative Renal ROS     Musculoskeletal negative musculoskeletal ROS (+)   Abdominal Normal abdominal exam  (+)   Peds  Hematology negative hematology ROS (+)   Anesthesia Other Findings   Reproductive/Obstetrics                            Anesthesia Physical Anesthesia Plan  ASA: 2  Anesthesia Plan: General   Post-op Pain Management:    Induction: Intravenous  PONV Risk Score and Plan: 4 or greater and Ondansetron, Dexamethasone, Midazolam and Scopolamine patch - Pre-op  Airway Management Planned: LMA  Additional Equipment: None  Intra-op Plan:   Post-operative Plan: Extubation in OR  Informed Consent: I have reviewed the patients History and Physical, chart, labs and discussed the procedure including the risks, benefits and alternatives for the proposed anesthesia with the patient or authorized representative who has indicated his/her understanding and acceptance.     Dental advisory given  Plan Discussed with: CRNA  Anesthesia Plan Comments:        Anesthesia Quick Evaluation

## 2021-06-25 ENCOUNTER — Encounter (HOSPITAL_BASED_OUTPATIENT_CLINIC_OR_DEPARTMENT_OTHER): Payer: Self-pay | Admitting: Obstetrics and Gynecology

## 2021-06-25 LAB — SURGICAL PATHOLOGY

## 2021-07-10 ENCOUNTER — Encounter: Payer: Medicaid Other | Admitting: Obstetrics and Gynecology

## 2021-09-02 IMAGING — US US OB COMP LESS 14 WK
1 series · 15 of 28 positions shown · non-contrast
Comparison: None.

CLINICAL DATA: Pelvic pain

EXAM:
OBSTETRIC <14 WK ULTRASOUND
TECHNIQUE: Transabdominal ultrasound was performed for evaluation of the
gestation as well as the maternal uterus and adnexal regions.

[Series 1: us ob comp less 14 wk · 42 acquisitions, 15 frames shown]
[im 1/42]
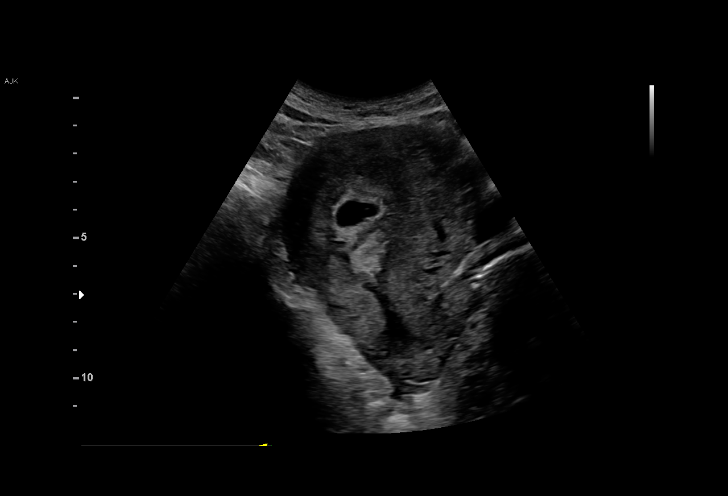
[im 4/42]
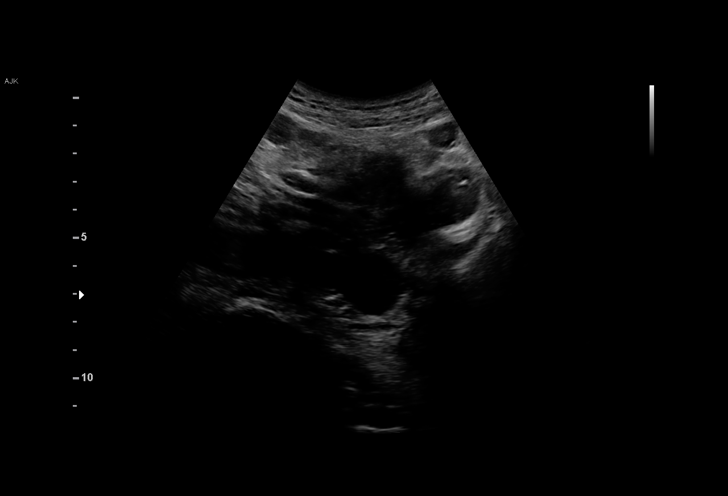
[im 7/42]
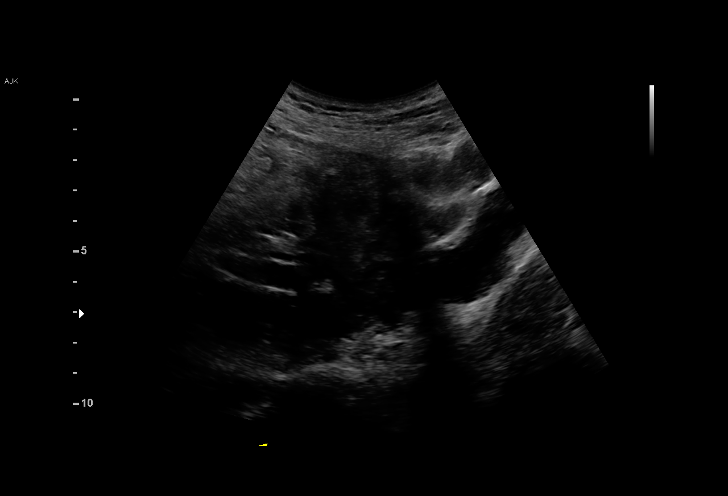
[im 10/42]
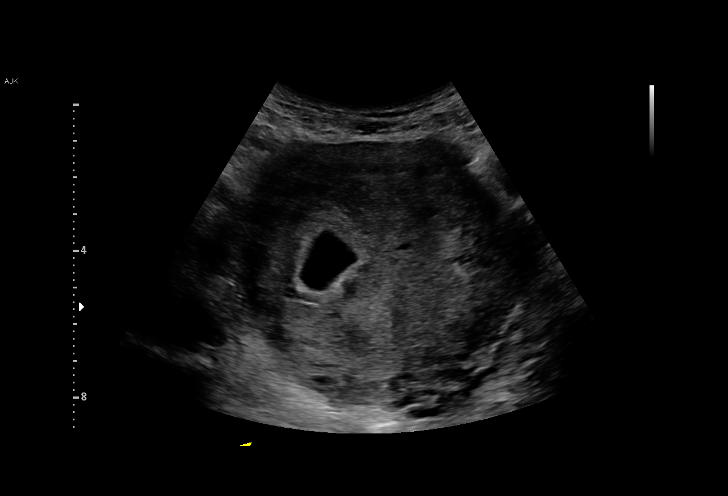
[im 13/42]
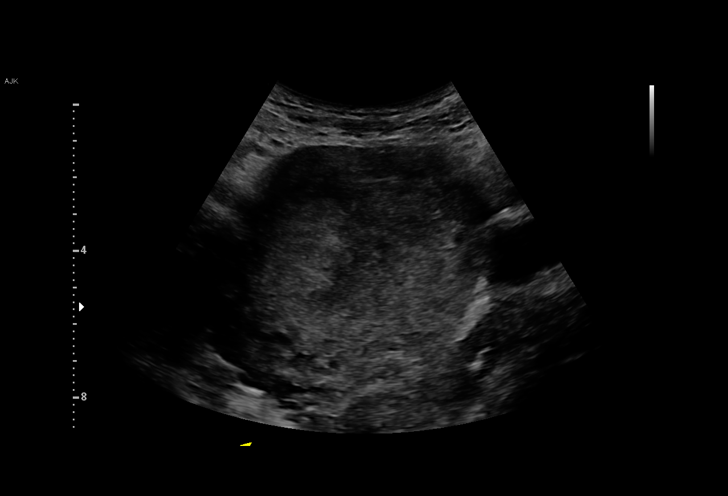
[im 16/42]
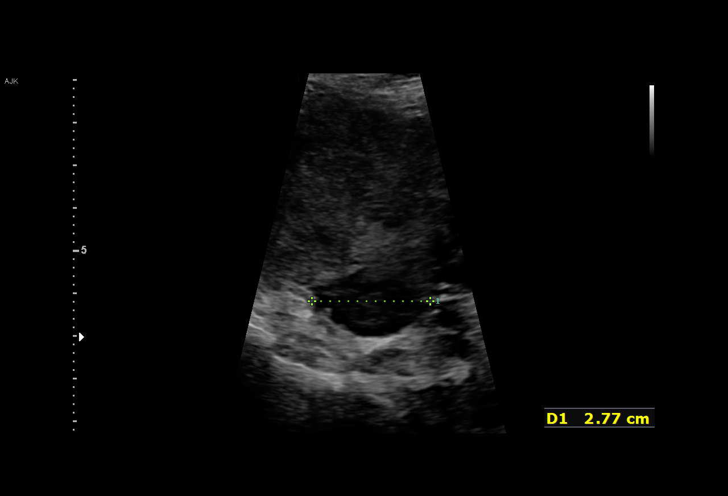
[im 19/42]
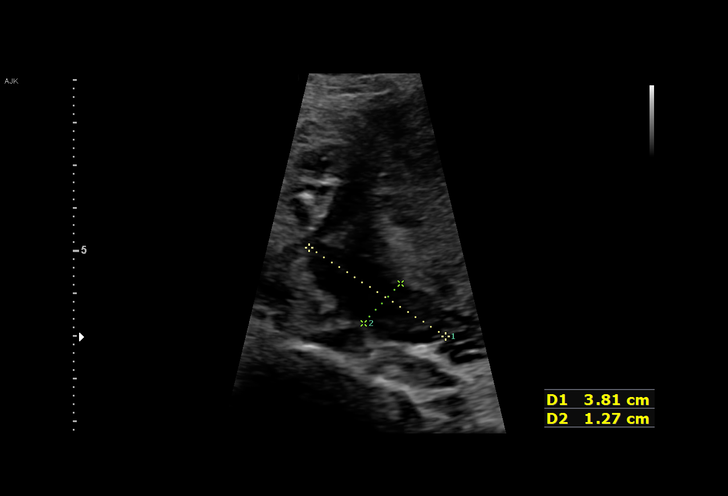
[im 22/42]
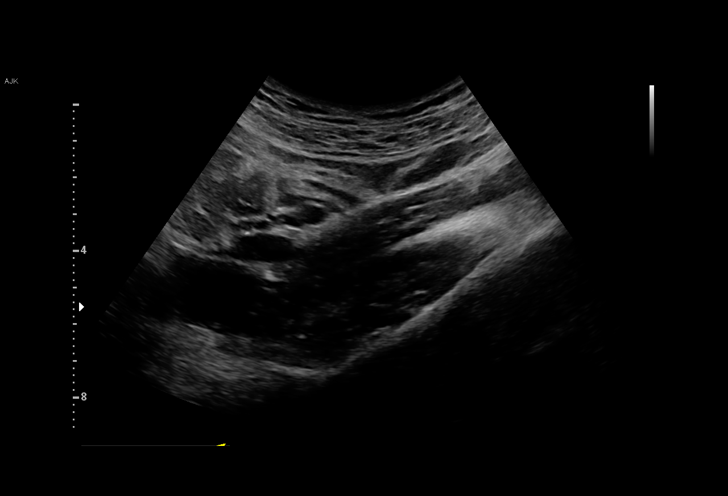
[im 23/42]
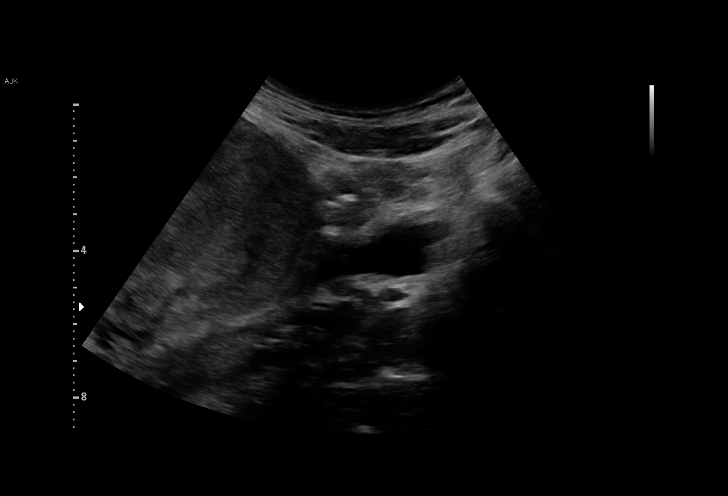
[im 26/42]
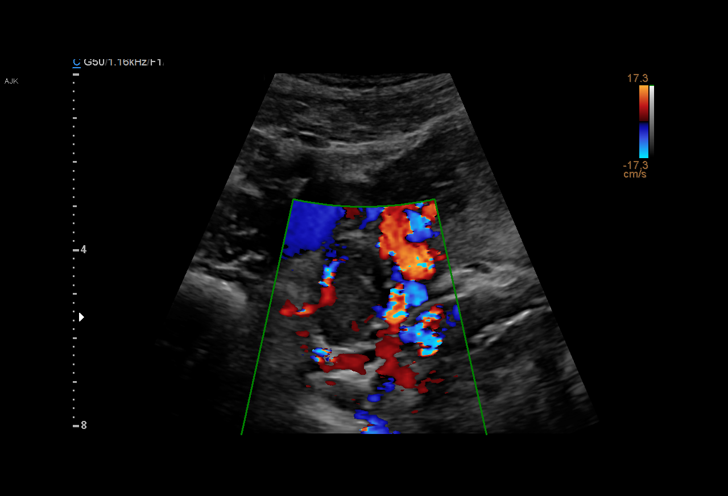
[im 29/42]
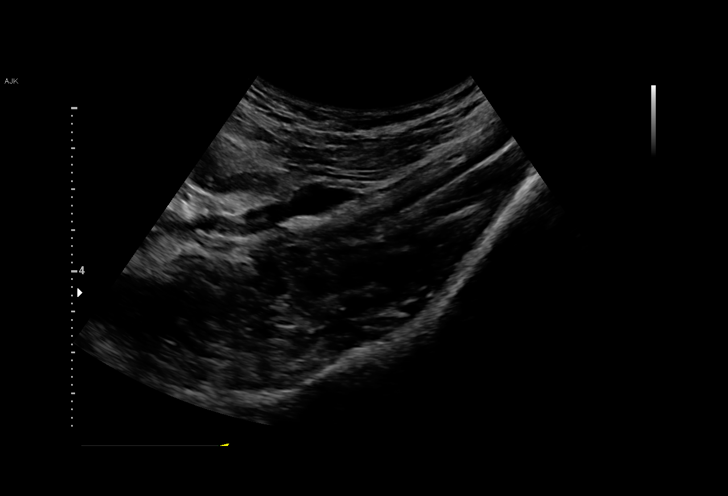
[im 32/42]
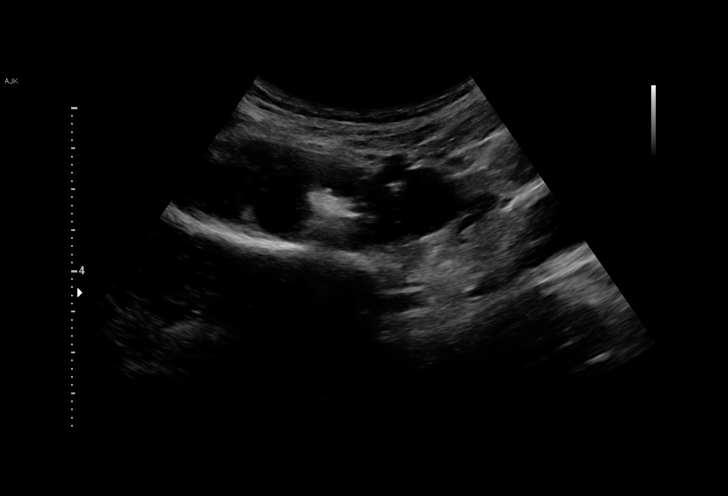
[im 35/42]
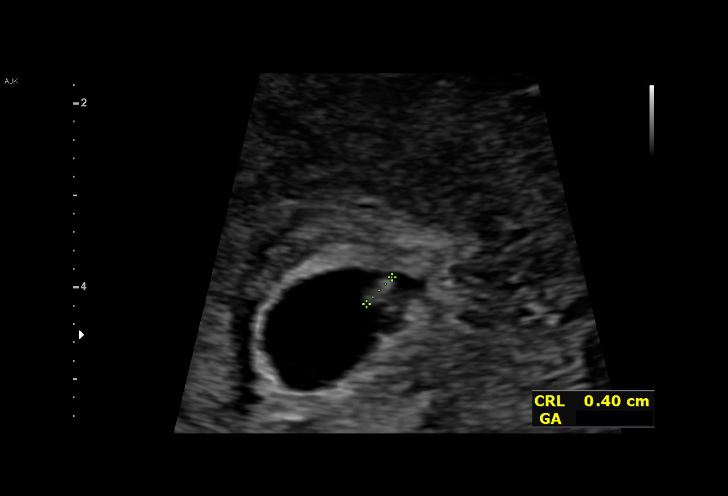
[im 38/42]
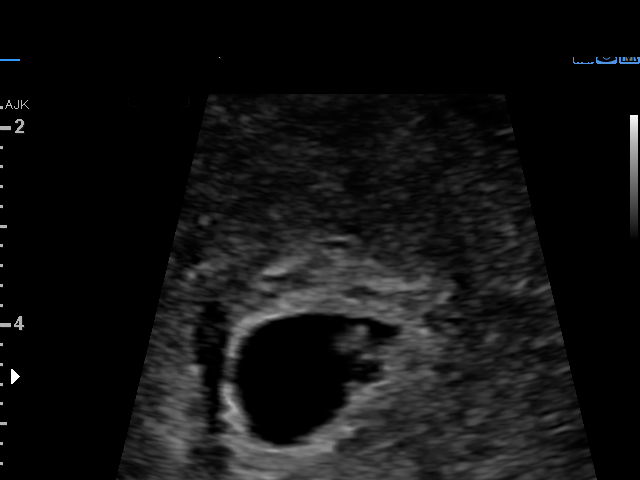
[im 42/42]
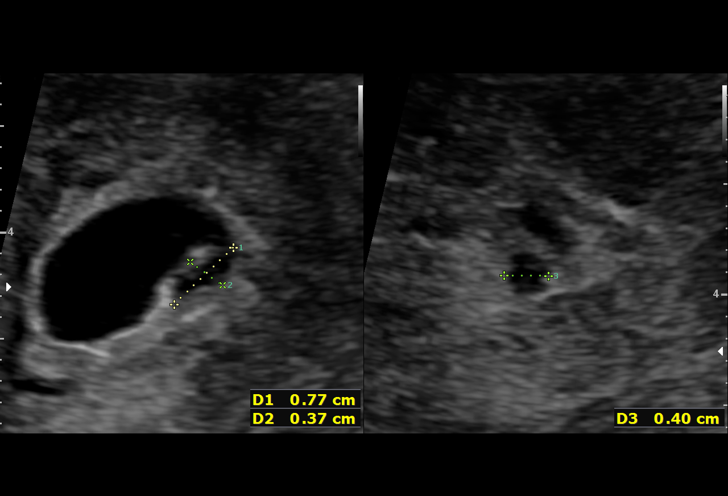

[15 of 28 positions shown; findings below may reference images not displayed]

FINDINGS: Intrauterine gestational sac: Single

Yolk sac:  Visualized

Embryo:  Visualized

Cardiac Activity: Visualized

Heart Rate: 111 bpm

CRL: 4.1 mm   6 w 1 d                  US EDC: 11/11/2021

Subchorionic hemorrhage: Small subchorionic hemorrhage along the
posterior sac.

Maternal uterus/adnexae: Ovaries are within normal limits. Right
ovary measures 2.5 x 2.4 by 1.8 cm. Left ovary measures 3.8 x 1.3 x
2.8 cm. No significant free fluid
IMPRESSION: 1. Single viable intrauterine pregnancy as above.
2. Small subchorionic hemorrhage

## 2021-11-06 ENCOUNTER — Ambulatory Visit: Payer: Medicaid Other

## 2021-11-09 NOTE — L&D Delivery Note (Cosign Needed Addendum)
LABOR COURSE Carrie Nielsen is a 31 y.o. female [phone] with IUP at [redacted]w[redacted]d by LMP who initially presented to [facility]U for contractions with leakage of fluid. Initial exam 5/80/-2. Epidural placed. Progressed well with expectant management.   Delivery Note Called to room and patient was complete and pushing. Head delivered direct OP. No nuchal cord present. Shoulder and body delivered in usual fashion. At  23:27 a viable and healthy female was delivered via Vaginal, Spontaneous (Presentation: direct OP).  Infant with spontaneous cry, placed on mother's abdomen, dried and stimulated. Cord clamped x 2 after 1-minute delay, and cut by grandmother of the baby. Cord blood drawn. Placenta delivered spontaneously with gentle cord traction. Appears intact. Fundus firm with massage and Pitocin. Labia, perineum, vagina, and cervix inspected with mild perineal abrasions.   PPIUD placed; see separate procedure note.    APGAR: 8,9 ; weight not measured as of yet Cord: 3VC with no complications   Anesthesia:  Epidural Episiotomy: None Lacerations: Hemostatic perineal abrasions. Est. Blood Loss (mL): 100  Mom to postpartum.  Baby to Couplet care / Skin to Skin.  Moody Bruins, MD Resident Physician 07/05/2022 11:43 PM    GME ATTESTATION:  I saw and evaluated the patient. I agree with the findings and the plan of care as documented in the resident's note. I have made changes to documentation as necessary.  Lavonda Jumbo, DO OB Fellow, Faculty [facility], Center for [facility] 07/05/2022, 11:57 PM

## 2021-12-03 ENCOUNTER — Other Ambulatory Visit: Payer: Self-pay

## 2021-12-03 ENCOUNTER — Other Ambulatory Visit (HOSPITAL_COMMUNITY)
Admission: RE | Admit: 2021-12-03 | Discharge: 2021-12-03 | Disposition: A | Discharge status: Home / Self Care | Payer: Medicaid Other | Source: Ambulatory Visit | Attending: Obstetrics and Gynecology | Admitting: Obstetrics and Gynecology

## 2021-12-03 ENCOUNTER — Ambulatory Visit (INDEPENDENT_AMBULATORY_CARE_PROVIDER_SITE_OTHER): Payer: Medicaid Other

## 2021-12-03 DIAGNOSIS — N898 Other specified noninflammatory disorders of vagina: Secondary | ICD-10-CM | POA: Insufficient documentation

## 2021-12-03 DIAGNOSIS — N912 Amenorrhea, unspecified: Secondary | ICD-10-CM

## 2021-12-03 LAB — POCT URINE PREGNANCY: Preg Test, Ur: POSITIVE — AB

## 2021-12-03 NOTE — Progress Notes (Signed)
Patient was assessed and managed by nursing staff during this encounter. I have reviewed the chart and agree with the documentation and plan. I have also made any necessary editorial changes. ° °Larayah Clute A Maziah Smola, MD °12/03/2021 4:30 PM   °

## 2021-12-03 NOTE — Progress Notes (Signed)
Ms. Carrie Nielsen presents today for UPT. She has no unusual complaints. LMP: 10/06/21 EDD 07/13/22. Patient is [redacted]w[redacted]d.    OBJECTIVE: Appears well, in no apparent distress.  OB History     Gravida  5   Para  3   Term  3   Preterm      AB  2   Living  3      SAB  1   IAB  1   Ectopic      Multiple  0   Live Births  3          Home UPT Result: Has not taken a HPT. In-Office UPT result: Positive I have reviewed the patient's medical, obstetrical, social, and family histories, and medications.   ASSESSMENT: Positive pregnancy test  PLAN Prenatal care to be completed at: Patient does not plan on going through with the pregnancy. No future appts to schedule at this time. Patient advised to schedule appt at a later date for birth control consult.     SUBJECTIVE:  31 y.o. female complains of white and creamy vaginal discharge for 2 week(s). Patient denies having any odor, itching, or irritation. Denies abnormal vaginal bleeding or significant pelvic pain or fever. No UTI symptoms. Denies history of known exposure to STD.  Patient's last menstrual period was 10/06/2021.  OBJECTIVE:  She appears well, afebrile. Urine dipstick: not done.  ASSESSMENT:  Vaginal Discharge  Vaginal Odor   PLAN:  GC, chlamydia, trichomonas, BVAG, CVAG probe sent to lab. Treatment: To be determined once lab results are received ROV prn if symptoms persist or worsen.

## 2021-12-05 LAB — CERVICOVAGINAL ANCILLARY ONLY
Bacterial Vaginitis (gardnerella): POSITIVE — AB
Candida Glabrata: NEGATIVE
Candida Vaginitis: NEGATIVE
Chlamydia: NEGATIVE
Comment: NEGATIVE
Comment: NEGATIVE
Comment: NEGATIVE
Comment: NEGATIVE
Comment: NEGATIVE
Comment: NORMAL
Neisseria Gonorrhea: NEGATIVE
Trichomonas: NEGATIVE

## 2021-12-09 ENCOUNTER — Other Ambulatory Visit: Payer: Self-pay

## 2021-12-09 DIAGNOSIS — B9689 Other specified bacterial agents as the cause of diseases classified elsewhere: Secondary | ICD-10-CM

## 2021-12-09 MED ORDER — METRONIDAZOLE 500 MG PO TABS
500.0000 mg | ORAL_TABLET | Freq: Two times a day (BID) | ORAL | 0 refills | Status: DC
Start: 2021-12-09 — End: 2021-12-26

## 2021-12-12 ENCOUNTER — Other Ambulatory Visit: Payer: Self-pay | Admitting: Obstetrics and Gynecology

## 2021-12-26 ENCOUNTER — Ambulatory Visit (HOSPITAL_COMMUNITY)
Admission: EM | Admit: 2021-12-26 | Discharge: 2021-12-26 | Disposition: A | Discharge status: Home / Self Care | Payer: Medicaid Other | Attending: Urgent Care | Admitting: Urgent Care

## 2021-12-26 ENCOUNTER — Encounter (HOSPITAL_COMMUNITY): Payer: Self-pay

## 2021-12-26 DIAGNOSIS — G629 Polyneuropathy, unspecified: Secondary | ICD-10-CM | POA: Insufficient documentation

## 2021-12-26 DIAGNOSIS — O210 Mild hyperemesis gravidarum: Secondary | ICD-10-CM | POA: Insufficient documentation

## 2021-12-26 LAB — COMPREHENSIVE METABOLIC PANEL
ALT: 14 U/L (ref 0–44)
AST: 18 U/L (ref 15–41)
Albumin: 3.1 g/dL — ABNORMAL LOW (ref 3.5–5.0)
Alkaline Phosphatase: 54 U/L (ref 38–126)
Anion gap: 7 (ref 5–15)
BUN: 9 mg/dL (ref 6–20)
CO2: 22 mmol/L (ref 22–32)
Calcium: 8.7 mg/dL — ABNORMAL LOW (ref 8.9–10.3)
Chloride: 105 mmol/L (ref 98–111)
Creatinine, Ser: 0.53 mg/dL (ref 0.44–1.00)
GFR, Estimated: >60 mL/min (ref >60)
Glucose, Bld: 107 mg/dL — ABNORMAL HIGH (ref 70–99)
Potassium: 3.8 mmol/L (ref 3.5–5.1)
Sodium: 134 mmol/L — ABNORMAL LOW (ref 135–145)
Total Bilirubin: 0.5 mg/dL (ref 0.3–1.2)
Total Protein: 6.6 g/dL (ref 6.5–8.1)

## 2021-12-26 LAB — CBC WITH DIFFERENTIAL/PLATELET
Abs Immature Granulocytes: 0.02 10*3/uL (ref 0.00–0.07)
Basophils Absolute: 0 10*3/uL (ref 0.0–0.1)
Basophils Relative: 0 %
Eosinophils Absolute: 0.1 10*3/uL (ref 0.0–0.5)
Eosinophils Relative: 1 %
HCT: 36.6 % (ref 36.0–46.0)
Hemoglobin: 12.7 g/dL (ref 12.0–15.0)
Immature Granulocytes: 0 %
Lymphocytes Relative: 11 %
Lymphs Abs: 0.9 10*3/uL (ref 0.7–4.0)
MCH: 30.8 pg (ref 26.0–34.0)
MCHC: 34.7 g/dL (ref 30.0–36.0)
MCV: 88.6 fL (ref 80.0–100.0)
Monocytes Absolute: 0.5 10*3/uL (ref 0.1–1.0)
Monocytes Relative: 6 %
Neutro Abs: 6.4 10*3/uL (ref 1.7–7.7)
Neutrophils Relative %: 82 %
Platelets: 300 10*3/uL (ref 150–400)
RBC: 4.13 MIL/uL (ref 3.87–5.11)
RDW: 13.4 % (ref 11.5–15.5)
WBC: 7.8 10*3/uL (ref 4.0–10.5)
nRBC: 0 % (ref 0.0–0.2)

## 2021-12-26 LAB — VITAMIN B12: Vitamin B-12: 385 pg/mL (ref 180–914)

## 2021-12-26 LAB — TSH: TSH: 0.142 u[IU]/mL — ABNORMAL LOW (ref 0.350–4.500)

## 2021-12-26 LAB — FOLATE: Folate: 14 ng/mL (ref >5.9)

## 2021-12-26 MED ORDER — DOXYLAMINE-PYRIDOXINE 10-10 MG PO TBEC: DELAYED_RELEASE_TABLET | ORAL | 0 refills | Status: DC

## 2021-12-26 MED ORDER — PRENATAL 28-0.8 MG PO TABS: 1.0000 | ORAL_TABLET | ORAL | 0 refills | Status: AC

## 2021-12-26 NOTE — Discharge Instructions (Addendum)
You had labs drawn today to assess the cause of your tingling in your feet. You must start a prenatal vitamin, this is most important during the first three months as the neural tube develops in the baby. This has been called in for you. You can use Diclegis for your intermittent nausea, on an as needed basis. Please follow up with your OB for any persistent symptoms and for routine prenatal check ups

## 2021-12-26 NOTE — ED Triage Notes (Signed)
Pt presents with tingling in her feet, vomiting, and generalized body aches X 1 week.

## 2021-12-26 NOTE — ED Provider Notes (Signed)
[facility]    CSN: [id number] Arrival date & time: 12/26/21  1034      History   Chief Complaint Chief Complaint  Patient presents with   Tingling   Emesis   Generalized Body Aches    HPI Carrie Nielsen is a 31 y.o. female.   Pleasant 31 year old female who states she believes she is roughly 3 months pregnant, presents with a 1 week history of feet tingling and intermittent nausea.  States she vomited for the first time last evening.  She does admit that her younger children both have viral-like illnesses, but she denies any abdominal discomfort, fever, or URI symptoms.  She has been pregnant 3 times in the past, and states she has had tingling with all of her pregnancies, but usually not until 9 months.  She is not currently taking a prenatal.  She denies alcohol use.  She is not a vegetarian.  She has not yet seen her OB for this.  She denies any abdominal pain, change to bowel movements, reflux, decreased appetite.  She states her symptoms are usually more like morning sickness.   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   Esophageal reflux 02/08/2013    Past Surgical History:  Procedure Laterality Date   BARTHOLIN GLAND CYST EXCISION N/A 06/24/2021   Procedure: BARTHOLIN GLAND EXCISION;  Surgeon: Chancy Milroy, MD;  Location: [facility];  Service: Gynecology;  Laterality: N/A;   NO PAST SURGERIES     WISDOM TOOTH EXTRACTION     yrs ago    OB History     Gravida  6   Para  3   Term  3   Preterm      AB  2   Living  3       SAB  1   IAB  1   Ectopic      Multiple  0   Live Births  3            Home Medications    Prior to Admission medications   Medication Sig Start Date End Date Taking? Authorizing Provider  Doxylamine-Pyridoxine (DICLEGIS) 10-10 MG TBEC Take 2 tabs PO Q HS. If sx persist after 2 days, can increase to 1 tab PO Q am and 2 tabs PO QHS. Do not exceed 4 tabs in 24 hours. Take on empty stomach 12/26/21  Yes Joeph Szatkowski L, PA  Prenatal 28-0.8 MG TABS Take 1 tablet by mouth 1 day or 1 dose for 1 dose. 12/26/21 12/27/21 Yes Phil Corti L, PA  valACYclovir (VALTREX) 500 MG tablet Take 1 tablet by mouth twice daily 12/15/21   Constant, Peggy, MD    Family History Family History  Problem Relation Age of Onset   Cancer Maternal Grandfather        brain   Alcohol abuse Neg Hx    Arthritis Neg Hx    Asthma Neg Hx    Birth defects Neg Hx    COPD Neg Hx    Depression Neg Hx    Diabetes  Neg Hx    Early death Neg Hx    Drug abuse Neg Hx    Hearing loss Neg Hx    Heart disease Neg Hx    Hyperlipidemia Neg Hx    Hypertension Neg Hx    Kidney disease Neg Hx    Learning disabilities Neg Hx    Mental illness Neg Hx    Mental retardation Neg Hx    Miscarriages / Stillbirths Neg Hx    Stroke Neg Hx    Vision loss Neg Hx     Social History Social History   Tobacco Use   Smoking status: Never   Smokeless tobacco: Never  Vaping Use   Vaping Use: Never used  Substance Use Topics   Alcohol use: No    Alcohol/week: 0.0 standard drinks   Drug use: No     Allergies   Patient has no known allergies.   Review of Systems Review of Systems  Gastrointestinal:  Positive for nausea. Negative for abdominal pain, constipation and diarrhea.  Neurological:  Positive for numbness (tingling to feet).  All other systems reviewed and are negative.   Physical Exam Triage Vital Signs ED Triage Vitals  Enc Vitals Group     BP 12/26/21 1106 (!) 117/59     Pulse Rate 12/26/21 1106 88      Resp 12/26/21 1106 18     Temp 12/26/21 1106 (!) 97.2 F (36.2 C)     Temp Source 12/26/21 1106 Oral     SpO2 12/26/21 1106 95 %     Weight --      Height --      Head Circumference --      Peak Flow --      Pain Score 12/26/21 1107 3     Pain Loc --      Pain Edu? --      Excl. in [place]? --    No data found.  Updated Vital Signs BP (!) 117/59 (BP Location: Left Arm)    Pulse 88    Temp (!) 97.2 F (36.2 C) (Oral)    Resp 18    LMP 10/06/2021 (Approximate)    SpO2 95%   Visual Acuity Right Eye Distance:   Left Eye Distance:   Bilateral Distance:    Right Eye Near:   Left Eye Near:    Bilateral Near:     Physical Exam Vitals and nursing note reviewed.  Constitutional:      General: She is not in acute distress.    Appearance: Normal appearance. She is well-developed and normal weight. She is not ill-appearing, toxic-appearing or diaphoretic.  HENT:     Head: Normocephalic and atraumatic.     Right Ear: External ear normal.     Left Ear: External ear normal.     Nose: Nose normal. No congestion or rhinorrhea.     Mouth/Throat:     Mouth: Mucous membranes are moist.     Pharynx: Oropharynx is clear. No oropharyngeal exudate or posterior oropharyngeal erythema.  Eyes:     Extraocular Movements: Extraocular movements intact.     Conjunctiva/sclera: Conjunctivae normal.     Pupils: Pupils are equal, round, and reactive to light.  Cardiovascular:     Rate and Rhythm: Normal rate and regular rhythm.     Heart sounds: No murmur heard. Pulmonary:     Effort: Pulmonary effort is normal. No respiratory distress.     Breath sounds: Normal breath sounds. No wheezing.  Chest:  Chest wall: No tenderness.  Abdominal:     General: Abdomen is flat.     Palpations: Abdomen is soft.     Tenderness: There is no abdominal tenderness.  Musculoskeletal:        General: No swelling, tenderness, deformity or signs of injury. Normal range of motion.     Cervical back: Normal range of  motion and neck supple. No rigidity or tenderness.     Right lower leg: No edema.     Left lower leg: No edema.  Lymphadenopathy:     Cervical: No cervical adenopathy.  Skin:    General: Skin is warm and dry.     Capillary Refill: Capillary refill takes less than 2 seconds.     Coloration: Skin is not jaundiced.     Findings: No erythema or rash.  Neurological:     General: No focal deficit present.     Mental Status: She is alert. Mental status is at baseline.     Cranial Nerves: No cranial nerve deficit.     Sensory: No sensory deficit (unable to appreciate any deficit, subjective only).     Motor: No weakness.     Coordination: Coordination normal.     Gait: Gait normal.     Deep Tendon Reflexes: Reflexes normal.  Psychiatric:        Mood and Affect: Mood normal.        Behavior: Behavior normal.     UC Treatments / Results  Labs (all labs ordered are listed, but only abnormal results are displayed) Labs Reviewed  COMPREHENSIVE METABOLIC PANEL  CBC WITH DIFFERENTIAL/PLATELET  TSH  VITAMIN B12  FOLATE    EKG   Radiology No results found.  Procedures Procedures (including critical care time)  Medications Ordered in UC Medications - No data to display  Initial Impression / Assessment and Plan / UC Course  I have reviewed the triage vital signs and the nursing notes.  Pertinent labs & imaging results that were available during my care of the patient were reviewed by me and considered in my medical decision making (see chart for details).     Neuropathy - numbness/tingling complaint per pt. No abnormal findings on physical exam. Will do standard labs, but given pregnancy will need to follow up with OB. Pt is not currently on a prenatal, so recommended starting one ASAP Morning sickness - will do diclegis as needed. F/U with OB for further recommendations. No abdominal findings and VSS.  Final Clinical Impressions(s) / UC Diagnoses   Final diagnoses:   Neuropathy  Morning sickness     Discharge Instructions      You had labs drawn today to assess the cause of your tingling in your feet. You must start a prenatal vitamin, this is most important during the first three months as the neural tube develops in the baby. This has been called in for you. You can use Diclegis for your intermittent nausea, on an as needed basis. Please follow up with your PCP for any persistent symptoms.    ED Prescriptions     Medication Sig Dispense Auth. Provider   Prenatal 28-0.8 MG TABS Take 1 tablet by mouth 1 day or 1 dose for 1 dose. 90 tablet Jojo Geving L, PA   Doxylamine-Pyridoxine (DICLEGIS) 10-10 MG TBEC Take 2 tabs PO Q HS. If sx persist after 2 days, can increase to 1 tab PO Q am and 2 tabs PO QHS. Do not exceed 4 tabs in 24 hours.  Take on empty stomach 60 tablet Abigael Mogle L, PA      PDMP not reviewed this encounter.   Chaney Malling, [place] 12/26/21 1140

## 2022-02-20 ENCOUNTER — Telehealth: Payer: Self-pay | Admitting: Emergency Medicine

## 2022-02-20 NOTE — Telephone Encounter (Signed)
Attempted call x2 for New OB intake telephone visit. Unable to leave vm. ?

## 2022-03-11 DIAGNOSIS — Z3480 Encounter for supervision of other normal pregnancy, unspecified trimester: Secondary | ICD-10-CM | POA: Insufficient documentation

## 2022-03-12 ENCOUNTER — Other Ambulatory Visit: Payer: Self-pay

## 2022-03-12 ENCOUNTER — Ambulatory Visit (INDEPENDENT_AMBULATORY_CARE_PROVIDER_SITE_OTHER): Payer: Medicaid Other | Admitting: Obstetrics and Gynecology

## 2022-03-12 ENCOUNTER — Other Ambulatory Visit (HOSPITAL_COMMUNITY)
Admission: RE | Admit: 2022-03-12 | Discharge: 2022-03-12 | Disposition: A | Discharge status: Home / Self Care | Payer: Medicaid Other | Source: Ambulatory Visit

## 2022-03-12 ENCOUNTER — Encounter: Payer: Self-pay | Admitting: Obstetrics and Gynecology

## 2022-03-12 DIAGNOSIS — Z3480 Encounter for supervision of other normal pregnancy, unspecified trimester: Secondary | ICD-10-CM

## 2022-03-12 DIAGNOSIS — Z3A22 22 weeks gestation of pregnancy: Secondary | ICD-10-CM

## 2022-03-12 DIAGNOSIS — Z3482 Encounter for supervision of other normal pregnancy, second trimester: Secondary | ICD-10-CM

## 2022-03-12 MED ORDER — BLOOD PRESSURE KIT DEVI: 1.0000 | 0 refills | Status: DC

## 2022-03-12 NOTE — Progress Notes (Signed)
NOB Late to Care ?

## 2022-03-12 NOTE — Progress Notes (Signed)
? ?History:  ? Carrie Nielsen is a 31 y.o. [id number] at [redacted]w[redacted]d by LMP being seen today for her first obstetrical visit.  Her obstetrical history is significant for  Late to prenatal care , HSV.  Patient does not intend to breast feed. Pregnancy history fully reviewed. Planned pregnancy.; partner Casimiro Needle involved.  ? ?Patient reports no complaints. ? ?HISTORY: ?OB History  ?Gravida Para Term Preterm AB Living  ?6 3 3  0 2 3  ?SAB IAB Ectopic Multiple Live Births  ?1 1 0 0 3  ?  ?# Outcome Date GA Lbr Len/2nd Weight Sex Delivery Anes PTL Lv  ?6 Current           ?5 IAB 03/2021          ?4 Term 12/04/20 [redacted]w[redacted]d 06:29 / 00:06 6 lb 9.5 oz (2.991 kg) M Vag-Spont EPI  LIV  ?   Name: PROMISE, WELDIN  ?   Apgar1: 9  Apgar5: 9  ?3 Term 02/11/16 [redacted]w[redacted]d 07:15 / 00:21 8 lb 7.3 oz (3.835 kg) M Vag-Vacuum EPI  LIV  ?   Name: KENEDY, HAISLEY  ?   Apgar1: 8  Apgar5: 9  ?2 Term 05/31/13 [redacted]w[redacted]d 17:30 / 02:37 7 lb 1.2 oz (3.21 kg) M Vag-Spont EPI  LIV  ?   Name: LYNDSIE, WALLMAN  ?   Apgar1: 9  Apgar5: 9  ?1 SAB           ?  ?Last pap smear was done 2021 and was abnormal - ASCUS with HPV. Patient did not have colpo. ? ?Past Medical History:  ?Diagnosis Date  ? Bartholin cyst 06/19/2021  ? History of cold sores 06/19/2021  ? Pregnancy induced hypertension   ? Vaginal Pap smear, abnormal 11/08/2020  ? ?Past Surgical History:  ?Procedure Laterality Date  ? BARTHOLIN GLAND CYST EXCISION N/A 06/24/2021  ? Procedure: BARTHOLIN GLAND EXCISION;  Surgeon: 06/26/2021, MD;  Location: [facility];  Service: Gynecology;  Laterality: N/A;  ? WISDOM TOOTH EXTRACTION    ? yrs ago  ? ?Family History  ?Problem Relation Age of Onset  ? Cancer Maternal Grandfather   ?     brain  ? Alcohol abuse Neg Hx   ? Arthritis Neg Hx   ? Asthma Neg Hx   ? Birth defects Neg Hx   ? COPD Neg Hx   ? Depression Neg Hx   ? Diabetes Neg Hx   ? Early death Neg Hx   ? Drug abuse Neg Hx   ? Hearing loss Neg Hx   ? Heart disease Neg Hx   ? Hyperlipidemia  Neg Hx   ? Hypertension Neg Hx   ? Kidney disease Neg Hx   ? Learning disabilities Neg Hx   ? Mental illness Neg Hx   ? Mental retardation Neg Hx   ? Miscarriages / Stillbirths Neg Hx   ? Stroke Neg Hx   ? Vision loss Neg Hx   ? ?Social History  ? ?Tobacco Use  ? Smoking status: Never  ? Smokeless tobacco: Never  ?Vaping Use  ? Vaping Use: Never used  ?Substance Use Topics  ? Alcohol use: No  ?  Alcohol/week: 0.0 standard drinks  ? Drug use: No  ? ?No Known Allergies ?Current Outpatient Medications on File Prior to Visit  ?Medication Sig Dispense Refill  ? Doxylamine-Pyridoxine (DICLEGIS) 10-10 MG TBEC Take 2 tabs PO Q HS. If sx persist after 2 days, can increase to 1 tab PO  Q am and 2 tabs PO QHS. Do not exceed 4 tabs in 24 hours. Take on empty stomach (Patient not taking: Reported on 03/12/2022) 60 tablet 0  ? valACYclovir (VALTREX) 500 MG tablet Take 1 tablet by mouth twice daily (Patient not taking: Reported on 03/12/2022) 60 tablet 0  ? ?No current facility-administered medications on file prior to visit.  ? ? ?Review of Systems ?Pertinent items noted in HPI and remainder of comprehensive ROS otherwise negative. ? ?Indications for ASA therapy (per UpToDate) ?One of the following: ?Previous pregnancy with preeclampsia, especially early onset and with an adverse outcome No ?Multifetal gestation No ?Chronic hypertension No ?Type 1 or 2 diabetes mellitus No ?Chronic kidney disease No ?Autoimmune disease (antiphospholipid syndrome, systemic lupus erythematosus) No ?Two or more of the following: ?Nulliparity No ?Obesity (body mass index >30 kg/m2) No ?Family history of preeclampsia in mother or sister No ?Age ?35 years No ?Sociodemographic characteristics (African American race, low socioeconomic level) Yes ?Personal risk factors (eg, previous pregnancy with low birth weight or small for gestational age infant, previous adverse pregnancy outcome [eg, stillbirth], interval >10 years between pregnancies) No ? ? ?Physical  Exam:  ? ?Vitals:  ? 03/12/22 1043  ?BP: (!) 101/59  ?Pulse: 77  ?Weight: 175 lb (79.4 kg)  ? ?General: well-developed, well-nourished female in no acute distress  ?Breasts:  normal appearance, no masses or tenderness bilaterally, exam done in the presence of a chaperone.   ?Skin: normal coloration and turgor, no rashes  ?Neurologic: oriented, normal, negative, normal mood  ?Extremities: normal strength, tone, and muscle mass, ROM of all joints is normal  ?HEENT PERRLA, extraocular movement intact and sclera clear, anicteric  ?Neck supple and no masses  ?Cardiovascular: regular rate and rhythm  ?Respiratory:  no respiratory distress, normal breath sounds  ?Abdomen: soft, non-tender; bowel sounds normal; no masses,  no organomegaly  ?Pelvic: normal external genitalia, no lesions, normal vaginal mucosa, normal vaginal discharge, normal cervix, pap smear done. Exam done in the presence of a chaperone.   ?  ?Assessment:  ?  ?Pregnancy: [id number] ?Patient Active Problem List  ? Diagnosis Date Noted  ? Supervision of other normal pregnancy, antepartum 03/11/2022  ? ASCUS with positive high risk HPV cervical 11/20/2020  ? History of herpes genitalis 11/11/2020  ? Bartholin cyst 04/26/2020  ? 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 Esophageal reflux 02/08/2013  ? ?  ?Plan:  ?  ?1. Supervision of other normal pregnancy, antepartum ? ?- Genetic Screening ?- Culture, OB Urine ?- CBC/D/Plt+RPR+Rh+ABO+RubIgG... ?- HgB A1c ?- [place] MFM OB DETAIL +14 WK; Future ?- Enroll Patient in PreNatal Babyscripts ?- Cytology - PAP( [facility]) ?- Cervicovaginal ancillary only( [facility])  ? ? ?Initial labs drawn. ?Continue prenatal vitamins. ?Problem list reviewed and updated. ?Genetic Screening discussed, Panorama and Horizon: requested. ?Ultrasound discussed; fetal anatomic survey: ordered. ?Anticipatory guidance about prenatal visits given including labs, ultrasounds, and testing. ?Discussed usage of the Babyscripts app for  more information about pregnancy, and to track blood pressures. ?Also discussed usage of virtual visits as additional source of managing and completing prenatal visits.   ?Patient was encouraged to use MyChart to review results, send requests, and have questions addressed.   ?The nature of [facility]Faculty Practice with multiple MDs and Advanced Practice Providers was explained to patient; also emphasized that residents, students are part of our team. ?Routine obstetric precautions reviewed. Encouraged to seek out care at office or emergency room [facility]  MAU preferred) for urgent and/or emergent concerns. ?Return in about 4 weeks (around 04/09/2022), or for 2 hour GTT, come fasting.  ? ? ?Tami Blass, Harolyn RutherfordJennifer I, NP ?Faculty Practice ?[facility], [facility] ?  ?

## 2022-03-13 LAB — CBC/D/PLT+RPR+RH+ABO+RUBIGG...
Antibody Screen: NEGATIVE
Basophils Absolute: 0 10*3/uL (ref 0.0–0.2)
Basos: 0 %
EOS (ABSOLUTE): 0.1 10*3/uL (ref 0.0–0.4)
Eos: 1 %
HCV Ab: NONREACTIVE
HIV Screen 4th Generation wRfx: NONREACTIVE
Hematocrit: 31 % — ABNORMAL LOW (ref 34.0–46.6)
Hemoglobin: 10.7 g/dL — ABNORMAL LOW (ref 11.1–15.9)
Hepatitis B Surface Ag: NEGATIVE
Immature Grans (Abs): 0 10*3/uL (ref 0.0–0.1)
Immature Granulocytes: 0 %
Lymphocytes Absolute: 1.6 10*3/uL (ref 0.7–3.1)
Lymphs: 19 %
MCH: 30.3 pg (ref 26.6–33.0)
MCHC: 34.5 g/dL (ref 31.5–35.7)
MCV: 88 fL (ref 79–97)
Monocytes Absolute: 0.5 10*3/uL (ref 0.1–0.9)
Monocytes: 6 %
Neutrophils Absolute: 6.2 10*3/uL (ref 1.4–7.0)
Neutrophils: 74 %
Platelets: 291 10*3/uL (ref 150–450)
RBC: 3.53 x10E6/uL — ABNORMAL LOW (ref 3.77–5.28)
RDW: 12.9 % (ref 11.7–15.4)
RPR Ser Ql: NONREACTIVE
Rh Factor: POSITIVE
Rubella Antibodies, IGG: 1.15 index (ref >0.99)
WBC: 8.4 10*3/uL (ref 3.4–10.8)

## 2022-03-13 LAB — CERVICOVAGINAL ANCILLARY ONLY
Bacterial Vaginitis (gardnerella): POSITIVE — AB
Chlamydia: NEGATIVE
Comment: NEGATIVE
Comment: NEGATIVE
Comment: NEGATIVE
Comment: NORMAL
Neisseria Gonorrhea: NEGATIVE
Trichomonas: NEGATIVE

## 2022-03-13 LAB — HEMOGLOBIN A1C
Est. average glucose Bld gHb Est-mCnc: 103 mg/dL
Hgb A1c MFr Bld: 5.2 % (ref 4.8–5.6)

## 2022-03-13 LAB — HCV INTERPRETATION

## 2022-03-14 LAB — CULTURE, OB URINE

## 2022-03-14 LAB — URINE CULTURE, OB REFLEX

## 2022-03-18 ENCOUNTER — Encounter: Payer: Self-pay | Admitting: Obstetrics and Gynecology

## 2022-03-25 LAB — CYTOLOGY - PAP
Chlamydia: NEGATIVE
Comment: NEGATIVE
Comment: NEGATIVE
Comment: NEGATIVE
Comment: NORMAL
Diagnosis: HIGH — AB
HPV 16: NEGATIVE
HPV 18 / 45: NEGATIVE
High risk HPV: POSITIVE — AB
Neisseria Gonorrhea: NEGATIVE

## 2022-03-28 ENCOUNTER — Encounter: Payer: Self-pay | Admitting: Obstetrics and Gynecology

## 2022-03-28 DIAGNOSIS — R87619 Unspecified abnormal cytological findings in specimens from cervix uteri: Secondary | ICD-10-CM | POA: Insufficient documentation

## 2022-03-31 ENCOUNTER — Telehealth: Payer: Self-pay | Admitting: Emergency Medicine

## 2022-03-31 NOTE — Telephone Encounter (Signed)
Attempt call to pt, person who answered phone states she will have pt return call to clinic.

## 2022-04-01 ENCOUNTER — Encounter: Payer: Self-pay | Admitting: Obstetrics and Gynecology

## 2022-04-02 ENCOUNTER — Telehealth: Payer: Self-pay | Admitting: Emergency Medicine

## 2022-04-02 DIAGNOSIS — B9689 Other specified bacterial agents as the cause of diseases classified elsewhere: Secondary | ICD-10-CM

## 2022-04-02 MED ORDER — METRONIDAZOLE 0.75 % VA GEL: 1.0000 | Freq: Every day | VAGINAL | 1 refills | Status: DC

## 2022-04-02 NOTE — Telephone Encounter (Signed)
TC to patient to discuss results. Pt transferred to front desk to schedule colpo.  Rx sent for BV

## 2022-04-03 ENCOUNTER — Ambulatory Visit: Payer: Medicaid Other | Attending: Obstetrics and Gynecology

## 2022-04-03 ENCOUNTER — Encounter: Payer: Self-pay | Admitting: *Deleted

## 2022-04-03 ENCOUNTER — Ambulatory Visit: Payer: Medicaid Other | Admitting: *Deleted

## 2022-04-03 VITALS — BP 108/59 | HR 77

## 2022-04-03 DIAGNOSIS — Z363 Encounter for antenatal screening for malformations: Secondary | ICD-10-CM | POA: Diagnosis not present

## 2022-04-03 DIAGNOSIS — O0933 Supervision of pregnancy with insufficient antenatal care, third trimester: Secondary | ICD-10-CM | POA: Insufficient documentation

## 2022-04-03 DIAGNOSIS — O358XX0 Maternal care for other (suspected) fetal abnormality and damage, not applicable or unspecified: Secondary | ICD-10-CM | POA: Insufficient documentation

## 2022-04-03 DIAGNOSIS — Z3480 Encounter for supervision of other normal pregnancy, unspecified trimester: Secondary | ICD-10-CM | POA: Diagnosis not present

## 2022-04-03 DIAGNOSIS — Z3A35 35 weeks gestation of pregnancy: Secondary | ICD-10-CM | POA: Diagnosis not present

## 2022-04-07 ENCOUNTER — Other Ambulatory Visit: Payer: Self-pay | Admitting: *Deleted

## 2022-04-07 DIAGNOSIS — Z362 Encounter for other antenatal screening follow-up: Secondary | ICD-10-CM

## 2022-04-07 DIAGNOSIS — Z3689 Encounter for other specified antenatal screening: Secondary | ICD-10-CM

## 2022-04-09 ENCOUNTER — Encounter: Payer: Medicaid Other | Admitting: Obstetrics

## 2022-04-09 ENCOUNTER — Other Ambulatory Visit: Payer: Medicaid Other

## 2022-04-29 ENCOUNTER — Other Ambulatory Visit: Payer: Medicaid Other

## 2022-04-29 ENCOUNTER — Other Ambulatory Visit: Payer: Self-pay

## 2022-04-29 DIAGNOSIS — Z3A29 29 weeks gestation of pregnancy: Secondary | ICD-10-CM

## 2022-04-29 DIAGNOSIS — Z3480 Encounter for supervision of other normal pregnancy, unspecified trimester: Secondary | ICD-10-CM

## 2022-04-30 ENCOUNTER — Ambulatory Visit: Payer: Medicaid Other

## 2022-04-30 ENCOUNTER — Telehealth: Payer: Self-pay | Admitting: Emergency Medicine

## 2022-04-30 ENCOUNTER — Ambulatory Visit: Payer: Medicaid Other | Attending: Obstetrics

## 2022-04-30 LAB — CBC
Hematocrit: 32.7 % — ABNORMAL LOW (ref 34.0–46.6)
Hemoglobin: 10.9 g/dL — ABNORMAL LOW (ref 11.1–15.9)
MCH: 28.8 pg (ref 26.6–33.0)
MCHC: 33.3 g/dL (ref 31.5–35.7)
MCV: 86 fL (ref 79–97)
Platelets: 265 10*3/uL (ref 150–450)
RBC: 3.79 x10E6/uL (ref 3.77–5.28)
RDW: 13.1 % (ref 11.7–15.4)
WBC: 11 10*3/uL — ABNORMAL HIGH (ref 3.4–10.8)

## 2022-04-30 LAB — HIV ANTIBODY (ROUTINE TESTING W REFLEX): HIV Screen 4th Generation wRfx: NONREACTIVE

## 2022-04-30 LAB — GLUCOSE TOLERANCE, 2 HOURS W/ 1HR
Glucose, 1 hour: 105 mg/dL (ref 70–179)
Glucose, 2 hour: 84 mg/dL (ref 70–152)
Glucose, Fasting: 75 mg/dL (ref 70–91)

## 2022-04-30 LAB — RPR: RPR Ser Ql: NONREACTIVE

## 2022-04-30 NOTE — Telephone Encounter (Signed)
Attempted TC to discuss gtt results. LVM

## 2022-05-01 ENCOUNTER — Telehealth: Payer: Self-pay | Admitting: Emergency Medicine

## 2022-05-01 NOTE — Telephone Encounter (Signed)
Attempted TC to patient to discuss negative Gtt. Pt to be made aware of normal results on 6/30 visit.

## 2022-05-08 ENCOUNTER — Encounter: Payer: Medicaid Other | Admitting: Obstetrics and Gynecology

## 2022-05-28 ENCOUNTER — Ambulatory Visit: Payer: Medicaid Other | Attending: Obstetrics | Admitting: *Deleted

## 2022-05-28 ENCOUNTER — Ambulatory Visit (HOSPITAL_BASED_OUTPATIENT_CLINIC_OR_DEPARTMENT_OTHER): Payer: Medicaid Other

## 2022-05-28 VITALS — BP 125/64 | HR 84

## 2022-05-28 DIAGNOSIS — Z3A33 33 weeks gestation of pregnancy: Secondary | ICD-10-CM

## 2022-05-28 DIAGNOSIS — Z363 Encounter for antenatal screening for malformations: Secondary | ICD-10-CM | POA: Diagnosis not present

## 2022-05-28 DIAGNOSIS — O09293 Supervision of pregnancy with other poor reproductive or obstetric history, third trimester: Secondary | ICD-10-CM

## 2022-05-28 DIAGNOSIS — O0933 Supervision of pregnancy with insufficient antenatal care, third trimester: Secondary | ICD-10-CM | POA: Diagnosis not present

## 2022-05-28 DIAGNOSIS — Z362 Encounter for other antenatal screening follow-up: Secondary | ICD-10-CM

## 2022-05-28 DIAGNOSIS — Z3689 Encounter for other specified antenatal screening: Secondary | ICD-10-CM

## 2022-05-28 DIAGNOSIS — Z3480 Encounter for supervision of other normal pregnancy, unspecified trimester: Secondary | ICD-10-CM

## 2022-06-15 ENCOUNTER — Ambulatory Visit: Payer: Self-pay

## 2022-06-15 NOTE — Telephone Encounter (Signed)
Summary: rx concern   The patient would like to speak with a member of clinical staff regarding muscle relaxers   The patient is currently [redacted] weeks pregnant and interested in taking some   Please contact further when possible   The patient has not taken any at the time of call with agent      Called pt - [id number]

## 2022-06-15 NOTE — Telephone Encounter (Signed)
Patient called, left VM to return the call to the office to discuss symptoms with a nurse. Summary: rx concern    The patient would like to speak with a member of clinical staff regarding muscle relaxers   The patient is currently [redacted] weeks pregnant and interested in taking some   Please contact further when possible   The patient has not taken any at the time of call with agent

## 2022-06-15 NOTE — Telephone Encounter (Signed)
Summary: rx concern   The patient would like to speak with a member of clinical staff regarding muscle relaxers   The patient is currently [redacted] weeks pregnant and interested in taking some   Please contact further when possible   The patient has not taken any at the time of call with agent     Called pt [email or web address].  Tried the other 2 numbers - neither are good numbers.

## 2022-06-21 ENCOUNTER — Inpatient Hospital Stay (HOSPITAL_COMMUNITY)
Admission: AD | Admit: 2022-06-21 | Discharge: 2022-06-22 | Disposition: A | Discharge status: Home / Self Care | Payer: Medicaid Other | Attending: Obstetrics and Gynecology | Admitting: Obstetrics and Gynecology

## 2022-06-21 ENCOUNTER — Encounter (HOSPITAL_COMMUNITY): Payer: Self-pay | Admitting: Obstetrics and Gynecology

## 2022-06-21 ENCOUNTER — Other Ambulatory Visit: Payer: Self-pay

## 2022-06-21 DIAGNOSIS — Z3A37 37 weeks gestation of pregnancy: Secondary | ICD-10-CM

## 2022-06-21 DIAGNOSIS — O479 False labor, unspecified: Secondary | ICD-10-CM

## 2022-06-21 DIAGNOSIS — O471 False labor at or after 37 completed weeks of gestation: Secondary | ICD-10-CM | POA: Insufficient documentation

## 2022-06-21 NOTE — MAU Note (Signed)
.  Carrie Nielsen is a 31 y.o. at [redacted]w[redacted]d here in MAU reporting: pressure and lower back pain that started around 1600 when she got off of work. States she tried to lay down, but she was unable to sleep due to the pain. States "it felt like contractions, but really thin ones.. not strong" states she has not been to an appointment recently. Denies VB or LOF. +FM.  Onset of complaint: 1600 Pain score: 7  Vitals:   06/21/22 2300  BP: 120/78  Pulse: (!) 106  Resp: 19  Temp: 98 F (36.7 C)  SpO2: 99%     FHT: will apply EFM in room  Lab orders placed from triage:  none

## 2022-06-22 DIAGNOSIS — Z3A37 37 weeks gestation of pregnancy: Secondary | ICD-10-CM

## 2022-06-22 DIAGNOSIS — O479 False labor, unspecified: Secondary | ICD-10-CM | POA: Diagnosis not present

## 2022-06-22 DIAGNOSIS — O471 False labor at or after 37 completed weeks of gestation: Secondary | ICD-10-CM | POA: Diagnosis present

## 2022-06-22 MED ORDER — LACTATED RINGERS IV BOLUS
1000.0000 mL | Freq: Once | INTRAVENOUS | Status: AC
  Administered 2022-06-22: 1000 mL via INTRAVENOUS

## 2022-06-22 MED ORDER — FENTANYL CITRATE (PF) 100 MCG/2ML IJ SOLN
100.0000 ug | Freq: Once | INTRAMUSCULAR | Status: AC
  Administered 2022-06-22: 100 ug via INTRAVENOUS
  Filled 2022-06-22: qty 2

## 2022-06-22 NOTE — MAU Provider Note (Signed)
  S: Ms. Raghad Lorenz is a 31 y.o. [phone] at [redacted]w[redacted]d  who presents to MAU today complaining of pelvic pressure and back pain. She denies LOF. She reports normal fetal movement.    O: BP 102/85   Pulse 77   Temp 98 F (36.7 C) (Oral)   Resp 19   Ht 6' (1.829 m)   Wt 90.4 kg   LMP 10/06/2021 (Approximate)   SpO2 97%   BMI 27.04 kg/m  GENERAL: Well-developed, well-nourished female in no acute distress.  HEAD: Normocephalic, atraumatic.  CHEST: Normal effort of breathing, regular heart rate ABDOMEN: Soft, nontender, gravid  Cervical exam:  Dilation: 2.5 Effacement (%): 70 Cervical Position: Posterior Station: -2 Presentation: Vertex Exam by:: Felipa Furnace RN   Fetal Monitoring: Baseline: 120 Variability: moderate Accelerations: > 2 15 X 15 accels Decelerations: no decels Contractions: every 4-8 mins.   A: SIUP at [redacted]w[redacted]d  False labor  vs early labor  P: Pain improved with analgesia. Made little cervical change in the MAU. Given [redacted] week gestation and minimal change, I recommend discharging home with labor precautions Fetal well being: category 1.  Alfredia Ferguson, MD 06/22/2022 4:02 AM

## 2022-06-24 ENCOUNTER — Ambulatory Visit (INDEPENDENT_AMBULATORY_CARE_PROVIDER_SITE_OTHER): Payer: Medicaid Other | Admitting: Obstetrics and Gynecology

## 2022-06-24 ENCOUNTER — Encounter: Payer: Self-pay | Admitting: Obstetrics and Gynecology

## 2022-06-24 ENCOUNTER — Other Ambulatory Visit (HOSPITAL_COMMUNITY)
Admission: RE | Admit: 2022-06-24 | Discharge: 2022-06-24 | Disposition: A | Discharge status: Home / Self Care | Payer: Medicaid Other | Source: Ambulatory Visit | Attending: Obstetrics and Gynecology | Admitting: Obstetrics and Gynecology

## 2022-06-24 VITALS — BP 114/74 | HR 87 | Wt 202.0 lb

## 2022-06-24 DIAGNOSIS — Z3A37 37 weeks gestation of pregnancy: Secondary | ICD-10-CM | POA: Diagnosis not present

## 2022-06-24 DIAGNOSIS — Z8619 Personal history of other infectious and parasitic diseases: Secondary | ICD-10-CM

## 2022-06-24 DIAGNOSIS — Z3480 Encounter for supervision of other normal pregnancy, unspecified trimester: Secondary | ICD-10-CM

## 2022-06-24 DIAGNOSIS — Z3483 Encounter for supervision of other normal pregnancy, third trimester: Secondary | ICD-10-CM | POA: Diagnosis not present

## 2022-06-24 MED ORDER — CYCLOBENZAPRINE HCL 10 MG PO TABS: 10.0000 mg | ORAL_TABLET | Freq: Three times a day (TID) | ORAL | 1 refills | Status: DC | PRN

## 2022-06-24 NOTE — Patient Instructions (Signed)
Vaginal Delivery  Vaginal delivery means that you give birth by pushing your baby out of your birth canal (vagina). Your health care team will help you before, during, and after vaginal delivery. Birth experiences are unique for every woman and every pregnancy, and birth experiences vary depending on where you choose to give birth. What are the risks and benefits? Generally, this is safe. However, problems may occur, including: Bleeding. Infection. Damage to other structures such as vaginal tearing. Allergic reactions to medicines. Despite the risks, benefits of vaginal delivery include less risk of bleeding and infection and a shorter recovery time compared to a Cesarean delivery. Cesarean delivery, or C-section, is the surgical delivery of a baby. What happens when I arrive at the birth center or hospital? Once you are in labor and have been admitted into the hospital or birth center, your health care team may: Review your pregnancy history and any concerns that you have. Talk with you about your birth plan and discuss pain control options. Check your blood pressure, breathing, and heartbeat. Assess your baby's heartbeat. Monitor your uterus for contractions. Check whether your bag of water (amniotic sac) has broken (ruptured). Insert an IV into one of your veins. This may be used to give you fluids and medicines. Monitoring Your health care team may assess your contractions (uterine monitoring) and your baby's heart rate (fetal monitoring). You may need to be monitored: Often, but not continuously (intermittently). All the time or for long periods at a time (continuously). Continuous monitoring may be needed if: You are taking certain medicines, such as medicine to relieve pain or make your contractions stronger. You have pregnancy or labor complications. Monitoring may be done by: Placing a special stethoscope or a handheld monitoring device on your abdomen to check your baby's  heartbeat and to check for contractions. Placing monitors on your abdomen (external monitors) to record your baby's heartbeat and the frequency and length of contractions. Placing monitors inside your uterus through your vagina (internal monitors) to record your baby's heartbeat and the frequency, length, and strength of your contractions. Depending on the type of monitor, it may remain in your uterus or on your baby's head until birth. Telemetry. This is a type of continuous monitoring that can be done with external or internal monitors. Instead of having to stay in bed, you are able to move around. Physical exam Your health care team may perform frequent physical exams. This may include: Checking how and where your baby is positioned in your uterus. Checking your cervix to determine: Whether it is thinning out (effacing). Whether it is opening up (dilating). What happens during labor and delivery?  Normal labor and delivery is divided into the following three stages: Stage 1 This is the longest stage of labor. Throughout this stage, you will feel contractions. Contractions generally feel mild, infrequent, and irregular at first. They get stronger, more frequent, and more regular as you move through this stage. You may have contractions about every 2-3 minutes. This stage ends when your cervix is completely dilated to 4 inches (10 cm) and completely effaced. Stage 2 This stage starts once your cervix is completely effaced and dilated and lasts until the delivery of your baby. This is the stage where you will feel an urge to push your baby out of your vagina. You may feel stretching and burning pain, especially when the widest part of your baby's head passes through the vaginal opening (crowning). Once your baby is delivered, the umbilical cord will be   clamped and cut. Timing of cutting the cord will depend on your wishes, your baby's health, and your health care provider's practices. Your baby  will be placed on your bare chest (skin-to-skin contact) in an upright position and covered with a warm blanket. If you are choosing to breastfeed, watch your baby for feeding cues, like rooting or sucking, and help the baby to your breast for his or her first feeding. Stage 3 This stage starts immediately after the birth of your baby and ends after you deliver the placenta. This stage may take anywhere from 5 to 30 minutes. After your baby has been delivered, you will feel contractions as your body expels the placenta. These contractions also help your uterus get smaller and reduce bleeding. What can I expect after labor and delivery? After labor is over, you and your baby will be assessed closely until you are ready to go home. Your health care team will teach you how to care for yourself and your baby. You and your baby may be encouraged to stay in the same room (rooming in) during your hospital stay. This will help promote early bonding and successful breastfeeding. Your uterus will be checked and massaged regularly (fundal massage). You may continue to receive fluids and medicines through an IV. You will have some soreness and pain in your abdomen, vagina, and the area of skin between your vaginal opening and your anus (perineum). If an incision was made near your vagina (episiotomy) or if you had some vaginal tearing during delivery, cold compresses may be placed on your episiotomy or your tear. This helps to reduce pain and swelling. It is normal to have vaginal bleeding after delivery. Wear a sanitary pad for vaginal bleeding and discharge. Summary Vaginal delivery means that you will give birth by pushing your baby out of your birth canal (vagina). Your health care team will monitor you and your baby throughout the stages of labor. After you deliver your baby, your health care team will continue to assess you and your baby to ensure you are both recovering as expected after delivery. This  information is not intended to replace advice given to you by your health care provider. Make sure you discuss any questions you have with your health care provider. Document Revised: 09/23/2020 Document Reviewed: 09/23/2020 Elsevier Patient Education  2023 [employer]  

## 2022-06-24 NOTE — Progress Notes (Signed)
Subjective:  Carrie Nielsen is a 31 y.o. [phone] at [redacted]w[redacted]d being seen today for ongoing prenatal care.  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  Patient reports general discomforts of pregnancy.  Contractions: Irregular. Vag. Bleeding: None.  Movement: Present. Denies leaking of fluid.   The following portions of the patient's history were reviewed and updated as appropriate: allergies, current medications, past family history, past medical history, past social history, past surgical history and problem list. Problem list updated.  Objective:   Vitals:   06/24/22 1345  BP: 114/74  Pulse: 87  Weight: 202 lb (91.6 kg)    Fetal Status: Fetal Heart Rate (bpm): 135   Movement: Present     General:  Alert, oriented and cooperative. Patient is in no acute distress.  Skin: Skin is warm and dry. No rash noted.   Cardiovascular: Normal heart rate noted  Respiratory: Normal respiratory effort, no problems with respiration noted  Abdomen: Soft, gravid, appropriate for gestational age. Pain/Pressure: Present     Pelvic:  Cervical exam performed        Extremities: Normal range of motion.     Mental Status: Normal mood and affect. Normal behavior. Normal judgment and thought content.   Urinalysis:      Assessment and Plan:  Pregnancy: [id number] at [redacted]w[redacted]d  1. Supervision of other normal pregnancy, antepartum Labor precautions - Culture, beta strep (group b only) - Cervicovaginal ancillary only( [place])  2. History of herpes genitalis Continue with Valtrex suppression  Term labor symptoms and general obstetric precautions including but not limited to vaginal bleeding, contractions, leaking  of fluid and fetal movement were reviewed in detail with the patient. Please refer to After Visit Summary for other counseling recommendations.  Return in about 1 week (around 07/01/2022) for OB visit, face to face, any provider.   Hermina Staggers, MD

## 2022-06-24 NOTE — Progress Notes (Signed)
Pt complains of low back pain that shoots down leg. Pt has recent MAU visit and had cervix check.

## 2022-06-25 LAB — CERVICOVAGINAL ANCILLARY ONLY
Bacterial Vaginitis (gardnerella): POSITIVE — AB
Candida Glabrata: NEGATIVE
Candida Vaginitis: NEGATIVE
Chlamydia: NEGATIVE
Comment: NEGATIVE
Comment: NEGATIVE
Comment: NEGATIVE
Comment: NEGATIVE
Comment: NEGATIVE
Comment: NORMAL
Neisseria Gonorrhea: NEGATIVE
Trichomonas: NEGATIVE

## 2022-06-26 ENCOUNTER — Telehealth: Payer: Self-pay

## 2022-06-26 MED ORDER — METRONIDAZOLE 500 MG PO TABS: 500.0000 mg | ORAL_TABLET | Freq: Two times a day (BID) | ORAL | 0 refills | Status: DC

## 2022-06-26 NOTE — Telephone Encounter (Signed)
Attempted to contact x2 about results, phone # is not in service. Rx sent to pharmacy

## 2022-06-28 LAB — CULTURE, BETA STREP (GROUP B ONLY): Strep Gp B Culture: NEGATIVE

## 2022-07-02 ENCOUNTER — Ambulatory Visit (INDEPENDENT_AMBULATORY_CARE_PROVIDER_SITE_OTHER): Payer: Medicaid Other | Admitting: Obstetrics and Gynecology

## 2022-07-02 VITALS — BP 114/72 | HR 90 | Wt 206.2 lb

## 2022-07-02 DIAGNOSIS — Z3A38 38 weeks gestation of pregnancy: Secondary | ICD-10-CM

## 2022-07-02 DIAGNOSIS — Z3480 Encounter for supervision of other normal pregnancy, unspecified trimester: Secondary | ICD-10-CM

## 2022-07-02 DIAGNOSIS — Z8619 Personal history of other infectious and parasitic diseases: Secondary | ICD-10-CM | POA: Diagnosis not present

## 2022-07-02 DIAGNOSIS — Z3483 Encounter for supervision of other normal pregnancy, third trimester: Secondary | ICD-10-CM | POA: Diagnosis not present

## 2022-07-02 NOTE — Progress Notes (Signed)
   PRENATAL VISIT NOTE  Subjective:  Carrie Nielsen is a 31 y.o. [phone] at [redacted]w[redacted]d being seen today for ongoing prenatal care.  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  Patient doing well with no acute concerns today. She reports no complaints.  Contractions: Not present. Vag. Bleeding: None.  Movement: Present. Denies leaking of fluid.   The following portions of the patient's history were reviewed and updated as appropriate: allergies, current medications, past family history, past medical history, past social history, past surgical history and problem list. Problem list updated.  Objective:   Vitals:   07/02/22 1425  BP: 114/72  Pulse: 90  Weight: 206 lb 3.2 oz (93.5 kg)    Fetal Status: Fetal Heart Rate (bpm): 145 Fundal Height: 38 cm Movement: Present     General:  Alert, oriented and cooperative. Patient is in no acute distress.  Skin: Skin is warm and dry. No rash noted.   Cardiovascular: Normal heart rate noted  Respiratory: Normal respiratory effort, no problems with respiration noted  Abdomen: Soft, gravid, appropriate for gestational age.  Pain/Pressure: Absent     Pelvic: Cervical exam deferred        Extremities: Normal range of motion.  Edema: None  Mental Status:  Normal mood and affect. Normal behavior. Normal judgment and thought content.   Assessment and Plan:  Pregnancy: [id number] at [redacted]w[redacted]d  1. Supervision of other normal pregnancy, antepartum Continue routine prenatal care Consider discussion of IOL at next visit  2. History of herpes genitalis Pt notes compliance with valtrex  3. [redacted] weeks gestation of pregnancy   Term labor symptoms and general obstetric  precautions including but not limited to vaginal bleeding, contractions, leaking of fluid and fetal movement were reviewed in detail with the patient.  Please refer to After Visit Summary for other counseling recommendations.   Return in about 1 week (around 07/09/2022) for ROB, in person.   Mariel Aloe, MD [facility] Attending [facility]

## 2022-07-02 NOTE — Progress Notes (Signed)
Pt reports fetal movement, denies pain.  

## 2022-07-05 ENCOUNTER — Inpatient Hospital Stay (HOSPITAL_COMMUNITY): Payer: Medicaid Other | Admitting: Anesthesiology

## 2022-07-05 ENCOUNTER — Inpatient Hospital Stay (HOSPITAL_COMMUNITY)
Admission: AD | Admit: 2022-07-05 | Discharge: 2022-07-07 | DRG: 807 | Disposition: A | Discharge status: Home / Self Care | Payer: Medicaid Other | Attending: Obstetrics and Gynecology | Admitting: Obstetrics and Gynecology

## 2022-07-05 ENCOUNTER — Other Ambulatory Visit: Payer: Self-pay

## 2022-07-05 ENCOUNTER — Encounter (HOSPITAL_COMMUNITY): Payer: Self-pay | Admitting: Obstetrics & Gynecology

## 2022-07-05 DIAGNOSIS — K219 Gastro-esophageal reflux disease without esophagitis: Secondary | ICD-10-CM | POA: Diagnosis present

## 2022-07-05 DIAGNOSIS — O9962 Diseases of the digestive system complicating childbirth: Secondary | ICD-10-CM | POA: Diagnosis present

## 2022-07-05 DIAGNOSIS — Z3009 Encounter for other general counseling and advice on contraception: Secondary | ICD-10-CM

## 2022-07-05 DIAGNOSIS — Z30430 Encounter for insertion of intrauterine contraceptive device: Secondary | ICD-10-CM | POA: Diagnosis not present

## 2022-07-05 DIAGNOSIS — O9852 Other viral diseases complicating childbirth: Secondary | ICD-10-CM | POA: Diagnosis present

## 2022-07-05 DIAGNOSIS — Z3A38 38 weeks gestation of pregnancy: Secondary | ICD-10-CM

## 2022-07-05 DIAGNOSIS — Z975 Presence of (intrauterine) contraceptive device: Secondary | ICD-10-CM

## 2022-07-05 DIAGNOSIS — B001 Herpesviral vesicular dermatitis: Secondary | ICD-10-CM | POA: Diagnosis present

## 2022-07-05 DIAGNOSIS — O26893 Other specified pregnancy related conditions, third trimester: Secondary | ICD-10-CM | POA: Diagnosis present

## 2022-07-05 HISTORY — PX: IUD INSERTION: OBO1003

## 2022-07-05 LAB — POCT FERN TEST: POCT Fern Test: NEGATIVE

## 2022-07-05 LAB — CBC
HCT: 31 % — ABNORMAL LOW (ref 36.0–46.0)
Hemoglobin: 10.2 g/dL — ABNORMAL LOW (ref 12.0–15.0)
MCH: 27.2 pg (ref 26.0–34.0)
MCHC: 32.9 g/dL (ref 30.0–36.0)
MCV: 82.7 fL (ref 80.0–100.0)
Platelets: 283 10*3/uL (ref 150–400)
RBC: 3.75 MIL/uL — ABNORMAL LOW (ref 3.87–5.11)
RDW: 14.7 % (ref 11.5–15.5)
WBC: 16.8 10*3/uL — ABNORMAL HIGH (ref 4.0–10.5)
nRBC: 0 % (ref 0.0–0.2)

## 2022-07-05 LAB — TYPE AND SCREEN
ABO/RH(D): O POS
Antibody Screen: NEGATIVE

## 2022-07-05 MED ORDER — EPHEDRINE 5 MG/ML INJ: 10.0000 mg | INTRAVENOUS | Status: DC | PRN

## 2022-07-05 MED ORDER — LIDOCAINE HCL (PF) 1 % IJ SOLN: 30.0000 mL | INTRAMUSCULAR | Status: DC | PRN

## 2022-07-05 MED ORDER — FENTANYL-BUPIVACAINE-NACL 0.5-0.125-0.9 MG/250ML-% EP SOLN: 12.0000 mL/h | EPIDURAL | Status: DC | PRN

## 2022-07-05 MED ORDER — OXYCODONE-ACETAMINOPHEN 5-325 MG PO TABS: 1.0000 | ORAL_TABLET | ORAL | Status: DC | PRN

## 2022-07-05 MED ORDER — OXYTOCIN-SODIUM CHLORIDE 30-0.9 UT/500ML-% IV SOLN
2.5000 [IU]/h | INTRAVENOUS | Status: DC
  Filled 2022-07-05: qty 500

## 2022-07-05 MED ORDER — LEVONORGESTREL 20 MCG/DAY IU IUD
1.0000 | INTRAUTERINE_SYSTEM | Freq: Once | INTRAUTERINE | Status: AC
Start: 2022-07-06 — End: 2022-07-05
  Administered 2022-07-05: 1 via INTRAUTERINE
  Filled 2022-07-05: qty 1

## 2022-07-05 MED ORDER — LIDOCAINE HCL (PF) 1 % IJ SOLN
INTRAMUSCULAR | Status: DC | PRN
  Administered 2022-07-05: 8 mL via EPIDURAL

## 2022-07-05 MED ORDER — LACTATED RINGERS IV SOLN: INTRAVENOUS | Status: DC

## 2022-07-05 MED ORDER — SOD CITRATE-CITRIC ACID 500-334 MG/5ML PO SOLN: 30.0000 mL | ORAL | Status: DC | PRN

## 2022-07-05 MED ORDER — PHENYLEPHRINE 80 MCG/ML (10ML) SYRINGE FOR IV PUSH (FOR BLOOD PRESSURE SUPPORT): 80.0000 ug | PREFILLED_SYRINGE | INTRAVENOUS | Status: DC | PRN

## 2022-07-05 MED ORDER — DIPHENHYDRAMINE HCL 50 MG/ML IJ SOLN: 12.5000 mg | INTRAMUSCULAR | Status: DC | PRN

## 2022-07-05 MED ORDER — PHENYLEPHRINE 80 MCG/ML (10ML) SYRINGE FOR IV PUSH (FOR BLOOD PRESSURE SUPPORT)
80.0000 ug | PREFILLED_SYRINGE | INTRAVENOUS | Status: DC | PRN
Start: 2022-07-05 — End: 2022-07-06
  Filled 2022-07-05: qty 10

## 2022-07-05 MED ORDER — VALACYCLOVIR HCL 500 MG PO TABS
500.0000 mg | ORAL_TABLET | Freq: Two times a day (BID) | ORAL | Status: DC
  Administered 2022-07-05: 500 mg via ORAL
  Filled 2022-07-05: qty 1

## 2022-07-05 MED ORDER — ACETAMINOPHEN 325 MG PO TABS
650.0000 mg | ORAL_TABLET | ORAL | Status: DC | PRN
  Administered 2022-07-06: 650 mg via ORAL
  Filled 2022-07-05: qty 2

## 2022-07-05 MED ORDER — LACTATED RINGERS IV SOLN: 500.0000 mL | Freq: Once | INTRAVENOUS | Status: DC

## 2022-07-05 MED ORDER — OXYCODONE-ACETAMINOPHEN 5-325 MG PO TABS: 2.0000 | ORAL_TABLET | ORAL | Status: DC | PRN

## 2022-07-05 MED ORDER — FENTANYL-BUPIVACAINE-NACL 0.5-0.125-0.9 MG/250ML-% EP SOLN
12.0000 mL/h | EPIDURAL | Status: DC | PRN
  Administered 2022-07-05: 12 mL/h via EPIDURAL
  Filled 2022-07-05: qty 250

## 2022-07-05 MED ORDER — ONDANSETRON HCL 4 MG/2ML IJ SOLN: 4.0000 mg | Freq: Four times a day (QID) | INTRAMUSCULAR | Status: DC | PRN

## 2022-07-05 MED ORDER — DIPHENHYDRAMINE HCL 50 MG/ML IJ SOLN
12.5000 mg | INTRAMUSCULAR | Status: DC | PRN
  Administered 2022-07-05: 12.5 mg via INTRAVENOUS
  Filled 2022-07-05: qty 1

## 2022-07-05 MED ORDER — LACTATED RINGERS IV SOLN: 500.0000 mL | INTRAVENOUS | Status: DC | PRN

## 2022-07-05 MED ORDER — OXYTOCIN BOLUS FROM INFUSION
333.0000 mL | Freq: Once | INTRAVENOUS | Status: AC
  Administered 2022-07-05: 333 mL via INTRAVENOUS

## 2022-07-05 NOTE — Anesthesia Preprocedure Evaluation (Signed)
Anesthesia Evaluation  Patient identified by MRN, date of birth, ID band Patient awake    Reviewed: Allergy & Precautions, H&P , NPO status , Patient's Chart, lab work & pertinent test results, reviewed documented beta blocker date and time   Airway Mallampati: II  TM Distance: >3 FB Neck ROM: full    Dental no notable dental hx. (+) Teeth Intact, Dental Advisory Given   Pulmonary neg pulmonary ROS,    Pulmonary exam normal breath sounds clear to auscultation       Cardiovascular hypertension, negative cardio ROS Normal cardiovascular exam Rhythm:regular Rate:Normal     Neuro/Psych negative neurological ROS  negative psych ROS   GI/Hepatic Neg liver ROS, GERD  Medicated,  Endo/Other  negative endocrine ROS  Renal/GU negative Renal ROS  negative genitourinary   Musculoskeletal   Abdominal   Peds  Hematology negative hematology ROS (+)   Anesthesia Other Findings   Reproductive/Obstetrics (+) Pregnancy                             Anesthesia Physical Anesthesia Plan  ASA: 2  Anesthesia Plan: Epidural   Post-op Pain Management: Minimal or no pain anticipated   Induction:   PONV Risk Score and Plan: 2 and Treatment may vary due to age or medical condition  Airway Management Planned: Natural Airway  Additional Equipment: None  Intra-op Plan:   Post-operative Plan:   Informed Consent: I have reviewed the patients History and Physical, chart, labs and discussed the procedure including the risks, benefits and alternatives for the proposed anesthesia with the patient or authorized representative who has indicated his/her understanding and acceptance.     Dental Advisory Given  Plan Discussed with: Anesthesiologist  Anesthesia Plan Comments: (Labs checked- platelets confirmed with RN in room. Fetal heart tracing, per RN, reported to be stable enough for sitting  procedure. Discussed epidural, and patient consents to the procedure:  included risk of possible headache,backache, failed block, allergic reaction, and nerve injury. This patient was asked if she had any questions or concerns before the procedure started.)        Anesthesia Quick Evaluation

## 2022-07-05 NOTE — MAU Note (Signed)
.  Carrie Nielsen is a 31 y.o. at [redacted]w[redacted]d here in MAU reporting: ctx and leakage of fluid. LMP: 10/06/21 Onset of complaint: 1600 Pain score: 10/10   FHT:145

## 2022-07-05 NOTE — Discharge Summary (Signed)
Postpartum Discharge Summary      Patient Name: Carrie Nielsen DOB: 1991-10-23 MRN: [id number]  Date of admission: 07/05/2022 Delivery date:07/05/2022  Delivering provider: Gerlene Fee  Date of discharge: 07/07/2022  Admitting diagnosis: Normal labor [O80, Z37.9] Intrauterine pregnancy: [redacted]w[redacted]d    Secondary diagnosis:  Active Problems:   Unwanted fertility   IUD (intrauterine device) in place  Additional problems: N/A    Discharge diagnosis: Term Pregnancy Delivered                                              Post partum procedures: None Augmentation: AROM Complications: None  Hospital course: Onset of Labor With Vaginal Delivery      31y.o. yo [id number] admitted in Latent Labor on 07/05/2022. Patient had an uncomplicated labor course as follows:  Membrane Rupture Time/Date: 11:08 PM ,07/05/2022   Delivery Method:Vaginal, Spontaneous  Episiotomy: None  Lacerations:  None  Patient had an uncomplicated postpartum course.  She is ambulating, tolerating a regular diet, passing flatus, and urinating well. Patient is discharged home in stable condition on 07/07/22.  Newborn Data: Birth date:07/05/2022  Birth time:11:27 PM  Gender:Female  Living status:Living  Apgars:8 ,9  Weight:3790 g   Magnesium Sulfate received: No BMZ received: No Rhophylac:No MMR:No T-DaP:Given prenatally Flu: N/A Transfusion:No  Physical exam  Vitals:   07/06/22 0920 07/06/22 1300 07/06/22 2221 07/07/22 0551  BP: 114/74 119/72 117/64 122/81  Pulse: 74 65 77 76  Resp: 18  18   Temp: 98 F (36.7 C)  97.7 F (36.5 C)   TempSrc:   Oral   SpO2: 99%  100% 100%  Weight:      Height:       General: alert, cooperative, and no distress Lochia: appropriate Uterine Fundus: firm Incision: N/A DVT Evaluation: No evidence of DVT seen on physical exam. Labs: Lab Results  Component Value Date   WBC 16.8 (H) 07/05/2022   HGB 10.2 (L) 07/05/2022   HCT 31.0 (L) 07/05/2022   MCV 82.7  07/05/2022   PLT 283 07/05/2022      Latest Ref Rng & Units 12/26/2021   11:30 AM  CMP  Glucose 70 - 99 mg/dL 107   BUN 6 - 20 mg/dL 9   Creatinine 0.44 - 1.00 mg/dL 0.53   Sodium 135 - 145 mmol/L 134   Potassium 3.5 - 5.1 mmol/L 3.8   Chloride 98 - 111 mmol/L 105   CO2 22 - 32 mmol/L 22   Calcium 8.9 - 10.3 mg/dL 8.7   Total Protein 6.5 - 8.1 g/dL 6.6   Total Bilirubin 0.3 - 1.2 mg/dL 0.5   Alkaline Phos 38 - 126 U/L 54   AST 15 - 41 U/L 18   ALT 0 - 44 U/L 14    Edinburgh Score:    07/06/2022    3:58 AM  Edinburgh Postnatal Depression Scale Screening Tool  I have been able to laugh and see the funny side of things. 0  I have looked forward with enjoyment to things. 0  I have blamed myself unnecessarily when things went wrong. 0  I have been anxious or worried for no good reason. 0  I have felt scared or panicky for no good reason. 0  Things have been getting on top of me. 0  I have been so unhappy  that I have had difficulty sleeping. 0  I have felt sad or miserable. 0  I have been so unhappy that I have been crying. 0  The thought of harming myself has occurred to me. 0  Edinburgh Postnatal Depression Scale Total 0     After visit meds:  Allergies as of 07/07/2022   No Known Allergies      Medication List     STOP taking these medications    cyclobenzaprine 10 MG tablet Commonly known as: FLEXERIL   metroNIDAZOLE 500 MG tablet Commonly known as: FLAGYL   valACYclovir 500 MG tablet Commonly known as: VALTREX       TAKE these medications    acetaminophen 500 MG tablet Commonly known as: TYLENOL Take 1,000 mg by mouth 2 (two) times daily as needed (pain).   Blood Pressure Kit Devi 1 kit by Does not apply route once a week. Check Blood Pressure regularly and record readings into the [employer] App.  Large Cuff.  DX O90.0   PRENATAL GUMMIES PO Take 1 tablet by mouth daily in the afternoon.   TUMS CHEWY BITES PO Take 1 tablet by mouth 3 (three)  times daily as needed (heartburn).         Discharge home in stable condition Infant Feeding: Bottle Infant Disposition:home with mother Discharge instruction: per After Visit Summary and Postpartum booklet. Activity: Advance as tolerated. Pelvic rest for 6 weeks.  Diet: routine diet Future Appointments: Future Appointments  Date Time Provider [place]  08/19/2022 11:15 AM Chancy Milroy, MD [place] None   Follow up Visit:  Message sent to [facility] by [place] on 07/07/2022   Please schedule this patient for a In person postpartum visit in 4 weeks with the following provider: Any provider. Additional Postpartum F/U: IUD check, strings may need to be trimmed.    Low risk pregnancy complicated by:  N/A Delivery mode:  Vaginal, Spontaneous  Anticipated Birth Control:  PP IUD placed   Concepcion Living, MD

## 2022-07-05 NOTE — H&P (Cosign Needed Addendum)
OBSTETRIC ADMISSION HISTORY AND PHYSICAL  Carrie Nielsen is a 31 y.o. female [phone] with IUP at 32w6dby LMP presenting for contractions with leakage of fluid. She reports +FMs, No LOF, no VB, no blurry vision, headaches or peripheral edema, and RUQ pain.  She plans on bottle feeding. She request Depo for birth control. [facility]  Dating: By LMP --->  Estimated Date of Delivery: 07/13/22  Sono:    @[redacted]w[redacted]d , CWD, normal anatomy, cephalic presentation, 24403g, 86% EFW   Nursing Staff Provider  Office Location  FDeseretDating  LMP (uncertain)/26 wk [place] [facility] [Valu.Nieves] Traditional [ ]  Centering [ ]  Mom-Baby Dyad    Language   ENGLISH Anatomy [place] Nml  Flu Vaccine   DECLINED Genetic/Carrier Screen  NIPS:   Low risk female AFP: Too late   Horizon: Nml  TDaP Vaccine   VIS provided 07/02/22 Hgb A1C or  GTT Early  Third trimester normal  COVID Vaccine  NO   LAB RESULTS   Rhogam  NA Blood Type O/Positive/-- (05/04 1128)   Baby Feeding Plan BOTTLE Antibody Negative (05/04 1128)  Contraception  DEPO Rubella 1.15 (05/04 1128)  Circumcision  YES RPR Non Reactive (06/21 1154)   Pediatrician   [place] PED[facility] HBsAg Negative (05/04 1128)   Support Person  FOB HCVAb  Neg  Prenatal Classes  HIV Non Reactive (06/21 1154)     BTL Consent  GBS Negative/-- (08/16 1429)  negative  VBAC Consent NA Pap HSIL + HR HPV       DME Rx [Valu.Nieves] BP cuff [ ]  Weight Scale Waterbirth  [ ]  Class [ ]  Consent [ ]  CNM visit  PHQ9 & GAD7 [Valu.Nieves ] new OB [  ] 28 weeks  [  ] 36 weeks Induction  [ ]  Orders Entered [ ] Foley Y/N     Prenatal History/Complications:  - Esophageal reflux - History of HSV, 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, compliant with Valtrex - ASCUS with positive high risk HPV cervical  Past Medical History: Past Medical History:  Diagnosis Date   Bartholin cyst 06/19/2021   History of cold sores 06/19/2021   Pregnancy induced hypertension    Vaginal Pap smear, abnormal 11/08/2020     Past Surgical History: Past Surgical History:  Procedure Laterality Date   BARTHOLIN GLAND CYST EXCISION N/A 06/24/2021   Procedure: BARTHOLIN GLAND EXCISION;  Surgeon: EChancy Milroy MD;  [facility]  Service: Gynecology;  Laterality: N/A;   WISDOM TOOTH EXTRACTION     yrs ago    Obstetrical History: OB History     Gravida  6   Para  3   Term  3   Preterm      AB  2   Living  3      SAB  1   IAB  1   Ectopic      Multiple  0   Live Births  3           Social History Social History   Socioeconomic History   Marital status: Single    Spouse name: Not on file   Number of children: 3   Years of education: Not on file   Highest education level: Not on file  Occupational History   Occupation: [employer]   Tobacco Use   Smoking status: Never   Smokeless tobacco: Never  Vaping Use   Vaping Use: Never  used  Substance and Sexual Activity   Alcohol use: No    Alcohol/week: 0.0 standard drinks of alcohol   Drug use: No   Sexual activity: Yes    Partners: Male    Birth control/protection: None  Other Topics Concern   Not on file  Social History Narrative   Not on file   Social Determinants of Health   Financial Resource Strain: Not on file  Food Insecurity: Not on file  Transportation Needs: Not on file  Physical Activity: Not on file  Stress: Not on file  Social Connections: Not on file    Family History: Family History  Problem Relation Age of Onset   Cancer Maternal Grandfather        brain   Alcohol abuse Neg Hx    Arthritis Neg Hx    Asthma Neg Hx    Birth defects Neg Hx    COPD Neg Hx    Depression Neg Hx    Diabetes Neg Hx    Early death Neg Hx    Drug abuse Neg Hx    Hearing loss Neg Hx    Heart disease Neg Hx    Hyperlipidemia Neg Hx    Hypertension Neg Hx    Kidney disease Neg Hx    Learning disabilities Neg Hx    Mental illness Neg Hx    Mental retardation Neg Hx    Miscarriages /  Stillbirths Neg Hx    Stroke Neg Hx    Vision loss Neg Hx     Allergies: No Known Allergies  Medications Prior to Admission  Medication Sig Dispense Refill Last Dose   cyclobenzaprine (FLEXERIL) 10 MG tablet Take 1 tablet (10 mg total) by mouth every 8 (eight) hours as needed for muscle spasms. 30 tablet 1 07/04/2022   Prenatal MV & Min w/FA-DHA (PRENATAL GUMMIES PO) Take by mouth.   07/05/2022   valACYclovir (VALTREX) 500 MG tablet Take 1 tablet by mouth twice daily 60 tablet 0 Past Month   Blood Pressure Monitoring (BLOOD PRESSURE KIT) DEVI 1 kit by Does not apply route once a week. Check Blood Pressure regularly and record readings into the Babyscripts App.  Large Cuff.  DX O90.0 (Patient not taking: Reported on 05/28/2022) 1 each 0    metroNIDAZOLE (FLAGYL) 500 MG tablet Take 1 tablet (500 mg total) by mouth 2 (two) times daily. (Patient not taking: Reported on 07/02/2022) 14 tablet 0      Review of Systems   All systems reviewed and negative except as stated in HPI  Blood pressure (!) 150/72, pulse 93, temperature 98.5 F (36.9 C), temperature source Oral, resp. rate 18, height 6' (1.829 m), weight 93.8 kg, last menstrual period 10/06/2021, SpO2 98 %, unknown if currently breastfeeding. General appearance: alert and no distress Lungs: clear to auscultation bilaterally Heart: regular rate and rhythm Abdomen: soft, non-tender; bowel sounds normal Pelvic: no evidence of herpes lesions  Extremities: Homans sign is negative, no sign of DVT Presentation: cephalic Fetal monitoringBaseline: 125 bpm, Variability: Good {> 6 bpm), Accelerations: Reactive, and Decelerations: Absent Uterine activityDate/time of onset: 1600, Frequency: Every few minutes, Duration: 30-60 seconds, and Intensity: moderate Dilation: 5 Effacement (%): 80 Station: -2 Exam by:: Jeronimo Greaves, CNM   Prenatal labs: ABO, Rh: --/--/O POS (08/27 1755) Antibody: NEG (08/27 1755) Rubella: 1.15 (05/04 1128) RPR: Non  Reactive (06/21 1154)  HBsAg: Negative (05/04 1128)  HIV: Non Reactive (06/21 1154)  GBS: Negative/-- (08/16 1429)  1 hr Glucola 105 Genetic  screening  normal Anatomy US normal  Prenatal Transfer Tool  Maternal Diabetes: No Genetic Screening: Normal Maternal Ultrasounds/Referrals: Normal Fetal Ultrasounds or other Referrals:  None Maternal Substance Abuse:  No Significant Maternal Medications:  None Significant Maternal Lab Results: None  Results for orders placed or performed during the hospital encounter of 07/05/22 (from the past 24 hour(s))  CBC   Collection Time: 07/05/22  5:38 PM  Result Value Ref Range   WBC 16.8 (H) 4.0 - 10.5 K/uL   RBC 3.75 (L) 3.87 - 5.11 MIL/uL   Hemoglobin 10.2 (L) 12.0 - 15.0 g/dL   HCT 31.0 (L) 36.0 - 46.0 %   MCV 82.7 80.0 - 100.0 fL   MCH 27.2 26.0 - 34.0 pg   MCHC 32.9 30.0 - 36.0 g/dL   RDW 14.7 11.5 - 15.5 %   Platelets 283 150 - 400 K/uL   nRBC 0.0 0.0 - 0.2 %  Type and screen [place]   Collection Time: 07/05/22  5:55 PM  Result Value Ref Range   ABO/RH(D) O POS    Antibody Screen NEG    Sample Expiration      07/08/2022,2359 Performed at [facility], [place] [address], [place], [place] [address]   Maryann Alar Test   Collection Time: 07/05/22  6:00 PM  Result Value Ref Range   POCT Fern Test Negative = intact amniotic membranes     Patient Active Problem List   Diagnosis Date Noted   Abnormal Pap smear of cervix 03/28/2022   Supervision of other normal pregnancy, antepartum 03/11/2022   ASCUS with positive high risk HPV cervical 11/20/2020   History of herpes genitalis 11/11/2020   Bartholin cyst 04/26/2020   Oral herpes simplex, 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   Esophageal reflux 02/08/2013    Assessment/Plan:  Carrie Nielsen is a 31 y.o. [id number] at 12w6dhere for contractions and leakage of fluid.  #Labor:Expectant management #Pain: Epidural placed #FWB: Cat 1 #ID:  GBS neg #MOF: Bottle #MOC:  IUD #Circ:  Yes  AElane Nielsen Medical Student  07/05/2022, 7:00 PM  I was present for the exam and agree with above. Perineum inspected. No lesions. Pt reports that she has only has HVS on her chest. Last outbreak at beginning of 2023.   STamala Julian VVermont CNorth Dakota8/27/2023 7:37 PM

## 2022-07-05 NOTE — Procedures (Signed)
Post-Placental IUD Insertion Procedure Note  Patient identified, informed consent signed prior to delivery, signed copy in chart, time out was performed.    Vaginal, labial and perineal areas thoroughly inspected for lacerations. There were no lacerations requiring repair at time of placement.   Mirena  - IUD grasped between sterile gloved fingers. Fundus identified through abdominal wall using non-insertion hand. IUD inserted to fundus with bimanual technique. IUD carefully released at the fundus and insertion hand gently removed from vagina.    Strings trimmed to the level of the introitus. Patient tolerated procedure well.  Lot # SEE MAR Expiration Date SEE MAR  Patient given post procedure instructions and IUD care card with expiration date.  Patient is asked to keep IUD strings tucked in her vagina until her postpartum follow up visit in 4-6 weeks. Patient advised to monitor for expulsion. Patient verbalized an understanding of the plan of care and agrees.   Lavonda Jumbo, DO OB Fellow, Faculty [facility], Center for [facility] 07/05/2022, 11:50 PM

## 2022-07-05 NOTE — Progress Notes (Addendum)
Labor Progress Note Carrie Nielsen is a 31 y.o. [id number] at [redacted]w[redacted]d presented for SOL.    S: Carrie Nielsen is doing well. Pain is well controlled with epidural. She is feeling occasional pressure. Membranes still intact.   O:  BP 112/66   Pulse 71   Temp 98.5 F (36.9 C) (Oral)   Resp 16   Ht 6' (1.829 m)   Wt 93.8 kg   LMP 10/06/2021 (Approximate)   SpO2 99%   BMI 28.03 kg/m  EFM: 130bpm/moderate variability/no acels or decels. Poor EFM due to mom positioning. Nurse came into adjust monitor while in the room.   CVE: Dilation: 8 Effacement (%): 70 Cervical Position: Posterior Station: -2 Presentation: Vertex Exam by:: Boone Master, RN   A&P: 31 y.o. [phone] [redacted]w[redacted]d who presented for IOL due to SOL. #Labor: Progressing well. Not currently augmenting, patient continues to progress. Discussed amniotomy. Patient does not wish for this at this time. Discussed possibility if had no progress at next check #Pain: Epidural in place #FWB: Cat I tracing #GBS negative   Colter Burgess Estelle, MD Resident Physician 9:13 PM   GME ATTESTATION:  I saw and evaluated the patient. I agree with the findings and the plan of care as documented in the resident's note. I have made changes to documentation as necessary.  Lavonda Jumbo, DO OB Fellow, Faculty [facility], Center for [facility] 07/05/2022, 10:14 PM

## 2022-07-05 NOTE — Progress Notes (Addendum)
Labor Progress Note Carrie Nielsen is a 31 y.o. [id number] at [redacted]w[redacted]d presented for SOL.   S: Carrie Nielsen is doing well. Pain remains well controlled. Amniotomy discussed and patient in agreement.   O:  BP 134/87   Pulse 79   Temp 98.5 F (36.9 C) (Oral)   Resp 16   Ht 6' (1.829 m)   Wt 93.8 kg   LMP 10/06/2021 (Approximate)   SpO2 99%   BMI 28.03 kg/m  EFM: 130bpm/moderate variability/(+) accels, no decelerations  CVE: Dilation: 10 Dilation Complete Date: 07/05/22 Dilation Complete Time: 2307 Effacement (%): 100 Cervical Position: Posterior Station: -2 Presentation: Vertex Exam by:: Dr. Salvadore Dom   A&P: 31 y.o. [id number] [redacted]w[redacted]d  who presented for IOL due to SOL #Labor: Progressing well. Amniotomy performed. Clear fluid. Complete. Will begin pushing as soon as patient feels pressure.  #Pain: Epidural #FWB: Cat I #GBS negative   Colter Burgess Estelle, MD Resident Physician  11:16 PM  GME ATTESTATION:  I saw and evaluated the patient. I agree with the findings and the plan of care as documented in the resident's note. I have made changes to documentation as necessary.  Lavonda Jumbo, DO OB Fellow, Faculty [facility], Center for [facility] 07/05/2022, 11:55 PM

## 2022-07-05 NOTE — Anesthesia Procedure Notes (Signed)
Epidural Patient location during procedure: OB Start time: 07/05/2022 6:45 PM End time: 07/05/2022 6:48 PM  Staffing Anesthesiologist: Bethena Midget, MD  Preanesthetic Checklist Completed: patient identified, IV checked, site marked, risks and benefits discussed, surgical consent, monitors and equipment checked, pre-op evaluation and timeout performed  Epidural Patient position: sitting Prep: DuraPrep and site prepped and draped Patient monitoring: continuous pulse ox and blood pressure Approach: midline Location: L3-L4 Injection technique: LOR air  Needle:  Needle type: Tuohy  Needle gauge: 17 G Needle length: 9 cm and 9 Needle insertion depth: 7 cm Catheter type: closed end flexible Catheter size: 19 Gauge Catheter at skin depth: 13 cm Test dose: negative  Assessment Events: blood not aspirated, injection not painful, no injection resistance, no paresthesia and negative IV test

## 2022-07-06 ENCOUNTER — Encounter (HOSPITAL_COMMUNITY): Payer: Self-pay | Admitting: Obstetrics & Gynecology

## 2022-07-06 LAB — RPR: RPR Ser Ql: NONREACTIVE

## 2022-07-06 MED ORDER — SIMETHICONE 80 MG PO CHEW: 80.0000 mg | CHEWABLE_TABLET | ORAL | Status: DC | PRN

## 2022-07-06 MED ORDER — TETANUS-DIPHTH-ACELL PERTUSSIS 5-2.5-18.5 LF-MCG/0.5 IM SUSY: 0.5000 mL | PREFILLED_SYRINGE | Freq: Once | INTRAMUSCULAR | Status: DC

## 2022-07-06 MED ORDER — BENZOCAINE-MENTHOL 20-0.5 % EX AERO
1.0000 | INHALATION_SPRAY | CUTANEOUS | Status: DC | PRN
  Filled 2022-07-06: qty 56

## 2022-07-06 MED ORDER — IBUPROFEN 600 MG PO TABS
600.0000 mg | ORAL_TABLET | Freq: Four times a day (QID) | ORAL | Status: DC
  Administered 2022-07-06 – 2022-07-07 (×6): 600 mg via ORAL
  Filled 2022-07-06 (×6): qty 1

## 2022-07-06 MED ORDER — WITCH HAZEL-GLYCERIN EX PADS: 1.0000 | MEDICATED_PAD | CUTANEOUS | Status: DC | PRN

## 2022-07-06 MED ORDER — DIPHENHYDRAMINE HCL 25 MG PO CAPS: 25.0000 mg | ORAL_CAPSULE | Freq: Four times a day (QID) | ORAL | Status: DC | PRN

## 2022-07-06 MED ORDER — SENNOSIDES-DOCUSATE SODIUM 8.6-50 MG PO TABS
2.0000 | ORAL_TABLET | Freq: Every day | ORAL | Status: DC
  Administered 2022-07-06 – 2022-07-07 (×2): 2 via ORAL
  Filled 2022-07-06 (×2): qty 2

## 2022-07-06 MED ORDER — ONDANSETRON HCL 4 MG/2ML IJ SOLN: 4.0000 mg | INTRAMUSCULAR | Status: DC | PRN

## 2022-07-06 MED ORDER — OXYCODONE HCL 5 MG PO TABS
5.0000 mg | ORAL_TABLET | Freq: Four times a day (QID) | ORAL | Status: DC | PRN
  Administered 2022-07-06 – 2022-07-07 (×3): 5 mg via ORAL
  Filled 2022-07-06 (×3): qty 1

## 2022-07-06 MED ORDER — ONDANSETRON HCL 4 MG PO TABS: 4.0000 mg | ORAL_TABLET | ORAL | Status: DC | PRN

## 2022-07-06 MED ORDER — COCONUT OIL OIL: 1.0000 | TOPICAL_OIL | Status: DC | PRN

## 2022-07-06 MED ORDER — ACETAMINOPHEN 325 MG PO TABS
650.0000 mg | ORAL_TABLET | ORAL | Status: DC | PRN
  Administered 2022-07-06 – 2022-07-07 (×4): 650 mg via ORAL
  Filled 2022-07-06 (×4): qty 2

## 2022-07-06 MED ORDER — DIBUCAINE (PERIANAL) 1 % EX OINT: 1.0000 | TOPICAL_OINTMENT | CUTANEOUS | Status: DC | PRN

## 2022-07-06 MED ORDER — ZOLPIDEM TARTRATE 5 MG PO TABS: 5.0000 mg | ORAL_TABLET | Freq: Every evening | ORAL | Status: DC | PRN

## 2022-07-06 MED ORDER — PRENATAL MULTIVITAMIN CH
1.0000 | ORAL_TABLET | Freq: Every day | ORAL | Status: DC
  Administered 2022-07-06 – 2022-07-07 (×2): 1 via ORAL
  Filled 2022-07-06 (×2): qty 1

## 2022-07-06 NOTE — Lactation Note (Signed)
This note was copied from a baby's chart. Lactation Consultation Note  Patient Name: Carrie Nielsen NGBMB'O Date: 07/06/2022   Age:29 hours Baby is being formula fed.  Maternal Data    Feeding Nipple Type: Slow - flow  LATCH Score                    Lactation Tools Discussed/Used    Interventions    Discharge    Consult Status Consult Status: Complete    Jarvis Sawa G 07/06/2022, 2:21 AM

## 2022-07-06 NOTE — Progress Notes (Signed)
Dr. Miquel Dunn notified of patient being in a lot of pain all of a sudden and that she passed a clot in the bathroom. Patient states she is not dizzy. Patient had some trickling noted. Dr. Miquel Dunn ordered more pain meds and stated to do a bladder scan to make sure she emptied out and to call her if its over 300cc.

## 2022-07-06 NOTE — Social Work (Signed)
CSW acknowledged consult and completed a clinical assessment.  There are no barriers to d/c.  Clinical assessment notes will be entered at a later time.  [name], LCSWA Clinical Social Worker [phone]  

## 2022-07-06 NOTE — Progress Notes (Signed)
POSTPARTUM PROGRESS NOTE  Post Partum Day #1  Subjective:  Carrie Nielsen is a 31 y.o. [id number] s/p SVD at [redacted]w[redacted]d.  She reports she is doing well. No acute events overnight. She denies any problems with ambulating, voiding or po intake. Denies nausea or vomiting.  Pain is well controlled.  Lochia is appropriate.  Objective: Blood pressure 117/70, pulse 83, temperature 98.2 F (36.8 C), temperature source Oral, resp. rate 18, height 6' (1.829 m), weight 93.8 kg, last menstrual period 10/06/2021, SpO2 99 %, unknown if currently breastfeeding.  Physical Exam:  General: alert, cooperative and no distress Chest: no respiratory distress Heart:regular rate, distal pulses intact Abdomen: soft, nontender,  Uterine Fundus: firm, appropriately tender DVT Evaluation: No calf swelling or tenderness Extremities: No edema Skin: warm, dry  Recent Labs    07/05/22 1738  HGB 10.2*  HCT 31.0*    Assessment/Plan: Carrie Nielsen is a 31 y.o. [id number] s/p SVD at [redacted]w[redacted]d   PPD#1 - Doing well Routine postpartum care Contraception: ppIUD placed Feeding: bottle Dispo: Plan for discharge likely 07/07/22.   LOS: 1 day   Moody Bruins, MD Resident Physician 07/06/2022, 6:12 AM

## 2022-07-06 NOTE — Anesthesia Postprocedure Evaluation (Signed)
Anesthesia Post Note  Patient: [facility]  Procedure(s) Performed: AN AD HOC LABOR EPIDURAL     Patient location during evaluation: Mother Baby Anesthesia Type: Epidural Level of consciousness: awake and alert and oriented Pain management: satisfactory to patient Vital Signs Assessment: post-procedure vital signs reviewed and stable Respiratory status: respiratory function stable Cardiovascular status: stable Postop Assessment: no headache, no backache, epidural receding, patient able to bend at knees, no signs of nausea or vomiting, adequate PO intake and able to ambulate Anesthetic complications: no   No notable events documented.  Last Vitals:  Vitals:   07/06/22 0920 07/06/22 1300  BP: 114/74 119/72  Pulse: 74 65  Resp: 18   Temp: 36.7 C   SpO2: 99%     Last Pain:  Vitals:   07/06/22 1246  TempSrc:   PainSc: 5    Pain Goal:                   Elige Shouse

## 2022-07-06 NOTE — Progress Notes (Signed)
At bedside after RN call of trickle of blood and patient noting a small clot in toilet and increased pain. At bedside, pt is in bathroom. Initial bladder scan per RN was 380 cc and patient felt like she could attempt to void again.  Examined patient at bedside after she used bathroom.  After urinating, PVR was 5 cc. Uterine fundus firm and 2 cm below umbilicus, per RN was higher and firm earlier. No trickle of bleeding or clots noted with fundal massage. Patient is s/p 1 dose of oxycodone and feels improved. No edema of legs bilaterally.   She denies any dizziness, lightheadedness, and feels pain is improved.  Will continue monitor closely, instructed patient to attempt voiding every 3-4 hours and if she has recurrence of pain discussed to repeat bladder scan with RN.  Burley Saver MD

## 2022-07-07 NOTE — Clinical Social Work Maternal (Signed)
CLINICAL SOCIAL WORK MATERNAL/CHILD NOTE  Patient Details  Name: Carrie Nielsen MRN: [id number] Date of Birth: 05/18/1991  Date:  07/07/2022  Clinical Social Worker Initiating Note:  Monia Pouch, LCSWA Date/Time: Initiated:  07/06/22/1629     Child's Name:  Carrie Nielsen   Biological Parents:  Mother, Father Paulyne Mooty [birth year] 1993, Scot Jun)   Need for Interpreter:  None   Reason for Referral:  Late or No Prenatal Care     Address:  [place] [address]    Phone number:  [phone] (home)     Additional phone number:   Household Members/Support Persons (HM/SP):   Household Member/Support Person 1, Household Member/Support Person 2, Household Member/Support Person 3, Household Member/Support Person 4   HM/SP Name Relationship DOB or Age  HM/SP -1 Zemira Zehring 12/04/12  HM/SP -2 Marge Duncans Samarin son 02/11/16  HM/SP -3 Aiden Woollard son 12/04/20  HM/SP -[place] grandmother    HM/SP -5        HM/SP -6        HM/SP -7        HM/SP -8          Natural Supports (not living in the home):      Professional Supports:     Employment: Animator   Type of Work: Public librarian   Education:  [employer] school graduate   Homebound arranged:    Museum/gallery curator Resources:  [employer]   Other Resources:  [employer], Physicist, medical     Cultural/Religious Considerations Which May Impact Care:    Strengths:  Ability to meet basic needs     Psychotropic Medications:         Pediatrician:       Pediatrician List:   [place]      Pediatrician Fax Number:    Risk Factors/Current Problems:      Cognitive State:  Alert  , Able to Concentrate     Mood/Affect:  Calm  , Comfortable     CSW Assessment: CSW received consult for hx limited prenatal care. CSW met with MOB to offer support and complete assessment. CSW entered the room, introduced self, CSW role and  reason for visit. MOB was agreeable to visit. CSW observed MOB holding the infant while up in bed. CSW inquired about how MOB was doing. MOB reported she was doing well and recovery has been smooth. CSW congratulated MOB on baby "Eulas Post". CSW inquired about any MH concerns MOB denied ay MH diagnosis. CSW inquired about medications or Therapy, MOB reported none. CSW assessed for safety, MOB denied any SI, HI or DV. CSW provided education regarding the baby blues period vs. perinatal mood disorders, discussed treatment and gave resources for mental health follow up if concerns arise.  CSW recommends self-evaluation during the postpartum time period using the New Mom Checklist from Postpartum Progress and encouraged MOB to contact a medical professional if symptoms are noted at any time. MOB reported FOB, her mom and grandmother are her supports.   CSW inquired about MOB's limited prenatal care. MOB reported she did not find out until she was 5 months. MOB reported when she found out she did not have insurance so she was waiting until her insurance started to begin receiving care. MOB reported no transportation barriers. CSW informed MOB of the [place] when there is less  than 4 prenatal visits. MOB voiced understanding. Infants UDS was negative.   CSW provided review of Sudden Infant Death Syndrome (SIDS) precautions. MOB reported she needs a car seat for the infant and can provide the $30 dollars for it. CSW will provide upon discharge.     CSW Plan/Description:  CSW Will Continue to Monitor Umbilical Cord Tissue Drug Screen Results and Make Report if Warranted, Sudden Infant Death Syndrome (SIDS) Education, Perinatal Mood and Anxiety Disorder (PMADs) Education, [place], [place] 07/07/2022, 8:57 AM

## 2022-07-07 NOTE — Progress Notes (Signed)
Circumcision Consent  Discussed with mom at bedside about circumcision.   Circumcision is a surgery that removes the skin that covers the tip of the penis, called the "foreskin." Circumcision is usually done when a boy is between 1 and 10 days old, sometimes up to 3-4 weeks old.  The most common reasons boys are circumcised include for cultural/religious beliefs or for parental preference (potentially easier to clean, so baby looks like daddy, etc).  There may be some medical benefits for circumcision:   Circumcised boys seem to have slightly lower rates of: ? Urinary tract infections (per the American Academy of Pediatrics an uncircumcised boy has a 1/100 chance of developing a UTI in the first year of life, a circumcised boy at a 11/998 chance of developing a UTI in the first year of life- a 10% reduction) ? Penis cancer (typically rare- an uncircumcised female has a 1 in 100,000 chance of developing cancer of the penis) ? Sexually transmitted infection (in endemic areas, including HIV, HPV and Herpes- circumcision does NOT protect against gonorrhea, chlamydia, trachomatis, or syphilis) ? Phimosis: a condition where that makes retraction of the foreskin over the glans impossible (0.4 per 1000 boys per year or 0.6% of boys are affected by their 15th birthday)  Boys and men who are not circumcised can reduce these extra risks by: ? Cleaning their penis well ? Using condoms during sex  What are the risks of circumcision?  As with any surgical procedure, there are risks and complications. In circumcision, complications are rare and usually minor, the most common being: ? Bleeding- risk is reduced by holding each clamp for 30 seconds prior to a cut being made, and by holding pressure after the procedure is done ? Infection- the penis is cleaned prior to the procedure, and the procedure is done under sterile technique ? Damage to the urethra or amputation of the penis  How is circumcision done  in baby boys?  The baby will be placed on a special table and the legs restrained for their safety. Numbing medication is injected into the penis, and the skin is cleansed with betadine to decrease the risk of infection.   What to expect:  The penis will look red and raw for 5-7 days as it heals. We expect scabbing around where the cut was made, as well as clear-pink fluid and some swelling of the penis right after the procedure. If your baby's circumcision starts to bleed or develops pus, please contact your pediatrician immediately.  All questions were answered and mother consented.  Marnisha Stampley V Allannah Kempen, MD 6:47 AM  

## 2022-07-09 ENCOUNTER — Encounter: Payer: Medicaid Other | Admitting: Obstetrics and Gynecology

## 2022-07-10 ENCOUNTER — Telehealth: Payer: Self-pay

## 2022-07-10 ENCOUNTER — Telehealth: Payer: Self-pay | Admitting: Emergency Medicine

## 2022-07-10 ENCOUNTER — Ambulatory Visit (INDEPENDENT_AMBULATORY_CARE_PROVIDER_SITE_OTHER): Payer: Medicaid Other | Admitting: Emergency Medicine

## 2022-07-10 VITALS — BP 125/82 | HR 85 | Wt 193.0 lb

## 2022-07-10 DIAGNOSIS — R300 Dysuria: Secondary | ICD-10-CM | POA: Diagnosis not present

## 2022-07-10 LAB — POCT URINALYSIS DIPSTICK
Glucose, UA: NEGATIVE
Ketones, UA: NEGATIVE
Nitrite, UA: NEGATIVE
Protein, UA: POSITIVE — AB
Spec Grav, UA: 1.015 (ref 1.010–1.025)
Urobilinogen, UA: 0.2 E.U./dL
pH, UA: 6.5 (ref 5.0–8.0)

## 2022-07-10 MED ORDER — NITROFURANTOIN MONOHYD MACRO 100 MG PO CAPS: 100.0000 mg | ORAL_CAPSULE | Freq: Two times a day (BID) | ORAL | 0 refills | Status: DC

## 2022-07-10 MED ORDER — PHENAZOPYRIDINE HCL 200 MG PO TABS: 200.0000 mg | ORAL_TABLET | Freq: Three times a day (TID) | ORAL | 1 refills | Status: DC | PRN

## 2022-07-10 NOTE — Progress Notes (Signed)
SUBJECTIVE: Carrie Nielsen is a 31 y.o. female who complains of urinary frequency, urgency and dysuria x 5 days, with flank pain, chills. Denies abnormal vaginal discharge or bleeding.   OBJECTIVE: Appears well, in no apparent distress.  Vital signs are normal. Urine dipstick shows positive for WBC's, positive for protein, and positive for urobilinogen.    ASSESSMENT: Dysuria  PLAN: Treatment per orders.  Call or return to clinic prn if these symptoms worsen or fail to improve as anticipated.  Urine culture sent Rx for UTI sent to pharmacy, per protocol

## 2022-07-10 NOTE — Telephone Encounter (Cosign Needed)
TC to patient to reinforce precautions to present to hospital for evaluation should symptoms persist or worsen.  -Patient reports occasional chills that last about 10 mins and resolve, but denies fever at home. Reports low back pain near epidural site, but denies flank pain. Reports pressure on lower abdomen when urination.  -Seen in office today for nurse visit, urine dipped and culture sent to lab. Treated per protocol for UTI with Macrobid.

## 2022-07-10 NOTE — Telephone Encounter (Signed)
Returned call, pt reports discomfort with urination and frequency. Pt will come in to leave urine sample today.

## 2022-07-12 LAB — URINE CULTURE

## 2022-07-14 ENCOUNTER — Telehealth (HOSPITAL_COMMUNITY): Payer: Self-pay | Admitting: *Deleted

## 2022-07-14 NOTE — Telephone Encounter (Signed)
Number is not in service.  Duffy Rhody, RN 07-14-2022 at 9:51am

## 2022-08-19 ENCOUNTER — Ambulatory Visit: Payer: Medicaid Other | Admitting: Obstetrics and Gynecology

## 2022-09-23 ENCOUNTER — Ambulatory Visit (INDEPENDENT_AMBULATORY_CARE_PROVIDER_SITE_OTHER): Payer: Medicaid Other | Admitting: Obstetrics

## 2022-09-23 ENCOUNTER — Encounter: Payer: Self-pay | Admitting: Obstetrics

## 2022-09-23 DIAGNOSIS — Z30011 Encounter for initial prescription of contraceptive pills: Secondary | ICD-10-CM

## 2022-09-23 DIAGNOSIS — Z30431 Encounter for routine checking of intrauterine contraceptive device: Secondary | ICD-10-CM | POA: Diagnosis not present

## 2022-09-23 DIAGNOSIS — Z3009 Encounter for other general counseling and advice on contraception: Secondary | ICD-10-CM

## 2022-09-23 MED ORDER — ORTHO-NOVUM 1/35 (28) 1-35 MG-MCG PO TABS: 1.0000 | ORAL_TABLET | Freq: Every day | ORAL | 11 refills | Status: DC

## 2022-09-23 NOTE — Progress Notes (Signed)
   TELEHEALTH GYNECOLOGY VISIT ENCOUNTER NOTE  Provider location: Center for [facility]   Patient location: Home  I connected with [facility] on 09/23/22 at  3:30 PM EST by telephone and verified that I am speaking with the correct person using two identifiers. Patient was unable to do MyChart audiovisual encounter due to technical difficulties, she tried several times.    I discussed the limitations, risks, security and privacy concerns of performing an evaluation and management service by telephone and the availability of in person appointments. I also discussed with the patient that there may be a patient responsible charge related to this service. The patient expressed understanding and agreed to proceed.   History:  Carrie Nielsen is a 31 y.o. [phone] female being evaluated today for contraception.  Her IUD fell out.. She denies any abnormal vaginal discharge, bleeding, pelvic pain or other concerns.       Past Medical History:  Diagnosis Date   Bartholin cyst 06/19/2021   History of cold sores 06/19/2021   Pregnancy induced hypertension    Vaginal Pap smear, abnormal 11/08/2020   Past Surgical History:  Procedure Laterality Date   BARTHOLIN GLAND CYST EXCISION N/A 06/24/2021   Procedure: BARTHOLIN GLAND EXCISION;  Surgeon: Hermina Staggers, MD;  Location: [facility];  Service: Gynecology;  Laterality: N/A;   IUD INSERTION  07/05/2022   WISDOM TOOTH EXTRACTION     yrs ago   The following portions of the patient's history were reviewed and updated as appropriate: allergies, current medications, past family history, past medical history, past social history, past surgical history and problem list.   Health Maintenance:  Abnormal pap and positive HRHPV on 03-12-2022.    Review of Systems:  Pertinent items noted in HPI and remainder of comprehensive ROS otherwise negative.  Physical Exam:   General:  Alert, oriented and cooperative.   Mental  Status: Normal mood and affect perceived. Normal judgment and thought content.  Physical exam deferred due to nature of the encounter  Labs and Imaging No results found for this or any previous visit (from the past 336 hour(s)). No results found.    Assessment and Plan:     .  1. Encounter for other general counseling and advice on contraception  2. Surveillance of intrauterine contraception - IUD fell out.  Wants to switch to the pill  3. Encounter for initial prescription of contraceptive pills Rx: - norethindrone-ethinyl estradiol 1/35 (ORTHO-NOVUM 1/35, 28,) tablet; Take 1 tablet by mouth daily.  Dispense: 28 tablet; Refill: 11   4. HGSI\ - needs Colposcopy    I discussed the assessment and treatment plan with the patient. The patient was provided an opportunity to ask questions and all were answered. The patient agreed with the plan and demonstrated an understanding of the instructions.   The patient was advised to call back or seek an in-person evaluation/go to the ED if the symptoms worsen or if the condition fails to improve as anticipated.  I have spent a total of 10 minutes of non-face-to-face time, excluding clinical staff time, reviewing notes and preparing to see patient, ordering tests and/or medications, and counseling the patient.    Coral Ceo, MD Center for [facility], [facility], [place], [place] 09/23/22

## 2022-09-23 NOTE — Progress Notes (Deleted)
31 y.o GYN presents for [facility] Consult.

## 2022-09-30 ENCOUNTER — Ambulatory Visit: Payer: Medicaid Other | Admitting: Obstetrics

## 2022-10-07 ENCOUNTER — Telehealth: Payer: Self-pay | Admitting: Emergency Medicine

## 2022-10-07 NOTE — Telephone Encounter (Signed)
TC from pt to front desk, stating that she is awaiting Dr. Clearance Coots to place order for Valtrex. Will route to Dr. Clearance Coots for order request.

## 2022-10-08 ENCOUNTER — Other Ambulatory Visit: Payer: Self-pay | Admitting: Obstetrics and Gynecology

## 2022-10-08 DIAGNOSIS — A6000 Herpesviral infection of urogenital system, unspecified: Secondary | ICD-10-CM

## 2022-10-08 MED ORDER — VALACYCLOVIR HCL 1 G PO TABS: 1000.0000 mg | ORAL_TABLET | Freq: Two times a day (BID) | ORAL | 99 refills | Status: DC | PRN

## 2022-10-13 ENCOUNTER — Other Ambulatory Visit: Payer: Self-pay | Admitting: Obstetrics

## 2022-10-13 DIAGNOSIS — A6000 Herpesviral infection of urogenital system, unspecified: Secondary | ICD-10-CM

## 2022-10-13 MED ORDER — VALACYCLOVIR HCL 1 G PO TABS: 1000.0000 mg | ORAL_TABLET | Freq: Two times a day (BID) | ORAL | 5 refills | Status: DC | PRN

## 2023-02-24 ENCOUNTER — Ambulatory Visit: Payer: Medicaid Other | Admitting: Obstetrics

## 2023-03-01 ENCOUNTER — Ambulatory Visit: Payer: Medicaid Other | Admitting: Obstetrics

## 2023-07-14 ENCOUNTER — Ambulatory Visit
Admission: EM | Admit: 2023-07-14 | Discharge: 2023-07-14 | Disposition: A | Discharge status: Home / Self Care | Payer: Medicaid Other | Attending: Physician Assistant | Admitting: Physician Assistant

## 2023-07-14 DIAGNOSIS — L309 Dermatitis, unspecified: Secondary | ICD-10-CM | POA: Diagnosis not present

## 2023-07-14 MED ORDER — TRIAMCINOLONE ACETONIDE 0.1 % EX CREA: 1.0000 | TOPICAL_CREAM | Freq: Two times a day (BID) | CUTANEOUS | 0 refills | Status: DC

## 2023-07-14 NOTE — ED Provider Notes (Signed)
[facility]    CSN: [id number] Arrival date & time: 07/14/23  1600      History   Chief Complaint Chief Complaint  Patient presents with   Rash    Right Hand. Family of 3    HPI Carrie Nielsen is a 32 y.o. female.   Patient here today for evaluation of a rash to her right hand that will not go away.  She states that she has itching in the area.  She has not tried any topical treatment for same.  She denies any swelling.  She has not had any fever.  The history is provided by the patient.  Rash Associated symptoms: no abdominal pain, no fever, no nausea, no shortness of breath and not vomiting     Past Medical History:  Diagnosis Date   Bartholin cyst 06/19/2021   History of cold sores 06/19/2021   Pregnancy induced hypertension    Vaginal Pap smear, abnormal 11/08/2020    Patient Active Problem List   Diagnosis Date Noted   IUD (intrauterine device) in place 07/05/2022   Unwanted fertility    Abnormal Pap smear of cervix 03/28/2022   Supervision of other normal pregnancy, antepartum 03/11/2022   ASCUS with positive high risk HPV cervical 11/20/2020   History of herpes genitalis 11/11/2020   Bartholin cyst 04/26/2020   Oral herpes simplex, 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;  Service: Gynecology;  Laterality: N/A;   IUD INSERTION  07/05/2022   WISDOM TOOTH EXTRACTION     yrs ago    OB History     Gravida  6   Para  4   Term  4   Preterm      AB  2   Living  4      SAB  1   IAB  1   Ectopic      Multiple  0   Live Births  4            Home Medications    Prior to Admission medications   Medication Sig Start Date End Date Taking? Authorizing Provider  triamcinolone cream (KENALOG) 0.1  % Apply 1 Application topically 2 (two) times daily. 07/14/23  Yes Tomi Bamberger, PA-C  valACYclovir (VALTREX) 1000 MG tablet Take 1 tablet (1,000 mg total) by mouth 2 (two) times daily as needed (for outbreaks). Take for 3 days prn each outbreak. 10/13/22  Yes Brock Bad, MD  acetaminophen (TYLENOL) 500 MG tablet Take 1,000 mg by mouth 2 (two) times daily as needed (pain).    [provider]  Blood Pressure Monitoring (BLOOD PRESSURE KIT) DEVI 1 kit by Does not apply route once a week. Check Blood Pressure regularly and record readings into the Babys[employer] App.  Large Cuff.  DX O90.0 Patient not taking: Reported on 05/28/2022 03/12/22   Rasch, Victorino Dike I, NP  Calcium Carbonate Antacid (TUMS CHEWY BITES PO) Take 1 tablet by mouth 3 (three) times daily as needed (heartburn).    [provider]  nitrofurantoin, macrocrystal-monohydrate, (MACROBID) 100 MG capsule Take 1 capsule (100 mg total) by mouth 2 (two) times daily. 07/10/22   Warden Fillers, MD  norethindrone-ethinyl estradiol 1/35 (ORTHO-NOVUM 1/35, 28,) tablet Take 1 tablet by mouth daily. 09/23/22  Brock Bad, MD  phenazopyridine (PYRIDIUM) 200 MG tablet Take 1 tablet (200 mg total) by mouth 3 (three) times daily as needed for pain (urethral spasm). 07/10/22   Warden Fillers, MD  Prenatal MV & Min w/FA-DHA (PRENATAL GUMMIES PO) Take 1 tablet by mouth daily in the afternoon.    [provider]    Family History Family History  Problem Relation Age of Onset   Cancer Maternal Grandfather        brain   Alcohol abuse Neg Hx    Arthritis Neg Hx    Asthma Neg Hx    Birth defects Neg Hx    COPD Neg Hx    Depression Neg Hx    Diabetes Neg Hx    Early death Neg Hx    Drug abuse Neg Hx    Hearing loss Neg Hx    Heart disease Neg Hx    Hyperlipidemia Neg Hx    Hypertension Neg Hx    Kidney disease Neg Hx    Learning disabilities Neg Hx    Mental illness Neg Hx    Mental retardation Neg Hx     Miscarriages / Stillbirths Neg Hx    Stroke Neg Hx    Vision loss Neg Hx     Social History Social History   Tobacco Use   Smoking status: Never   Smokeless tobacco: Never  Vaping Use   Vaping status: Never Used  Substance Use Topics   Alcohol use: Not Currently   Drug use: Not Currently     Allergies   Patient has no known allergies.   Review of Systems Review of Systems  Constitutional:  Negative for chills and fever.  Eyes:  Negative for discharge and redness.  Respiratory:  Negative for shortness of breath.   Gastrointestinal:  Negative for abdominal pain, nausea and vomiting.  Skin:  Positive for rash.     Physical Exam Triage Vital Signs ED Triage Vitals  Encounter Vitals Group     BP 07/14/23 1617 109/73     Systolic BP Percentile --      Diastolic BP Percentile --      Pulse Rate 07/14/23 1617 70     Resp 07/14/23 1617 18     Temp 07/14/23 1617 98.1 F (36.7 C)     Temp Source 07/14/23 1617 Oral     SpO2 07/14/23 1617 96 %     Weight 07/14/23 1616 150 lb (68 kg)     Height 07/14/23 1616 5\' 10"  (1.778 m)     Head Circumference --      Peak Flow --      Pain Score 07/14/23 1604 0     Pain Loc --      Pain Education --      Exclude from Growth Chart --    No data found.  Updated Vital Signs BP 109/73 (BP Location: Left Arm)   Pulse 70   Temp 98.1 F (36.7 C) (Oral)   Resp 18   Ht 5\' 10"  (1.778 m)   Wt 150 lb (68 kg)   LMP 06/24/2023 (Exact Date)   SpO2 96%   BMI 21.52 kg/m      Physical Exam Vitals and nursing note reviewed.  Constitutional:      General: She is not in acute distress.    Appearance: Normal appearance. She is not ill-appearing.  HENT:     Head: Normocephalic and atraumatic.  Eyes:     Conjunctiva/sclera:  Conjunctivae normal.  Cardiovascular:     Rate and Rhythm: Normal rate.  Pulmonary:     Effort: Pulmonary effort is normal. No respiratory distress.  Skin:    Comments: Dry scaling rash appreciated to fourth and  fifth right fingers  Neurological:     Mental Status: She is alert.  Psychiatric:        Mood and Affect: Mood normal.        Behavior: Behavior normal.        Thought Content: Thought content normal.      UC Treatments / Results  Labs (all labs ordered are listed, but only abnormal results are displayed) Labs Reviewed - No data to display  EKG   Radiology No results found.  Procedures Procedures (including critical care time)  Medications Ordered in UC Medications - No data to display  Initial Impression / Assessment and Plan / UC Course  I have reviewed the triage vital signs and the nursing notes.  Pertinent labs & imaging results that were available during my care of the patient were reviewed by me and considered in my medical decision making (see chart for details).    Rash seemingly consistent with dermatitis versus eczema.  Will treat with steroid cream and recommended follow-up if no gradual improvement or with any further concerns.  Patient expresses understanding  Final Clinical Impressions(s) / UC Diagnoses   Final diagnoses:  Dermatitis   Discharge Instructions   None    ED Prescriptions     Medication Sig Dispense Auth. Provider   triamcinolone cream (KENALOG) 0.1 % Apply 1 Application topically 2 (two) times daily. 30 g Tomi Bamberger, PA-C      PDMP not reviewed this encounter.   Tomi Bamberger, PA-C 07/14/23 [phone]

## 2023-07-14 NOTE — ED Triage Notes (Signed)
"  I have a rash on my right hand that won't go away". No swelling.

## 2023-10-25 ENCOUNTER — Other Ambulatory Visit: Payer: Self-pay

## 2023-10-25 DIAGNOSIS — A6000 Herpesviral infection of urogenital system, unspecified: Secondary | ICD-10-CM

## 2023-10-25 MED ORDER — VALACYCLOVIR HCL 1 G PO TABS: 1000.0000 mg | ORAL_TABLET | Freq: Two times a day (BID) | ORAL | 5 refills | Status: DC | PRN

## 2023-11-26 ENCOUNTER — Ambulatory Visit: Payer: Medicaid Other | Admitting: Obstetrics

## 2024-02-03 ENCOUNTER — Ambulatory Visit: Payer: Medicaid Other

## 2024-02-10 ENCOUNTER — Ambulatory Visit: Admitting: Obstetrics and Gynecology

## 2024-03-11 ENCOUNTER — Encounter (HOSPITAL_COMMUNITY): Payer: Self-pay | Admitting: *Deleted

## 2024-03-11 ENCOUNTER — Emergency Department (HOSPITAL_COMMUNITY)

## 2024-03-11 ENCOUNTER — Other Ambulatory Visit: Payer: Self-pay

## 2024-03-11 ENCOUNTER — Emergency Department (HOSPITAL_COMMUNITY)
Admission: EM | Admit: 2024-03-11 | Discharge: 2024-03-11 | Disposition: A | Discharge status: Home / Self Care | Attending: Student | Admitting: Student

## 2024-03-11 ENCOUNTER — Other Ambulatory Visit (HOSPITAL_COMMUNITY)

## 2024-03-11 DIAGNOSIS — Z3200 Encounter for pregnancy test, result unknown: Secondary | ICD-10-CM | POA: Diagnosis not present

## 2024-03-11 DIAGNOSIS — Z3A00 Weeks of gestation of pregnancy not specified: Secondary | ICD-10-CM | POA: Diagnosis not present

## 2024-03-11 DIAGNOSIS — N939 Abnormal uterine and vaginal bleeding, unspecified: Secondary | ICD-10-CM

## 2024-03-11 DIAGNOSIS — O209 Hemorrhage in early pregnancy, unspecified: Secondary | ICD-10-CM | POA: Insufficient documentation

## 2024-03-11 DIAGNOSIS — R1084 Generalized abdominal pain: Secondary | ICD-10-CM | POA: Diagnosis present

## 2024-03-11 LAB — COMPREHENSIVE METABOLIC PANEL WITH GFR
ALT: 11 U/L (ref 0–44)
AST: 17 U/L (ref 15–41)
Albumin: 3.5 g/dL (ref 3.5–5.0)
Alkaline Phosphatase: 43 U/L (ref 38–126)
Anion gap: 6 (ref 5–15)
BUN: 14 mg/dL (ref 6–20)
CO2: 23 mmol/L (ref 22–32)
Calcium: 9.2 mg/dL (ref 8.9–10.3)
Chloride: 108 mmol/L (ref 98–111)
Creatinine, Ser: 0.7 mg/dL (ref 0.44–1.00)
GFR, Estimated: >60 mL/min (ref >60)
Glucose, Bld: 90 mg/dL (ref 70–99)
Potassium: 4 mmol/L (ref 3.5–5.1)
Sodium: 137 mmol/L (ref 135–145)
Total Bilirubin: 0.5 mg/dL (ref 0.0–1.2)
Total Protein: 6.3 g/dL — ABNORMAL LOW (ref 6.5–8.1)

## 2024-03-11 LAB — CBC
HCT: 37 % (ref 36.0–46.0)
Hemoglobin: 12.1 g/dL (ref 12.0–15.0)
MCH: 30 pg (ref 26.0–34.0)
MCHC: 32.7 g/dL (ref 30.0–36.0)
MCV: 91.8 fL (ref 80.0–100.0)
Platelets: 280 10*3/uL (ref 150–400)
RBC: 4.03 MIL/uL (ref 3.87–5.11)
RDW: 14.3 % (ref 11.5–15.5)
WBC: 7.7 10*3/uL (ref 4.0–10.5)
nRBC: 0 % (ref 0.0–0.2)

## 2024-03-11 LAB — HCG, QUANTITATIVE, PREGNANCY: hCG, Beta Chain, Quant, S: 2479 m[IU]/mL — ABNORMAL HIGH (ref <5)

## 2024-03-11 NOTE — ED Notes (Signed)
 Patient transported to Ultrasound

## 2024-03-11 NOTE — ED Notes (Signed)
 Pt returned from US . RR even and unlabored.

## 2024-03-11 NOTE — Discharge Instructions (Addendum)
 It was a pleasure taking part in your care.  As discussed, with blood work we have confirmed you are pregnant.  Estimated gestational age is 5 weeks 1 day.  [facility] days for repeat beta hCG level.  Please present to the [facility] in 2 days for repeat beta-hCG level.  You will need repeat ultrasound in 14 days.  Return to the ED with any new or worsening symptoms.  Please see the attached work note.

## 2024-03-11 NOTE — ED Triage Notes (Signed)
 The pts lmp was April 6th  she saw blood  from her vagina this am spotting  she now has abd cramps   she took a home preg test and it was positive

## 2024-03-11 NOTE — ED Provider Notes (Signed)
 [place] EMERGENCY DEPARTMENT AT [facility] Provider Note   CSN: [id number] Arrival date & time: 03/11/24  1614     History  Chief Complaint  Patient presents with   wants a preg test    Pete Striano is a 33 y.o. female who presents to the ED for evaluation of abdominal pain, vaginal bleeding.  She is G6, P4.  Patient reports that she is concerned she is pregnant.  Has taken a home pregnancy test which was positive.  LMP end of March.  States that abdominal cramping and vaginal bleeding has been going on for 2 days.  Denies fevers, nausea or vomiting.  Denies diarrhea.  She is G6 P4, 2 miscarriages.   HPI     Home Medications Prior to Admission medications   Medication Sig Start Date End Date Taking? Authorizing Provider  acetaminophen  (TYLENOL ) 500 MG tablet Take 1,000 mg by mouth 2 (two) times daily as needed (pain).    [provider]  Blood Pressure Monitoring (BLOOD PRESSURE KIT) DEVI 1 kit by Does not apply route once a week. Check Blood Pressure regularly and record readings into the Babys[employer] App.  Large Cuff.  DX O90.0 Patient not taking: Reported on 05/28/2022 03/12/22   Rasch, Bridgette Campus I, NP  Calcium  Carbonate Antacid (TUMS CHEWY BITES PO) Take 1 tablet by mouth 3 (three) times daily as needed (heartburn).    [provider]  nitrofurantoin , macrocrystal-monohydrate, (MACROBID ) 100 MG capsule Take 1 capsule (100 mg total) by mouth 2 (two) times daily. 07/10/22   Abigail Abler, MD  norethindrone-ethinyl estradiol 1/35 (ORTHO-NOVUM  1/35, 28,) tablet Take 1 tablet by mouth daily. 09/23/22   Gabrielle Joiner, MD  phenazopyridine  (PYRIDIUM ) 200 MG tablet Take 1 tablet (200 mg total) by mouth 3 (three) times daily as needed for pain (urethral spasm). 07/10/22   Abigail Abler, MD  Prenatal MV & Min w/FA-DHA (PRENATAL GUMMIES PO) Take 1 tablet by mouth daily in the afternoon.    [provider]  triamcinolone  cream (KENALOG ) 0.1 % Apply 1  Application topically 2 (two) times daily. 07/14/23   Vernestine Gondola, PA-C  valACYclovir  (VALTREX ) 1000 MG tablet Take 1 tablet (1,000 mg total) by mouth 2 (two) times daily as needed (for outbreaks). Take for 3 days prn each outbreak. 10/25/23   Gabrielle Joiner, MD      Allergies    Patient has no known allergies.    Review of Systems   Review of Systems  Gastrointestinal:  Positive for abdominal pain.  Genitourinary:  Positive for vaginal bleeding.  All other systems reviewed and are negative.   Physical Exam Updated Vital Signs BP 105/75 (BP Location: Right Arm)   Pulse 76   Temp 98.3 F (36.8 C) (Oral)   Resp 15   Ht 5\' 10"  (1.778 m)   Wt 68 kg   LMP 02/13/2024   SpO2 98%   BMI 21.51 kg/m  Physical Exam Vitals and nursing note reviewed.  Constitutional:      General: She is not in acute distress.    Appearance: She is well-developed.  HENT:     Head: Normocephalic and atraumatic.  Eyes:     Conjunctiva/sclera: Conjunctivae normal.  Cardiovascular:     Rate and Rhythm: Normal rate and regular rhythm.     Heart sounds: No murmur heard. Pulmonary:     Effort: Pulmonary effort is normal. No respiratory distress.     Breath sounds: Normal breath sounds.  Abdominal:  Palpations: Abdomen is soft.     Tenderness: There is no abdominal tenderness.     Comments: Abdomen soft and compressible.  No tenderness noted.  No CVA tenderness bilaterally.  Musculoskeletal:        General: No swelling.     Cervical back: Neck supple.  Skin:    General: Skin is warm and dry.     Capillary Refill: Capillary refill takes less than 2 seconds.  Neurological:     Mental Status: She is alert and oriented to person, place, and time.  Psychiatric:        Mood and Affect: Mood normal.     ED Results / Procedures / Treatments   Labs (all labs ordered are listed, but only abnormal results are displayed) Labs Reviewed  HCG, QUANTITATIVE, PREGNANCY - Abnormal; Notable for the  following components:      Result Value   hCG, Beta Chain, Quant, S 2,479 (*)    All other components within normal limits  COMPREHENSIVE METABOLIC PANEL WITH GFR - Abnormal; Notable for the following components:   Total Protein 6.3 (*)    All other components within normal limits  CBC  ABO/RH    EKG None  Radiology US  OB LESS THAN 14 WEEKS WITH OB TRANSVAGINAL Result Date: 03/11/2024 CLINICAL DATA:  Vaginal bleeding, known positive pregnancy test EXAM: OBSTETRIC <14 WK US  AND TRANSVAGINAL OB US  TECHNIQUE: Both transabdominal and transvaginal ultrasound examinations were performed for complete evaluation of the gestation as well as the maternal uterus, adnexal regions, and pelvic cul-de-sac. Transvaginal technique was performed to assess early pregnancy. COMPARISON:  None Available. FINDINGS: Intrauterine gestational sac: Present Yolk sac:  Absent Embryo:  Absent MSD: 4.3 mm   5 w   1 d Subchorionic hemorrhage:  None visualized. Maternal uterus/adnexae: Corpus luteum cyst is noted in the right ovary. No free fluid is noted. IMPRESSION: Probable early intrauterine gestational sac, but no yolk sac, fetal pole, or cardiac activity yet visualized. Recommend follow-up quantitative B-HCG levels and follow-up US  in 14 days to assess viability. This recommendation follows SRU consensus guidelines: Diagnostic Criteria for Nonviable Pregnancy Early in the First Trimester. Mel Spine Med 2013; 161:0960-45. Electronically Signed   By: Violeta Grey M.D.   On: 03/11/2024 19:03    Procedures Procedures    Medications Ordered in ED Medications - No data to display  ED Course/ Medical Decision Making/ A&P  Medical Decision Making Amount and/or Complexity of Data Reviewed Labs: ordered. Radiology: ordered.   33 year old female presents for evaluation.  Please see HPI for further details.  On examination patient is afebrile and nontachycardic.  Her lung sounds are clear bilaterally, she is not hypoxic.   Abdomen soft and compressible with no tenderness noted.  No CVA tenderness bilaterally.  Neurological examination at baseline.  Patient overall nontoxic in appearance with reassuring vital signs.  Will collect basic labs.  Will confirm pregnancy with hCG quant.  Patient hCG quantitative here 2004 and 79.  CBC without leukocytosis or anemia.  Metabolic panel without electrolyte derangement, no elevated LFTs, anion gap 6.  Will add on ABO Rh.  Will also assess with ultrasound.  ABO Rh collected.  Ultrasound imaging shows probable early intrauterine gestational sac, but no yolk sac, fetal pole, or cardiac activity visualized.  Ultrasound recommends having the patient follow-up for quantitative beta-hCG levels and follow-up ultrasound in 14 days to assess viability.  Patient was advised to return to MAU in 2 days for repeat beta-hCG as well  as repeat ultrasound in 14 days.  She states that she will follow-up with her OB/GYN this week.  She was advised to return to the ED with any new or worsening symptoms and she voiced understanding.  She is stable to discharge home.  Final Clinical Impression(s) / ED Diagnoses Final diagnoses:  Generalized abdominal pain  Vaginal bleeding  Encounter for confirmation of pregnancy test result with physical examination    Rx / DC Orders ED Discharge Orders     None         Kristin Peyer 03/11/24 1956    Karlyn Overman, MD 03/12/24 1103

## 2024-03-11 NOTE — ED Provider Triage Note (Signed)
 Emergency Medicine Provider Triage Evaluation Note  Carrie Nielsen , a 33 y.o. female  was evaluated in triage.  Pt complains of abdominal cramping and vaginal bleeding.  Patient reports that she is concerned she is pregnant.  Has taken a home pregnancy test which was positive.  LMP end of March.  States that abdominal cramping and vaginal bleeding has been going on for 2 days.  Denies fevers, nausea or vomiting.  Denies diarrhea.  She is G6 P4, 2 miscarriages.  Review of Systems  Positive:  Negative:   Physical Exam  BP 105/75 (BP Location: Right Arm)   Pulse 76   Temp 98.3 F (36.8 C) (Oral)   Resp 15   Ht 5\' 10"  (1.778 m)   Wt 68 kg   LMP 02/13/2024   SpO2 98%   BMI 21.51 kg/m  Gen:   Awake, no distress   Resp:  Normal effort  MSK:   Moves extremities without difficulty  Other:    Medical Decision Making  Medically screening exam initiated at 4:57 PM.  Appropriate orders placed.  Carrie Nielsen was informed that the remainder of the evaluation will be completed by another provider, this initial triage assessment does not replace that evaluation, and the importance of remaining in the ED until their evaluation is complete.     Adel Aden, PA-C 03/11/24 1658

## 2024-03-12 LAB — ABO/RH: ABO/RH(D): O POS

## 2024-03-23 ENCOUNTER — Ambulatory Visit: Admitting: Obstetrics & Gynecology

## 2024-03-30 ENCOUNTER — Inpatient Hospital Stay (HOSPITAL_COMMUNITY)

## 2024-03-30 ENCOUNTER — Inpatient Hospital Stay (HOSPITAL_COMMUNITY)
Admission: AD | Admit: 2024-03-30 | Discharge: 2024-03-30 | Discharge status: Against Medical Advice | Attending: Obstetrics and Gynecology | Admitting: Obstetrics and Gynecology

## 2024-03-30 ENCOUNTER — Other Ambulatory Visit: Payer: Self-pay

## 2024-03-30 ENCOUNTER — Encounter (HOSPITAL_COMMUNITY): Payer: Self-pay | Admitting: *Deleted

## 2024-03-30 DIAGNOSIS — O26891 Other specified pregnancy related conditions, first trimester: Secondary | ICD-10-CM | POA: Insufficient documentation

## 2024-03-30 DIAGNOSIS — Z3A01 Less than 8 weeks gestation of pregnancy: Secondary | ICD-10-CM | POA: Insufficient documentation

## 2024-03-30 DIAGNOSIS — O208 Other hemorrhage in early pregnancy: Secondary | ICD-10-CM | POA: Insufficient documentation

## 2024-03-30 DIAGNOSIS — R109 Unspecified abdominal pain: Secondary | ICD-10-CM | POA: Diagnosis present

## 2024-03-30 LAB — WET PREP, GENITAL
Clue Cells Wet Prep HPF POC: NONE SEEN
Sperm: NONE SEEN
Trich, Wet Prep: NONE SEEN
WBC, Wet Prep HPF POC: >=10 — AB (ref <10)
Yeast Wet Prep HPF POC: NONE SEEN

## 2024-03-30 LAB — HCG, QUANTITATIVE, PREGNANCY: hCG, Beta Chain, Quant, S: 53052 m[IU]/mL — ABNORMAL HIGH (ref <5)

## 2024-03-30 LAB — GC/CHLAMYDIA PROBE AMP (~~LOC~~) NOT AT ARMC
Chlamydia: NEGATIVE
Comment: NEGATIVE
Comment: NORMAL
Neisseria Gonorrhea: NEGATIVE

## 2024-03-30 NOTE — MAU Note (Signed)
 Carrie Nielsen is a 33 y.o. at Unknown here in MAU reporting: she was instructed to return for follow up HCG.  Denies VB, reports cramping.  LMP: 02/13/2024 Onset of complaint: ongoing Pain score: 5 Vitals:   03/30/24 1008  BP: 114/66  Pulse: 74  Resp: 18  Temp: 97.9 F (36.6 C)  SpO2: 100%     FHT: NA  Lab orders placed from triage: None

## 2024-03-30 NOTE — MAU Provider Note (Signed)
 History     CSN: [id number]  Arrival date and time: 03/30/24 0937   None     No chief complaint on file.  HPI Patient presenting for abdominal pain in pregnancy..  Per nurse report she reports ported that she was supposed to come back for an hCG follow-up.  This is on 5/3.  She was also having cramping issues. OB History     Gravida  7   Para  4   Term  4   Preterm      AB  2   Living  4      SAB  1   IAB  1   Ectopic      Multiple  0   Live Births  4           Past Medical History:  Diagnosis Date   Bartholin cyst 06/19/2021   History of cold sores 06/19/2021   Pregnancy induced hypertension    Vaginal Pap smear, abnormal 11/08/2020    Past Surgical History:  Procedure Laterality Date   BARTHOLIN GLAND CYST EXCISION N/A 06/24/2021   Procedure: BARTHOLIN GLAND EXCISION;  Surgeon: Othelia Blinks, MD;  Location: [facility];  Service: Gynecology;  Laterality: N/A;   IUD INSERTION  07/05/2022   WISDOM TOOTH EXTRACTION     yrs ago    Family History  Problem Relation Age of Onset   Cancer Maternal Grandfather        brain   Alcohol abuse Neg Hx    Arthritis Neg Hx    Asthma Neg Hx    Birth defects Neg Hx    COPD Neg Hx    Depression Neg Hx    Diabetes Neg Hx    Early death Neg Hx    Drug abuse Neg Hx    Hearing loss Neg Hx    Heart disease Neg Hx    Hyperlipidemia Neg Hx    Hypertension Neg Hx    Kidney disease Neg Hx    Learning disabilities Neg Hx    Mental illness Neg Hx    Mental retardation Neg Hx    Miscarriages / Stillbirths Neg Hx    Stroke Neg Hx    Vision loss Neg Hx     Social History   Tobacco Use   Smoking status: Never   Smokeless tobacco: Never  Vaping Use   Vaping status: Never Used  Substance Use Topics   Alcohol use: Not Currently   Drug use: Not Currently    Allergies: No Known Allergies  No medications prior to admission.    Review of Systems Physical Exam   Blood pressure  114/66, pulse 74, temperature 97.9 F (36.6 C), temperature source Oral, resp. rate 18, height 5\' 10"  (1.778 m), weight 58.1 kg, last menstrual period 02/13/2024, SpO2 100%, not currently breastfeeding.  Physical Exam  MAU Course  Procedures  MDM Ultrasound Beta-hCG   Assessment and Plan  Carrie Nielsen is a 33 year old female who presented for cramping and follow-up visit.  Patient reported to the nursing staff that she was having cramping and was told to come back for follow-up hCG.  Upon chart review it appears she was seen on 5/3 and gestational sac was appreciated but no yolk sac or fetal pole.  She was instructed to follow-up for a viability scan in 10 to 14 days but that did not occur.  Ultrasound ordered from the lobby along with lab work.  Patient had the lab work drawn  and the ultrasound performed but left prior to the results.  She also left prior to being evaluated by provider.  [name] V [name] 03/30/2024, 12:23 PM

## 2024-03-30 NOTE — MAU Note (Signed)
 Pt informed Admitting that she needs to leave. Signed out AMA

## 2024-04-12 ENCOUNTER — Ambulatory Visit: Admitting: Obstetrics and Gynecology

## 2024-04-15 ENCOUNTER — Telehealth: Payer: Self-pay

## 2024-04-15 NOTE — Telephone Encounter (Signed)
 Patient called into the ED, transferred to [facility]. She states  she was seen here " a little bit ago and mentioned  [facility]. She stated she was given some pills to take, however she  vomited and threw one of them up. She is having some abdominal pain, but is not spotting now. It is difficult to tell what is prescribed to her, as it is documented she left AMA. Called the nurse line at women's health and spoke to a triage person that would send nurses a message to call the patient ASAP to discuss

## 2024-05-04 ENCOUNTER — Ambulatory Visit: Admitting: Obstetrics

## 2024-06-02 ENCOUNTER — Encounter: Payer: Self-pay | Admitting: Obstetrics

## 2024-06-02 ENCOUNTER — Other Ambulatory Visit (HOSPITAL_COMMUNITY)
Admission: RE | Admit: 2024-06-02 | Discharge: 2024-06-02 | Disposition: A | Discharge status: Home / Self Care | Source: Ambulatory Visit | Attending: Obstetrics | Admitting: Obstetrics

## 2024-06-02 ENCOUNTER — Ambulatory Visit: Admitting: Obstetrics

## 2024-06-02 VITALS — BP 113/75 | HR 60 | Ht 70.0 in | Wt 158.1 lb

## 2024-06-02 DIAGNOSIS — O036 Delayed or excessive hemorrhage following complete or unspecified spontaneous abortion: Secondary | ICD-10-CM

## 2024-06-02 DIAGNOSIS — D500 Iron deficiency anemia secondary to blood loss (chronic): Secondary | ICD-10-CM | POA: Diagnosis not present

## 2024-06-02 DIAGNOSIS — N898 Other specified noninflammatory disorders of vagina: Secondary | ICD-10-CM | POA: Insufficient documentation

## 2024-06-02 DIAGNOSIS — Z113 Encounter for screening for infections with a predominantly sexual mode of transmission: Secondary | ICD-10-CM | POA: Diagnosis not present

## 2024-06-02 MED ORDER — MEGESTROL ACETATE 40 MG PO TABS: 80.0000 mg | ORAL_TABLET | Freq: Two times a day (BID) | ORAL | 0 refills | Status: DC

## 2024-06-02 NOTE — Progress Notes (Signed)
 Patient ID: Carrie Nielsen, female   DOB: [birth year], 33 y.o.   MRN: [id number]  HPI Carrie Nielsen is a 33 y.o. female.  Complains of heavy vaginal bleeding since TAB a month ago.  Denies cramping or fever. HPI  Past Medical History:  Diagnosis Date   Bartholin cyst 06/19/2021   History of cold sores 06/19/2021   Pregnancy induced hypertension    Vaginal Pap smear, abnormal 11/08/2020    Past Surgical History:  Procedure Laterality Date   BARTHOLIN GLAND CYST EXCISION N/A 06/24/2021   Procedure: BARTHOLIN GLAND EXCISION;  Surgeon: Lorence Ozell CROME, MD;  Location: [facility];  Service: Gynecology;  Laterality: N/A;   IUD INSERTION  07/05/2022   WISDOM TOOTH EXTRACTION     yrs ago    Family History  Problem Relation Age of Onset   Cancer Maternal Grandfather        brain   Alcohol abuse Neg Hx    Arthritis Neg Hx    Asthma Neg Hx    Birth defects Neg Hx    COPD Neg Hx    Depression Neg Hx    Diabetes Neg Hx    Early death Neg Hx    Drug abuse Neg Hx    Hearing loss Neg Hx    Heart disease Neg Hx    Hyperlipidemia Neg Hx    Hypertension Neg Hx    Kidney disease Neg Hx    Learning disabilities Neg Hx    Mental illness Neg Hx    Mental retardation Neg Hx    Miscarriages / Stillbirths Neg Hx    Stroke Neg Hx    Vision loss Neg Hx     Social History Social History   Tobacco Use   Smoking status: Never   Smokeless tobacco: Never  Vaping Use   Vaping status: Never Used  Substance Use Topics   Alcohol use: Not Currently   Drug use: Not Currently    No Known Allergies  Current Outpatient Medications  Medication Sig Dispense Refill   megestrol (MEGACE) 40 MG tablet Take 2 tablets (80 mg total) by mouth 2 (two) times daily. 40 tablet 0   acetaminophen  (TYLENOL ) 500 MG tablet Take 1,000 mg by mouth 2 (two) times daily as needed (pain). (Patient not taking: Reported on 06/02/2024)     Blood Pressure Monitoring (BLOOD PRESSURE KIT) DEVI 1 kit by Does  not apply route once a week. Check Blood Pressure regularly and record readings into the [employer] App.  Large Cuff.  DX O90.0 (Patient not taking: Reported on 06/02/2024) 1 each 0   Calcium  Carbonate Antacid (TUMS CHEWY BITES PO) Take 1 tablet by mouth 3 (three) times daily as needed (heartburn). (Patient not taking: Reported on 06/02/2024)     nitrofurantoin , macrocrystal-monohydrate, (MACROBID ) 100 MG capsule Take 1 capsule (100 mg total) by mouth 2 (two) times daily. (Patient not taking: Reported on 06/02/2024) 10 capsule 0   phenazopyridine  (PYRIDIUM ) 200 MG tablet Take 1 tablet (200 mg total) by mouth 3 (three) times daily as needed for pain (urethral spasm). (Patient not taking: Reported on 06/02/2024) 10 tablet 1   Prenatal MV & Min w/FA-DHA (PRENATAL GUMMIES PO) Take 1 tablet by mouth daily in the afternoon. (Patient not taking: Reported on 06/02/2024)     triamcinolone  cream (KENALOG ) 0.1 % Apply 1 Application topically 2 (two) times daily. (Patient not taking: Reported on 06/02/2024) 30 g 0   valACYclovir  (VALTREX ) 1000 MG tablet Take 1 tablet (1,000 mg  total) by mouth 2 (two) times daily as needed (for outbreaks). Take for 3 days prn each outbreak. (Patient not taking: Reported on 06/02/2024) 30 tablet 5   No current facility-administered medications for this visit.    Review of Systems Review of Systems Constitutional: negative for fatigue and weight loss Respiratory: negative for cough and wheezing Cardiovascular: negative for chest pain, fatigue and palpitations Gastrointestinal: negative for abdominal pain and change in bowel habits Genitourinary: positive for heavy vaginal bleeding since TAB Integument/breast: negative for nipple discharge Musculoskeletal:negative for myalgias Neurological: negative for gait problems and tremors Behavioral/Psych: negative for abusive relationship, depression Endocrine: negative for temperature intolerance      Blood pressure 113/75, pulse 60,  height 5' 10 (1.778 m), weight 158 lb 1.6 oz (71.7 kg), last menstrual period 04/12/2024, unknown if currently breastfeeding.  Physical Exam Physical Exam General:   Alert and no distress  Skin:   no rash or abnormalities  Lungs:   clear to auscultation bilaterally  Heart:   regular rate and rhythm, S1, S2 normal, no murmur, click, rub or gallop  Breasts:   normal without suspicious masses, skin or nipple changes or axillary nodes  Abdomen:  normal findings: no organomegaly, soft, non-tender and no hernia  Pelvis:  External genitalia: normal general appearance Urinary system: urethral meatus normal and bladder without fullness, nontender Vaginal: normal without tenderness, induration or masses.  Heme in vault. Cervix: normal appearance.  Closed. Adnexa: normal bimanual exam Uterus: anteverted and non-tender, normal size    I have spent a total of 20 minutes of face-to-face time, excluding clinical staff time, reviewing notes and preparing to see patient, ordering tests and/or medications, and counseling the patient.   Data Reviewed Wet Prep nd Cultures  Assessment     1. Abortion complicated by delayed or excessive hemorrhage (Primary) Rx: - megestrol (MEGACE) 40 MG tablet; Take 2 tablets (80 mg total) by mouth 2 (two) times daily.  Dispense: 40 tablet; Refill: 0 - US  PELVIC COMPLETE WITH TRANSVAGINAL; Future  2. Vaginal discharge Rx: - Cervicovaginal ancillary only( [place])  3. Screening for STD (sexually transmitted disease) Rx: - HIV antibody (with reflex) - RPR - Hepatitis C Antibody - Hepatitis B Surface AntiGEN  4. Iron deficiency anemia due to chronic blood loss Rx: - CBC - Comp Met (CMET) - Ferritin - TSH     Plan   Follow up in 2 weeks.  Orders Placed This Encounter  Procedures   US  PELVIC COMPLETE WITH TRANSVAGINAL    Standing Status:   Future    Expiration Date:   06/02/2025    Reason for Exam (SYMPTOM  OR DIAGNOSIS REQUIRED):   AUB after TAB.   R/O retained POC.    Preferred imaging location?:   [facility] Ultrasound   HIV antibody (with reflex)   RPR   Hepatitis C Antibody   Hepatitis B Surface AntiGEN   CBC   Comp Met (CMET)   Ferritin   TSH   Meds ordered this encounter  Medications   megestrol (MEGACE) 40 MG tablet    Sig: Take 2 tablets (80 mg total) by mouth 2 (two) times daily.    Dispense:  40 tablet    Refill:  0     CARLIN RONAL CENTERS, MD, FACOG Attending Obstetrician & Gynecologist, [facility] for [facility], [facility], [place] 06/02/2024

## 2024-06-02 NOTE — Progress Notes (Signed)
 Pt presents for bc and all std testing. Pt states that she had a ab 6/4 and has not stopped bleeding since.

## 2024-06-03 LAB — CBC
Hematocrit: 37.7 % (ref 34.0–46.6)
Hemoglobin: 11.7 g/dL (ref 11.1–15.9)
MCH: 28.7 pg (ref 26.6–33.0)
MCHC: 31 g/dL — ABNORMAL LOW (ref 31.5–35.7)
MCV: 92 fL (ref 79–97)
Platelets: 317 x10E3/uL (ref 150–450)
RBC: 4.08 x10E6/uL (ref 3.77–5.28)
RDW: 13.1 % (ref 11.7–15.4)
WBC: 5 x10E3/uL (ref 3.4–10.8)

## 2024-06-03 LAB — COMPREHENSIVE METABOLIC PANEL WITH GFR
ALT: 9 IU/L (ref 0–32)
AST: 17 IU/L (ref 0–40)
Albumin: 4.3 g/dL (ref 3.9–4.9)
Alkaline Phosphatase: 62 IU/L (ref 44–121)
BUN/Creatinine Ratio: 20 (ref 9–23)
BUN: 16 mg/dL (ref 6–20)
Bilirubin Total: 0.4 mg/dL (ref 0.0–1.2)
CO2: 20 mmol/L (ref 20–29)
Calcium: 9.5 mg/dL (ref 8.7–10.2)
Chloride: 105 mmol/L (ref 96–106)
Creatinine, Ser: 0.82 mg/dL (ref 0.57–1.00)
Globulin, Total: 2.9 g/dL (ref 1.5–4.5)
Glucose: 86 mg/dL (ref 70–99)
Potassium: 4 mmol/L (ref 3.5–5.2)
Sodium: 139 mmol/L (ref 134–144)
Total Protein: 7.2 g/dL (ref 6.0–8.5)
eGFR: 98 mL/min/1.73 (ref >59)

## 2024-06-03 LAB — TSH: TSH: 0.67 u[IU]/mL (ref 0.450–4.500)

## 2024-06-03 LAB — HEPATITIS C ANTIBODY: Hep C Virus Ab: NONREACTIVE

## 2024-06-03 LAB — HIV ANTIBODY (ROUTINE TESTING W REFLEX): HIV Screen 4th Generation wRfx: NONREACTIVE

## 2024-06-03 LAB — HEPATITIS B SURFACE ANTIGEN: Hepatitis B Surface Ag: NEGATIVE

## 2024-06-03 LAB — RPR: RPR Ser Ql: NONREACTIVE

## 2024-06-03 LAB — FERRITIN: Ferritin: 13 ng/mL — ABNORMAL LOW (ref 15–150)

## 2024-06-05 LAB — CERVICOVAGINAL ANCILLARY ONLY
Bacterial Vaginitis (gardnerella): POSITIVE — AB
Candida Glabrata: NEGATIVE
Candida Vaginitis: NEGATIVE
Chlamydia: NEGATIVE
Comment: NEGATIVE
Comment: NEGATIVE
Comment: NEGATIVE
Comment: NEGATIVE
Comment: NEGATIVE
Comment: NORMAL
Neisseria Gonorrhea: NEGATIVE
Trichomonas: NEGATIVE

## 2024-06-06 ENCOUNTER — Other Ambulatory Visit: Payer: Self-pay | Admitting: Obstetrics

## 2024-06-06 ENCOUNTER — Ambulatory Visit: Payer: Self-pay | Admitting: Obstetrics

## 2024-06-06 DIAGNOSIS — B9689 Other specified bacterial agents as the cause of diseases classified elsewhere: Secondary | ICD-10-CM

## 2024-06-06 MED ORDER — METRONIDAZOLE 500 MG PO TABS: 500.0000 mg | ORAL_TABLET | Freq: Two times a day (BID) | ORAL | 2 refills | Status: DC

## 2024-06-07 ENCOUNTER — Other Ambulatory Visit: Payer: Self-pay

## 2024-06-07 DIAGNOSIS — O036 Delayed or excessive hemorrhage following complete or unspecified spontaneous abortion: Secondary | ICD-10-CM

## 2024-06-07 MED ORDER — MEGESTROL ACETATE 40 MG PO TABS: 80.0000 mg | ORAL_TABLET | Freq: Two times a day (BID) | ORAL | 0 refills | Status: DC

## 2024-06-08 ENCOUNTER — Ambulatory Visit (HOSPITAL_BASED_OUTPATIENT_CLINIC_OR_DEPARTMENT_OTHER)

## 2024-06-16 ENCOUNTER — Telehealth: Admitting: Obstetrics

## 2024-06-16 DIAGNOSIS — D500 Iron deficiency anemia secondary to blood loss (chronic): Secondary | ICD-10-CM

## 2024-06-16 DIAGNOSIS — O036 Delayed or excessive hemorrhage following complete or unspecified spontaneous abortion: Secondary | ICD-10-CM

## 2024-06-16 MED ORDER — ACCRUFER 30 MG PO CAPS: 1.0000 | ORAL_CAPSULE | Freq: Two times a day (BID) | ORAL | 3 refills | Status: DC

## 2024-06-16 MED ORDER — PRENATAL GUMMIES 0.18-25 MG PO CHEW: 3.0000 | CHEWABLE_TABLET | Freq: Every day | ORAL | 11 refills | Status: DC

## 2024-06-16 NOTE — Progress Notes (Signed)
 Patient did not answer phone.  CARLIN RONAL CENTERS M.D. 06/16/2024

## 2024-08-02 ENCOUNTER — Ambulatory Visit: Admitting: Physician Assistant

## 2024-08-14 DIAGNOSIS — B977 Papillomavirus as the cause of diseases classified elsewhere: Secondary | ICD-10-CM | POA: Insufficient documentation

## 2024-08-22 ENCOUNTER — Ambulatory Visit: Admitting: Advanced Practice Midwife

## 2024-08-22 ENCOUNTER — Encounter: Payer: Self-pay | Admitting: Advanced Practice Midwife

## 2024-09-21 ENCOUNTER — Other Ambulatory Visit: Payer: Self-pay

## 2024-09-21 MED ORDER — VALACYCLOVIR HCL 500 MG PO TABS: 500.0000 mg | ORAL_TABLET | Freq: Two times a day (BID) | ORAL | 1 refills | Status: AC

## 2024-09-21 NOTE — Progress Notes (Signed)
 Pt called requesting refill on valtrex , sent per protocol

## 2024-10-29 ENCOUNTER — Ambulatory Visit (HOSPITAL_COMMUNITY)
Admission: EM | Admit: 2024-10-29 | Discharge: 2024-10-29 | Disposition: A | Discharge status: Home / Self Care | Attending: Emergency Medicine | Admitting: Emergency Medicine

## 2024-10-29 ENCOUNTER — Encounter (HOSPITAL_COMMUNITY): Payer: Self-pay | Admitting: Emergency Medicine

## 2024-10-29 DIAGNOSIS — N898 Other specified noninflammatory disorders of vagina: Secondary | ICD-10-CM | POA: Insufficient documentation

## 2024-10-29 DIAGNOSIS — Z3201 Encounter for pregnancy test, result positive: Secondary | ICD-10-CM | POA: Insufficient documentation

## 2024-10-29 LAB — POCT URINE PREGNANCY: Preg Test, Ur: POSITIVE — AB

## 2024-10-29 NOTE — ED Provider Notes (Signed)
 " [facility]    CSN: [id number] Arrival date & time: 10/29/24  1251      History   Chief Complaint Chief Complaint  Patient presents with   Vaginal Discharge    HPI Carrie Nielsen is a 33 y.o. female.   Patient presents to clinic over concern of vaginal discharge and irritation for the past 2-3 days   She recently tested positive for pregnancy about a week ago Has not been sexually active since she found out she was pregnant  Denies abdominal pain  Increased vaginal discharge  Denies urinary symptoms   OB/GYN appointment in two days   The history is provided by the patient and medical records.  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   IUD (intrauterine device) in place 07/05/2022   Unwanted fertility    Abnormal Pap smear of cervix 03/28/2022   Supervision of other normal pregnancy, antepartum 03/11/2022   ASCUS with positive high risk HPV cervical 11/20/2020   History of herpes genitalis 11/11/2020   Hearing loss due to cerumen impaction, right 11/11/2020   Impacted cerumen of left ear 11/11/2020   Bartholin cyst 04/26/2020   Oral herpes simplex, 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   Esophageal reflux 02/08/2013    Past Surgical History:  Procedure Laterality Date   BARTHOLIN GLAND CYST EXCISION N/A 06/24/2021   Procedure: BARTHOLIN GLAND EXCISION;  Surgeon: Lorence Ozell CROME, MD;  Location: [facility];  Service: Gynecology;  Laterality: N/A;   IUD INSERTION  07/05/2022   WISDOM TOOTH EXTRACTION     yrs ago    OB History     Gravida  8   Para  4   Term  4   Preterm      AB  3   Living  4      SAB  1   IAB  2   Ectopic      Multiple  0   Live Births  4             Home Medications    Prior to Admission medications  Medication Sig Start Date End Date Taking? Authorizing Provider  Ferric Maltol  (ACCRUFER ) 30 MG CAPS Take 1 capsule (30 mg total) by mouth 2 (two) times daily before a meal. Take 2 hrs before, or 2 hrs after a meal. 06/16/24   Rudy Carlin LABOR, MD  valACYclovir  (VALTREX ) 500 MG tablet Take 1 tablet (500 mg total) by mouth 2 (two) times daily. 09/21/24   Rudy Carlin LABOR, MD    Family History Family History  Problem Relation Age of Onset   Cancer Maternal Grandfather        brain   Alcohol abuse Neg Hx    Arthritis Neg Hx    Asthma Neg Hx    Birth defects Neg Hx    COPD Neg Hx    Depression Neg Hx    Diabetes Neg Hx    Early death Neg Hx    Drug abuse Neg Hx    Hearing loss Neg Hx    Heart disease Neg Hx    Hyperlipidemia Neg Hx    Hypertension Neg Hx    Kidney disease Neg Hx  Learning disabilities Neg Hx    Mental illness Neg Hx    Mental retardation Neg Hx    Miscarriages / Stillbirths Neg Hx    Stroke Neg Hx    Vision loss Neg Hx     Social History Social History[1]   Allergies   Patient has no known allergies.   Review of Systems Review of Systems  Per HPI  Physical Exam Triage Vital Signs ED Triage Vitals  Encounter Vitals Group     BP 10/29/24 1501 113/61     Girls Systolic BP Percentile --      Girls Diastolic BP Percentile --      Boys Systolic BP Percentile --      Boys Diastolic BP Percentile --      Pulse Rate 10/29/24 1501 68     Resp 10/29/24 1501 16     Temp 10/29/24 1501 98.5 F (36.9 C)     Temp Source 10/29/24 1501 Oral     SpO2 10/29/24 1501 98 %     Weight --      Height --      Head Circumference --      Peak Flow --      Pain Score 10/29/24 1459 6     Pain Loc --      Pain Education --      Exclude from Growth Chart --    No data found.  Updated Vital Signs BP 113/61 (BP Location: Left Arm)   Pulse 68   Temp 98.5 F (36.9 C) (Oral)   Resp 16   LMP  09/22/2024 (Exact Date)   SpO2 98%   Visual Acuity Right Eye Distance:   Left Eye Distance:   Bilateral Distance:    Right Eye Near:   Left Eye Near:    Bilateral Near:     Physical Exam Vitals and nursing note reviewed. Exam conducted with a chaperone present.  Constitutional:      Appearance: Normal appearance.  HENT:     Head: Normocephalic and atraumatic.     Right Ear: External ear normal.     Left Ear: External ear normal.     Nose: Nose normal.     Mouth/Throat:     Mouth: Mucous membranes are moist.  Eyes:     Conjunctiva/sclera: Conjunctivae normal.  Cardiovascular:     Rate and Rhythm: Normal rate.  Pulmonary:     Effort: Pulmonary effort is normal. No respiratory distress.  Genitourinary:    Exam position: Lithotomy position.     Vagina: Vaginal discharge present.     Comments: Without CMT on bimanual exam  Skin:    General: Skin is warm and dry.  Neurological:     General: No focal deficit present.     Mental Status: She is alert.      UC Treatments / Results  Labs (all labs ordered are listed, but only abnormal results are displayed) Labs Reviewed  POCT URINE PREGNANCY - Abnormal; Notable for the following components:      Result Value   Preg Test, Ur Positive (*)    All other components within normal limits  CERVICOVAGINAL ANCILLARY ONLY    EKG   Radiology No results found.  Procedures Procedures (including critical care time)  Medications Ordered in UC Medications - No data to display  Initial Impression / Assessment and Plan / UC Course  I have reviewed the triage vital signs and the nursing notes.  Pertinent labs & imaging results  that were available during my care of the patient were reviewed by me and considered in my medical decision making (see chart for details).  Vitals and triage reviewed, patient is hemodynamically stable.  Urine pregnancy is positive.  Patient requesting GU exam.  GU exam with chaperone reveals  white/green malodorous discharge.  Negative for CMT. Low clinical concern for PID.   Cytology swab obtained and staff will contact if treatment is needed based on results.  Recommended topical Monistat for vaginal itching, especially in the setting of pregnancy.  Plan of care, follow-up care and return precautions given, no questions at this time.    Final Clinical Impressions(s) / UC Diagnoses   Final diagnoses:  Vaginal discharge  Positive urine pregnancy test     Discharge Instructions      Today you have been tested for vaginal infections and will be contacted if treatment is needed based on the results Results should be available in the next 1-3 days  For external itching and irritation you can use over-the-counter Monistat  Follow-up with your OB/GYN as scheduled. Return to clinic for new or urgent symptoms     ED Prescriptions   None    PDMP not reviewed this encounter.    [1]  Social History Tobacco Use   Smoking status: Never   Smokeless tobacco: Never  Vaping Use   Vaping status: Never Used  Substance Use Topics   Alcohol use: Not Currently   Drug use: Not Currently     Dreama Luana SAILOR, FNP 10/29/24 1557  "

## 2024-10-29 NOTE — Discharge Instructions (Addendum)
 Today you have been tested for vaginal infections and will be contacted if treatment is needed based on the results Results should be available in the next 1-3 days  For external itching and irritation you can use over-the-counter Monistat  Follow-up with your OB/GYN as scheduled. Return to clinic for new or urgent symptoms

## 2024-10-29 NOTE — ED Triage Notes (Signed)
 Pt c/o vaginal irritation for a couple days. Pt reports found out she was pregnant about week ago. LMP 11/14

## 2024-10-30 ENCOUNTER — Ambulatory Visit: Payer: Self-pay | Admitting: Emergency Medicine

## 2024-10-30 LAB — CERVICOVAGINAL ANCILLARY ONLY
Bacterial Vaginitis (gardnerella): POSITIVE — AB
Candida Glabrata: NEGATIVE
Candida Vaginitis: NEGATIVE
Chlamydia: NEGATIVE
Comment: NEGATIVE
Comment: NEGATIVE
Comment: NEGATIVE
Comment: NEGATIVE
Comment: NEGATIVE
Comment: NORMAL
Neisseria Gonorrhea: NEGATIVE
Trichomonas: POSITIVE — AB

## 2024-11-01 MED ORDER — METRONIDAZOLE 500 MG PO TABS: 500.0000 mg | ORAL_TABLET | Freq: Two times a day (BID) | ORAL | 0 refills | Status: AC

## 2024-11-23 ENCOUNTER — Ambulatory Visit (HOSPITAL_COMMUNITY)
Admission: EM | Admit: 2024-11-23 | Discharge: 2024-11-23 | Disposition: A | Discharge status: Home / Self Care | Attending: Internal Medicine | Admitting: Internal Medicine

## 2024-11-23 ENCOUNTER — Encounter (HOSPITAL_COMMUNITY): Payer: Self-pay

## 2024-11-23 DIAGNOSIS — Z113 Encounter for screening for infections with a predominantly sexual mode of transmission: Secondary | ICD-10-CM | POA: Diagnosis not present

## 2024-11-23 DIAGNOSIS — O26891 Other specified pregnancy related conditions, first trimester: Secondary | ICD-10-CM | POA: Insufficient documentation

## 2024-11-23 DIAGNOSIS — Z3A10 10 weeks gestation of pregnancy: Secondary | ICD-10-CM | POA: Insufficient documentation

## 2024-11-23 DIAGNOSIS — N898 Other specified noninflammatory disorders of vagina: Secondary | ICD-10-CM | POA: Insufficient documentation

## 2024-11-23 NOTE — Discharge Instructions (Signed)

## 2024-11-23 NOTE — ED Provider Notes (Signed)
 " [facility]    CSN: [id number] Arrival date & time: 11/23/24  1850      History   Chief Complaint Chief Complaint  Patient presents with   STD testing    HPI Carrie Nielsen is a 34 y.o. female.   Carrie Nielsen is a 34 y.o. female presenting for chief complaint of vaginal discharge and vaginal odor that started a few days ago. Recently tested positive for BV/trichomonas. Took 5 days of flagyl , did not finish full 7 day course since symptoms improved after 5 days. Symptoms returned quickly and she is here for re-testing. She has not been sexually active since last time she was tested for STDs and BV/yeast on 10/30/2024. Currently [redacted] weeks pregnant.  Denies vaginal bleeding, pelvic pain, urinary symptoms, fever/chills, back pain, and dizziness. No vaginal itching.      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   IUD (intrauterine device) in place 07/05/2022   Unwanted fertility    Abnormal Pap smear of cervix 03/28/2022   Supervision of other normal pregnancy, antepartum 03/11/2022   ASCUS with positive high risk HPV cervical 11/20/2020   History of herpes genitalis 11/11/2020   Hearing loss due to cerumen impaction, right 11/11/2020   Impacted cerumen of left ear 11/11/2020   Bartholin cyst 04/26/2020   Oral herpes simplex, 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   Esophageal reflux 02/08/2013    Past Surgical History:  Procedure Laterality Date   BARTHOLIN GLAND CYST EXCISION N/A 06/24/2021   Procedure: BARTHOLIN GLAND EXCISION;  Surgeon: Lorence Ozell CROME, MD;  Location: [facility];  Service: Gynecology;  Laterality: N/A;   IUD INSERTION  07/05/2022   WISDOM TOOTH EXTRACTION     yrs ago    OB History     Gravida  8    Para  4   Term  4   Preterm      AB  3   Living  4      SAB  1   IAB  2   Ectopic      Multiple  0   Live Births  4            Home Medications    Prior to Admission medications  Medication Sig Start Date End Date Taking? Authorizing Provider  Ferric Maltol  (ACCRUFER ) 30 MG CAPS Take 1 capsule (30 mg total) by mouth 2 (two) times daily before a meal. Take 2 hrs before, or 2 hrs after a meal. 06/16/24   Rudy Carlin LABOR, MD  valACYclovir  (VALTREX ) 500 MG tablet Take 1 tablet (500 mg total) by mouth 2 (two) times daily. 09/21/24   Rudy Carlin LABOR, MD    Family History Family History  Problem Relation Age of Onset   Cancer Maternal Grandfather        brain   Alcohol abuse Neg Hx    Arthritis Neg Hx    Asthma Neg Hx    Birth defects Neg Hx    COPD Neg Hx    Depression Neg Hx    Diabetes Neg Hx    Early death Neg Hx    Drug abuse Neg Hx    Hearing loss Neg Hx    Heart disease Neg Hx  Hyperlipidemia Neg Hx    Hypertension Neg Hx    Kidney disease Neg Hx    Learning disabilities Neg Hx    Mental illness Neg Hx    Mental retardation Neg Hx    Miscarriages / Stillbirths Neg Hx    Stroke Neg Hx    Vision loss Neg Hx     Social History Social History[1]   Allergies   Patient has no known allergies.   Review of Systems Review of Systems Per HPI  Physical Exam Triage Vital Signs ED Triage Vitals [11/23/24 1859]  Encounter Vitals Group     BP 113/80     Girls Systolic BP Percentile      Girls Diastolic BP Percentile      Boys Systolic BP Percentile      Boys Diastolic BP Percentile      Pulse Rate 87     Resp 16     Temp 98.3 F (36.8 C)     Temp Source Oral     SpO2 98 %     Weight      Height      Head Circumference      Peak Flow      Pain Score 0     Pain Loc      Pain Education      Exclude from Growth Chart    No data found.  Updated Vital Signs BP 113/80 (BP Location: Left Arm)   Pulse 87   Temp 98.3 F (36.8 C)  (Oral)   Resp 16   LMP 09/22/2024 (Exact Date)   SpO2 98%   Visual Acuity Right Eye Distance:   Left Eye Distance:   Bilateral Distance:    Right Eye Near:   Left Eye Near:    Bilateral Near:     Physical Exam Vitals and nursing note reviewed.  Constitutional:      Appearance: She is not ill-appearing or toxic-appearing.  HENT:     Head: Normocephalic and atraumatic.     Right Ear: Hearing and external ear normal.     Left Ear: Hearing and external ear normal.     Nose: Nose normal.     Mouth/Throat:     Lips: Pink.  Eyes:     General: Lids are normal. Vision grossly intact. Gaze aligned appropriately.     Extraocular Movements: Extraocular movements intact.     Conjunctiva/sclera: Conjunctivae normal.  Pulmonary:     Effort: Pulmonary effort is normal.  Musculoskeletal:     Cervical back: Neck supple.  Skin:    General: Skin is warm and dry.     Capillary Refill: Capillary refill takes less than 2 seconds.     Findings: No rash.  Neurological:     General: No focal deficit present.     Mental Status: She is alert and oriented to person, place, and time. Mental status is at baseline.     Cranial Nerves: No dysarthria or facial asymmetry.  Psychiatric:        Mood and Affect: Mood normal.        Speech: Speech normal.        Behavior: Behavior normal.        Thought Content: Thought content normal.        Judgment: Judgment normal.      UC Treatments / Results  Labs (all labs ordered are listed, but only abnormal results are displayed) Labs Reviewed  CERVICOVAGINAL ANCILLARY ONLY    EKG  Radiology No results found.  Procedures Procedures (including critical care time)  Medications Ordered in UC Medications - No data to display  Initial Impression / Assessment and Plan / UC Course  I have reviewed the triage vital signs and the nursing notes.  Pertinent labs & imaging results that were available during my care of the patient were reviewed by me  and considered in my medical decision making (see chart for details).   1. Screening for STD, vaginal discharge during pregnancy in first trimester, [redacted] weeks gestation pregnancy STI labs pending, will notify patient of positive results and treat accordingly per protocol when labs result.  Patient to avoid sexual intercourse until screening testing comes back.   Education provided regarding safe sexual practices and patient encouraged to use protection to prevent spread of STIs.    Counseled patient on potential for adverse effects with medications prescribed/recommended today, strict ER and return-to-clinic precautions discussed, patient verbalized understanding.   Final Clinical Impressions(s) / UC Diagnoses   Final diagnoses:  Screening for STD (sexually transmitted disease)  Vaginal discharge during pregnancy in first trimester  [redacted] weeks gestation of pregnancy     Discharge Instructions      STD testing pending, this will take 2-3 days to result. We will only call you if your testing is positive for any infection(s) and we will provide treatment.  Avoid sexual intercourse until your STD results come back.  If any of your STD results are positive, you will need to avoid sexual intercourse for 7 days while you are being treated to prevent spread of STD.  Condom use is the best way to prevent spread of STDs. Notify partner(s) of any positive results.  Return to urgent care as needed.       ED Prescriptions   None    PDMP not reviewed this encounter.     [1]  Social History Tobacco Use   Smoking status: Never   Smokeless tobacco: Never  Vaping Use   Vaping status: Never Used  Substance Use Topics   Alcohol use: Not Currently   Drug use: Not Currently     Enedelia Dorna HERO, FNP 11/23/24 1958  "

## 2024-11-23 NOTE — ED Triage Notes (Signed)
 Patient is requesting STD testing. Patient states she is having a white thick discharge with an odor x 3 days.  Patient states she is approx [redacted] weeks pregnant.

## 2024-11-27 ENCOUNTER — Ambulatory Visit (HOSPITAL_COMMUNITY): Payer: Self-pay

## 2024-11-27 ENCOUNTER — Ambulatory Visit: Admitting: Obstetrics

## 2024-11-27 ENCOUNTER — Other Ambulatory Visit (HOSPITAL_COMMUNITY)
Admission: RE | Admit: 2024-11-27 | Discharge: 2024-11-27 | Disposition: A | Discharge status: Home / Self Care | Source: Ambulatory Visit | Attending: Obstetrics | Admitting: Obstetrics

## 2024-11-27 VITALS — BP 125/82 | HR 77 | Wt 163.0 lb

## 2024-11-27 DIAGNOSIS — N898 Other specified noninflammatory disorders of vagina: Secondary | ICD-10-CM

## 2024-11-27 DIAGNOSIS — N760 Acute vaginitis: Secondary | ICD-10-CM

## 2024-11-27 DIAGNOSIS — B9689 Other specified bacterial agents as the cause of diseases classified elsewhere: Secondary | ICD-10-CM

## 2024-11-27 DIAGNOSIS — A5901 Trichomonal vulvovaginitis: Secondary | ICD-10-CM

## 2024-11-27 DIAGNOSIS — Z0000 Encounter for general adult medical examination without abnormal findings: Secondary | ICD-10-CM

## 2024-11-27 LAB — CERVICOVAGINAL ANCILLARY ONLY
Bacterial Vaginitis (gardnerella): POSITIVE — AB
Candida Glabrata: NEGATIVE
Candida Vaginitis: NEGATIVE
Chlamydia: NEGATIVE
Comment: NEGATIVE
Comment: NEGATIVE
Comment: NEGATIVE
Comment: NEGATIVE
Comment: NEGATIVE
Comment: NORMAL
Neisseria Gonorrhea: NEGATIVE
Trichomonas: NEGATIVE

## 2024-11-27 MED ORDER — METRONIDAZOLE 500 MG PO TABS: 500.0000 mg | ORAL_TABLET | Freq: Two times a day (BID) | ORAL | 2 refills | Status: AC

## 2024-11-27 NOTE — Progress Notes (Signed)
 Pt presents for bv symptoms. Pt went to er on 1/15 for same symptoms but did not get meds. For treatment.

## 2024-11-27 NOTE — Progress Notes (Signed)
 Patient ID: Carrie Nielsen, female   DOB: January 07, 1991, 34 y.o.   MRN: [id number]  HPI Carrie Nielsen is a 34 y.o. female.  Complains of malodorous vaginal discharge.  Recently treated for Trichomonas and BV, but partner has not been treated, and she is still sexually active with him.  HPI  Past Medical History:  Diagnosis Date   Bartholin cyst 06/19/2021   History of cold sores 06/19/2021   Pregnancy induced hypertension    Vaginal Pap smear, abnormal 11/08/2020    Past Surgical History:  Procedure Laterality Date   BARTHOLIN GLAND CYST EXCISION N/A 06/24/2021   Procedure: BARTHOLIN GLAND EXCISION;  Surgeon: Lorence Ozell CROME, MD;  Location: [facility];  Service: Gynecology;  Laterality: N/A;   IUD INSERTION  07/05/2022   WISDOM TOOTH EXTRACTION     yrs ago    Family History  Problem Relation Age of Onset   Cancer Maternal Grandfather        brain   Alcohol abuse Neg Hx    Arthritis Neg Hx    Asthma Neg Hx    Birth defects Neg Hx    COPD Neg Hx    Depression Neg Hx    Diabetes Neg Hx    Early death Neg Hx    Drug abuse Neg Hx    Hearing loss Neg Hx    Heart disease Neg Hx    Hyperlipidemia Neg Hx    Hypertension Neg Hx    Kidney disease Neg Hx    Learning disabilities Neg Hx    Mental illness Neg Hx    Mental retardation Neg Hx    Miscarriages / Stillbirths Neg Hx    Stroke Neg Hx    Vision loss Neg Hx     Social History Social History[1]  Allergies[2]  Current Outpatient Medications  Medication Sig Dispense Refill   metroNIDAZOLE  (FLAGYL ) 500 MG tablet Take 1 tablet (500 mg total) by mouth 2 (two) times daily. 14 tablet 2   Ferric Maltol  (ACCRUFER ) 30 MG CAPS Take 1 capsule (30 mg total) by mouth 2 (two) times daily before a meal. Take 2 hrs before, or 2 hrs after a meal. (Patient not taking: Reported on 11/27/2024) 60 capsule 3   valACYclovir  (VALTREX ) 500 MG tablet Take 1 tablet (500 mg total) by mouth 2 (two) times daily. (Patient not taking:  Reported on 11/27/2024) 6 tablet 1   No current facility-administered medications for this visit.    Review of Systems Review of Systems Constitutional: negative for fatigue and weight loss Respiratory: negative for cough and wheezing Cardiovascular: negative for chest pain, fatigue and palpitations Gastrointestinal: negative for abdominal pain and change in bowel habits Genitourinary: positive for malodorous vaginal discharge Integument/breast: negative for nipple discharge Musculoskeletal:negative for myalgias Neurological: negative for gait problems and tremors Behavioral/Psych: negative for abusive relationship, depression Endocrine: negative for temperature intolerance      Blood pressure 125/82, pulse 77, weight 163 lb (73.9 kg), last menstrual period 09/22/2024, not currently breastfeeding.  Physical Exam Physical Exam General:   Alert and no distress  Skin:   no rash or abnormalities  Lungs:   clear to auscultation bilaterally  Heart:   regular rate and rhythm, S1, S2 normal, no murmur, click, rub or gallop  Breasts:   Not examined  Abdomen:  normal findings: no organomegaly, soft, non-tender and no hernia  Pelvis:  External genitalia: normal general appearance Urinary system: urethral meatus normal and bladder without fullness, nontender Vaginal: normal without tenderness,  induration or masses Cervix: normal appearance Adnexa: normal bimanual exam Uterus: anteverted and non-tender, normal size    I have spent a total of 20 minutes of face-to-face time, excluding clinical staff time, reviewing notes and preparing to see patient, ordering tests and/or medications, and counseling the patient.   Data Reviewed Wet Prep and cultures  Assessment     1. Vaginal discharge (Primary) Rx: - Cervicovaginal ancillary only( [place])  2. Trichomonas vaginitis Rx: - metroNIDAZOLE  (FLAGYL ) 500 MG tablet; Take 1 tablet (500 mg total) by mouth 2 (two) times daily.  Dispense:  14 tablet; Refill: 2  3. BV (bacterial vaginosis) Rx: - metroNIDAZOLE  (FLAGYL ) 500 MG tablet; Take 1 tablet (500 mg total) by mouth 2 (two) times daily.  Dispense: 14 tablet; Refill: 2  4. Healthcare maintenance Rx: - Ambulatory referral to Internal Medicine     Plan   Follow up in 3 months for for GC/CT TOC  Orders Placed This Encounter  Procedures   Ambulatory referral to Internal Medicine    Referral Priority:   Routine    Referral Type:   Consultation    Referral Reason:   Specialty Services Required    Requested Specialty:   Internal Medicine    Number of Visits Requested:   1   Meds ordered this encounter  Medications   metroNIDAZOLE  (FLAGYL ) 500 MG tablet    Sig: Take 1 tablet (500 mg total) by mouth 2 (two) times daily.    Dispense:  14 tablet    Refill:  2     CARLIN RONAL CENTERS, MD, FACOG Attending Obstetrician & Gynecologist, [facility], [facility], [facility] 11/27/2024     [1]  Social History Tobacco Use   Smoking status: Never   Smokeless tobacco: Never  Vaping Use   Vaping status: Never Used  Substance Use Topics   Alcohol use: Not Currently   Drug use: Not Currently  [2] No Known Allergies

## 2024-11-28 LAB — CERVICOVAGINAL ANCILLARY ONLY
Bacterial Vaginitis (gardnerella): POSITIVE — AB
Candida Glabrata: NEGATIVE
Candida Vaginitis: NEGATIVE
Chlamydia: NEGATIVE
Comment: NEGATIVE
Comment: NEGATIVE
Comment: NEGATIVE
Comment: NEGATIVE
Comment: NEGATIVE
Comment: NORMAL
Neisseria Gonorrhea: NEGATIVE
Trichomonas: NEGATIVE

## 2024-11-29 ENCOUNTER — Ambulatory Visit: Payer: Self-pay | Admitting: Obstetrics

## 2024-12-13 ENCOUNTER — Ambulatory Visit (HOSPITAL_COMMUNITY): Admit: 2024-12-13 | Discharge: 2024-12-13 | Discharge status: Against Medical Advice

## 2024-12-13 NOTE — ED Notes (Signed)
 Pt states she had a abortion on Friday is having very heavy vaginal bleeding and a foul smell. She was advised to go to ED if she has these sx. Spoke with provider on staff he advised to go to MAU since she is still in that window. Pt aware and verbalized understanding.

## 2024-12-14 ENCOUNTER — Inpatient Hospital Stay (HOSPITAL_COMMUNITY)

## 2024-12-14 ENCOUNTER — Inpatient Hospital Stay (HOSPITAL_COMMUNITY): Admitting: Anesthesiology

## 2024-12-14 ENCOUNTER — Observation Stay (HOSPITAL_COMMUNITY)
Admission: AD | Admit: 2024-12-14 | Discharge: 2024-12-15 | Disposition: A | Source: Home / Self Care | Attending: Obstetrics & Gynecology | Admitting: Obstetrics & Gynecology

## 2024-12-14 ENCOUNTER — Encounter (HOSPITAL_COMMUNITY): Admission: AD | Disposition: A | Payer: Self-pay | Source: Home / Self Care | Attending: Obstetrics & Gynecology

## 2024-12-14 ENCOUNTER — Encounter (HOSPITAL_COMMUNITY): Payer: Self-pay | Admitting: *Deleted

## 2024-12-14 ENCOUNTER — Other Ambulatory Visit: Payer: Self-pay

## 2024-12-14 DIAGNOSIS — Z3A11 11 weeks gestation of pregnancy: Secondary | ICD-10-CM

## 2024-12-14 DIAGNOSIS — O034 Incomplete spontaneous abortion without complication: Principal | ICD-10-CM

## 2024-12-14 DIAGNOSIS — D500 Iron deficiency anemia secondary to blood loss (chronic): Secondary | ICD-10-CM

## 2024-12-14 DIAGNOSIS — Z9889 Other specified postprocedural states: Secondary | ICD-10-CM

## 2024-12-14 LAB — CBC
HCT: 24.3 % — ABNORMAL LOW (ref 36.0–46.0)
Hemoglobin: 8 g/dL — ABNORMAL LOW (ref 12.0–15.0)
MCH: 30 pg (ref 26.0–34.0)
MCHC: 32.9 g/dL (ref 30.0–36.0)
MCV: 91 fL (ref 80.0–100.0)
Platelets: 269 10*3/uL (ref 150–400)
RBC: 2.67 MIL/uL — ABNORMAL LOW (ref 3.87–5.11)
RDW: 15.9 % — ABNORMAL HIGH (ref 11.5–15.5)
WBC: 8.5 10*3/uL (ref 4.0–10.5)
nRBC: 0 % (ref 0.0–0.2)

## 2024-12-14 MED ORDER — KETOROLAC TROMETHAMINE 30 MG/ML IJ SOLN
30.0000 mg | Freq: Once | INTRAMUSCULAR | Status: AC
  Administered 2024-12-14: 30 mg via INTRAVENOUS

## 2024-12-14 MED ORDER — HYDROMORPHONE HCL 1 MG/ML IJ SOLN: 0.2500 mg | INTRAMUSCULAR | Status: DC | PRN

## 2024-12-14 MED ORDER — CEFAZOLIN SODIUM-DEXTROSE 2-4 GM/100ML-% IV SOLN
INTRAVENOUS | Status: AC
  Filled 2024-12-14: qty 100

## 2024-12-14 MED ORDER — POVIDONE-IODINE 10 % EX SWAB: 2.0000 | Freq: Once | CUTANEOUS | Status: DC

## 2024-12-14 MED ORDER — METHYLERGONOVINE MALEATE 0.2 MG PO TABS
0.2000 mg | ORAL_TABLET | ORAL | Status: DC
  Administered 2024-12-14 – 2024-12-15 (×4): 0.2 mg via ORAL
  Filled 2024-12-14 (×4): qty 1

## 2024-12-14 MED ORDER — GLYCOPYRROLATE PF 0.2 MG/ML IJ SOSY
PREFILLED_SYRINGE | INTRAMUSCULAR | Status: DC | PRN
  Administered 2024-12-14: .2 mg via INTRAVENOUS

## 2024-12-14 MED ORDER — MIDAZOLAM HCL 5 MG/5ML IJ SOLN
INTRAMUSCULAR | Status: DC | PRN
  Administered 2024-12-14: 2 mg via INTRAVENOUS

## 2024-12-14 MED ORDER — LACTATED RINGERS IV SOLN: INTRAVENOUS | Status: DC

## 2024-12-14 MED ORDER — DEXMEDETOMIDINE HCL IN NACL 80 MCG/20ML IV SOLN
INTRAVENOUS | Status: DC | PRN
  Administered 2024-12-14: 6 ug via INTRAVENOUS

## 2024-12-14 MED ORDER — DEXAMETHASONE SOD PHOSPHATE PF 10 MG/ML IJ SOLN
INTRAMUSCULAR | Status: DC | PRN
  Administered 2024-12-14: 10 mg via INTRAVENOUS

## 2024-12-14 MED ORDER — SENNA 8.6 MG PO TABS: 1.0000 | ORAL_TABLET | Freq: Every evening | ORAL | Status: DC | PRN

## 2024-12-14 MED ORDER — FENTANYL CITRATE (PF) 100 MCG/2ML IJ SOLN
INTRAMUSCULAR | Status: DC | PRN
  Administered 2024-12-14 (×2): 50 ug via INTRAVENOUS

## 2024-12-14 MED ORDER — ZOLPIDEM TARTRATE 5 MG PO TABS: 5.0000 mg | ORAL_TABLET | Freq: Every evening | ORAL | Status: DC | PRN

## 2024-12-14 MED ORDER — IBUPROFEN 600 MG PO TABS
600.0000 mg | ORAL_TABLET | Freq: Four times a day (QID) | ORAL | Status: DC
  Administered 2024-12-14 – 2024-12-15 (×3): 600 mg via ORAL
  Filled 2024-12-14 (×3): qty 1

## 2024-12-14 MED ORDER — TRANEXAMIC ACID-NACL 1000-0.7 MG/100ML-% IV SOLN
INTRAVENOUS | Status: AC
  Filled 2024-12-14: qty 100

## 2024-12-14 MED ORDER — ORAL CARE MOUTH RINSE: 15.0000 mL | Freq: Once | OROMUCOSAL | Status: AC

## 2024-12-14 MED ORDER — PHENYLEPHRINE 80 MCG/ML (10ML) SYRINGE FOR IV PUSH (FOR BLOOD PRESSURE SUPPORT)
PREFILLED_SYRINGE | INTRAVENOUS | Status: DC | PRN
  Administered 2024-12-14 (×2): 160 ug via INTRAVENOUS

## 2024-12-14 MED ORDER — ONDANSETRON HCL 4 MG/2ML IJ SOLN: 4.0000 mg | Freq: Once | INTRAMUSCULAR | Status: DC | PRN

## 2024-12-14 MED ORDER — LIDOCAINE 2% (20 MG/ML) 5 ML SYRINGE
INTRAMUSCULAR | Status: DC | PRN
  Administered 2024-12-14: 80 mg via INTRAVENOUS

## 2024-12-14 MED ORDER — DEXTROSE IN LACTATED RINGERS 5 % IV SOLN: INTRAVENOUS | Status: DC

## 2024-12-14 MED ORDER — ONDANSETRON HCL 4 MG PO TABS: 4.0000 mg | ORAL_TABLET | Freq: Four times a day (QID) | ORAL | Status: DC | PRN

## 2024-12-14 MED ORDER — TRANEXAMIC ACID-NACL 1000-0.7 MG/100ML-% IV SOLN
1000.0000 mg | Freq: Once | INTRAVENOUS | Status: AC
  Administered 2024-12-14: 1000 mg via INTRAVENOUS

## 2024-12-14 MED ORDER — CHLORHEXIDINE GLUCONATE 0.12 % MT SOLN
OROMUCOSAL | Status: AC
  Administered 2024-12-14: 15 mL via OROMUCOSAL
  Filled 2024-12-14: qty 15

## 2024-12-14 MED ORDER — BUPIVACAINE HCL 0.5 % IJ SOLN
INTRAMUSCULAR | Status: DC | PRN
  Administered 2024-12-14: 20 mL

## 2024-12-14 MED ORDER — ALUM & MAG HYDROXIDE-SIMETH 200-200-20 MG/5ML PO SUSP: 30.0000 mL | ORAL | Status: DC | PRN

## 2024-12-14 MED ORDER — OXYCODONE-ACETAMINOPHEN 5-325 MG PO TABS: 1.0000 | ORAL_TABLET | ORAL | Status: DC | PRN

## 2024-12-14 MED ORDER — CEFAZOLIN SODIUM-DEXTROSE 2-4 GM/100ML-% IV SOLN
2.0000 g | INTRAVENOUS | Status: AC
  Administered 2024-12-14: 2 g via INTRAVENOUS

## 2024-12-14 MED ORDER — ONDANSETRON HCL 4 MG/2ML IJ SOLN
INTRAMUSCULAR | Status: DC | PRN
  Administered 2024-12-14: 4 mg via INTRAVENOUS

## 2024-12-14 MED ORDER — BISACODYL 10 MG RE SUPP: 10.0000 mg | Freq: Every day | RECTAL | Status: DC | PRN

## 2024-12-14 MED ORDER — MIDAZOLAM HCL 2 MG/2ML IJ SOLN
INTRAMUSCULAR | Status: AC
  Filled 2024-12-14: qty 2

## 2024-12-14 MED ORDER — OXYCODONE HCL 5 MG/5ML PO SOLN: 5.0000 mg | Freq: Once | ORAL | Status: DC | PRN

## 2024-12-14 MED ORDER — CHLORHEXIDINE GLUCONATE 0.12 % MT SOLN: 15.0000 mL | Freq: Once | OROMUCOSAL | Status: AC

## 2024-12-14 MED ORDER — METHYLERGONOVINE MALEATE 0.2 MG/ML IJ SOLN
INTRAMUSCULAR | Status: DC | PRN
  Administered 2024-12-14: .2 mg via INTRAMUSCULAR

## 2024-12-14 MED ORDER — KETOROLAC TROMETHAMINE 30 MG/ML IJ SOLN: 30.0000 mg | Freq: Once | INTRAMUSCULAR | Status: DC | PRN

## 2024-12-14 MED ORDER — ONDANSETRON HCL 4 MG/2ML IJ SOLN: 4.0000 mg | Freq: Four times a day (QID) | INTRAMUSCULAR | Status: DC | PRN

## 2024-12-14 MED ORDER — FENTANYL CITRATE (PF) 100 MCG/2ML IJ SOLN
INTRAMUSCULAR | Status: AC
  Filled 2024-12-14: qty 2

## 2024-12-14 MED ORDER — KETOROLAC TROMETHAMINE 30 MG/ML IJ SOLN
INTRAMUSCULAR | Status: AC
  Filled 2024-12-14: qty 1

## 2024-12-14 MED ORDER — PROPOFOL 10 MG/ML IV BOLUS
INTRAVENOUS | Status: DC | PRN
  Administered 2024-12-14: 150 mg via INTRAVENOUS

## 2024-12-14 MED ORDER — BUPIVACAINE HCL 0.5 % IJ SOLN
INTRAMUSCULAR | Status: AC
  Filled 2024-12-14: qty 1

## 2024-12-14 MED ORDER — OXYCODONE HCL 5 MG PO TABS: 5.0000 mg | ORAL_TABLET | Freq: Once | ORAL | Status: DC | PRN

## 2024-12-14 MED ORDER — METHYLERGONOVINE MALEATE 0.2 MG/ML IJ SOLN
INTRAMUSCULAR | Status: AC
  Filled 2024-12-14: qty 1

## 2024-12-14 NOTE — Progress Notes (Signed)
 Report received from MAU, nurse states they are able to bring to pt up to short stay.

## 2024-12-14 NOTE — Op Note (Signed)
 Preoperative diagnosis:  Incomplete AB after elective termination, pharmacologic  Postoperative diagnosis:  Same as above  Procedure:  Cervical dilation with suction and sharp uterine curettage                      Placement of paracervical block  Surgeon:  JAYNE VONN DEL  Anesthesia:  Laryngeal mask airway  Findings:  patient is s/p cytotec  for pregnancy termination and sonogram reveals definitive retained POC and with drop hemoglobin from 12 to 8 she needs D&C  Description of operation:  The patient was taken to the operating room and placed in the supine position.  She underwent laryngeal mask airway general anesthesia.  The patient was placed in the dorsal lithotomy position.  The vagina was prepped and draped in the usual sterile fashion.  A Graves speculum was placed.  The anterior cervix was grasped with a single-tooth tenaculum. 20 cc 0.5% marcaine  plain was placed as a paracervical block, 10 cc each side.  The cervix was dilated serially with Hegar dilators.  A #9 curved suction curette was placed in the uterus.  The suction pressure was placed at 55 and several passes were made.  All of the intrauterine contents were removed.  The sharp curette was used x1 to feel uterine crie in all areas.  The patient was given Methergine  0.2 mg IM x1.  There was good hemostasis.  The patient was given Ancef  2 grams and TXA 1 gram preoperatively.  The patient was given Toradol  30 mg IV 2 hours pre op. Estimated blood loss for the procedure was 50 cc.  The patient was awakened from anesthesia taken to the recovery room in good stable condition.  All counts were correct x3.  Vonn DEL Jayne, MD 12/14/2024 3:36 PM

## 2024-12-14 NOTE — Progress Notes (Signed)
 After speaking with patient it was found that she had no responsible party to stay with her for 24 hours after anesthesia today.  She mentioned taking an uber.  I told her that is not an option to uber without a responsible party with her.    Dr. Jayne is aware and the women's [facility] as well.

## 2024-12-14 NOTE — Progress Notes (Signed)
 Pt has not arrived to Short Stay, called MAU and pt has not left yet as they do not have a tech to transport. OR OCT paged at this time.

## 2024-12-14 NOTE — Plan of Care (Signed)
" °  Problem: Education: Goal: Knowledge of the prescribed therapeutic regimen will improve Outcome: Progressing   Problem: Bowel/Gastric: Goal: Gastrointestinal status for postoperative course will improve Outcome: Progressing   Problem: Cardiac: Goal: Ability to maintain an adequate cardiac output Outcome: Progressing Goal: Will show no evidence of cardiac arrhythmias Outcome: Progressing   Problem: Nutritional: Goal: Will attain and maintain optimal nutritional status Outcome: Progressing   Problem: Neurological: Goal: Will regain or maintain usual level of consciousness Outcome: Progressing   Problem: Clinical Measurements: Goal: Ability to maintain clinical measurements within normal limits Outcome: Progressing Goal: Postoperative complications will be avoided or minimized Outcome: Progressing   Problem: Respiratory: Goal: Will regain and/or maintain adequate ventilation Outcome: Progressing Goal: Respiratory status will improve Outcome: Progressing   Problem: Skin Integrity: Goal: Demonstrates signs of wound healing without infection Outcome: Progressing   Problem: Urinary Elimination: Goal: Will remain free from infection Outcome: Progressing Goal: Ability to achieve and maintain adequate urine output Outcome: Progressing   Problem: Education: Goal: Knowledge of General Education information will improve Description: Including pain rating scale, medication(s)/side effects and non-pharmacologic comfort measures Outcome: Progressing   Problem: Health Behavior/Discharge Planning: Goal: Ability to manage health-related needs will improve Outcome: Progressing   Problem: Clinical Measurements: Goal: Ability to maintain clinical measurements within normal limits will improve Outcome: Progressing Goal: Will remain free from infection Outcome: Progressing Goal: Diagnostic test results will improve Outcome: Progressing Goal: Respiratory complications will  improve Outcome: Progressing Goal: Cardiovascular complication will be avoided Outcome: Progressing   Problem: Activity: Goal: Risk for activity intolerance will decrease Outcome: Progressing   Problem: Nutrition: Goal: Adequate nutrition will be maintained Outcome: Progressing   Problem: Coping: Goal: Level of anxiety will decrease Outcome: Progressing   Problem: Elimination: Goal: Will not experience complications related to bowel motility Outcome: Progressing Goal: Will not experience complications related to urinary retention Outcome: Progressing   Problem: Pain Managment: Goal: General experience of comfort will improve and/or be controlled Outcome: Progressing   Problem: Safety: Goal: Ability to remain free from injury will improve Outcome: Progressing   Problem: Skin Integrity: Goal: Risk for impaired skin integrity will decrease Outcome: Progressing   Problem: Education: Goal: Knowledge of the prescribed therapeutic regimen will improve Outcome: Progressing Goal: Understanding of sexual limitations or changes related to disease process or condition will improve Outcome: Progressing Goal: Individualized Educational Video(s) Outcome: Progressing   Problem: Self-Concept: Goal: Communication of feelings regarding changes in body function or appearance will improve Outcome: Progressing   Problem: Skin Integrity: Goal: Demonstration of wound healing without infection will improve Outcome: Progressing   "

## 2024-12-14 NOTE — Transfer of Care (Signed)
 Immediate Anesthesia Transfer of Care Note  Patient: Carrie Nielsen  Procedure(s) Performed: DILATION AND EVACUATION, UTERUS (Uterus)  Patient Location: PACU  Anesthesia Type:General  Level of Consciousness: drowsy and patient cooperative  Airway & Oxygen Therapy: Patient Spontanous Breathing and Patient connected to face mask oxygen  Post-op Assessment: Report given to RN and Post -op Vital signs reviewed and stable  Post vital signs: Reviewed and stable  Last Vitals:  Vitals Value Taken Time  BP 115/79 12/14/24 15:01  Temp    Pulse 74 12/14/24 15:03  Resp 15 12/14/24 15:03  SpO2 100 % 12/14/24 15:03  Vitals shown include unfiled device data.  Last Pain:  Vitals:   12/14/24 1318  TempSrc:   PainSc: 0-No pain         Complications: No notable events documented.

## 2024-12-14 NOTE — Anesthesia Preprocedure Evaluation (Signed)
 "                                  Anesthesia Evaluation  Patient identified by MRN, date of birth, ID band Patient awake    Reviewed: Allergy & Precautions, NPO status , Patient's Chart, lab work & pertinent test results  History of Anesthesia Complications Negative for: history of anesthetic complications  Airway Mallampati: II  TM Distance: >3 FB Neck ROM: Full    Dental no notable dental hx. (+) Teeth Intact, Dental Advisory Given   Pulmonary neg pulmonary ROS   Pulmonary exam normal breath sounds clear to auscultation       Cardiovascular hypertension, (-) angina (-) Past MI Normal cardiovascular exam Rhythm:Regular Rate:Normal     Neuro/Psych  negative psych ROS   GI/Hepatic ,GERD  ,,  Endo/Other    Renal/GU      Musculoskeletal   Abdominal   Peds  Hematology  (+) Blood dyscrasia, anemia Lab Results      Component                Value               Date                      WBC                      8.5                 12/14/2024                HGB                      8.0 (L)             12/14/2024                HCT                      24.3 (L)            12/14/2024                MCV                      91.0                12/14/2024                PLT                      269                 12/14/2024              Anesthesia Other Findings   Reproductive/Obstetrics (+) Pregnancy                              Anesthesia Physical Anesthesia Plan  ASA: 2  Anesthesia Plan: General   Post-op Pain Management: Precedex , Tylenol  PO (pre-op)* and Toradol  IV (intra-op)*   Induction: Intravenous  PONV Risk Score and Plan: 3 and Treatment may vary due to age or medical condition, Midazolam , Dexamethasone  and Ondansetron   Airway Management Planned: LMA  Additional Equipment: None  Intra-op Plan:  Post-operative Plan: Extubation in OR  Informed Consent:      Dental advisory given  Plan Discussed  with:   Anesthesia Plan Comments:         Anesthesia Quick Evaluation  "

## 2024-12-14 NOTE — MAU Provider Note (Signed)
 MAU Provider Note   Whitnie Deleon is a 34 y.o. [id number] female at [redacted]w[redacted]d who presents for vaginal bleeding. Took cytotec  on January 30th and then again on Feb 1st. Since that time has had extensive vaginal bleeding soaking a pad in about 45 minutes. It has not lightened up. She states she was approximately 9 weeks at the time.  Physical Exam Constitutional:      Appearance: She is well-developed.  Cardiovascular:     Rate and Rhythm: Normal rate and regular rhythm.  Pulmonary:     Effort: Pulmonary effort is normal.     Breath sounds: Normal breath sounds.  Abdominal:     General: Bowel sounds are normal.     Palpations: Abdomen is soft.     Tenderness: There is no abdominal tenderness.  Neurological:     Mental Status: She is alert.      Plan:   Patient to be admitted for [facility]. See H&P for further details.

## 2024-12-14 NOTE — H&P (Signed)
 SABRA   Preoperative History and Physical  Carrie Nielsen is a 34 y.o. [id number] here for surgical management of retained products of conception.   No significant preoperative concerns.  Proposed surgery: D&C  Past Medical History:  Diagnosis Date   Bartholin cyst 06/19/2021   History of cold sores 06/19/2021   Pregnancy induced hypertension    Vaginal Pap smear, abnormal 11/08/2020   Past Surgical History:  Procedure Laterality Date   BARTHOLIN GLAND CYST EXCISION N/A 06/24/2021   Procedure: BARTHOLIN GLAND EXCISION;  Surgeon: Lorence Ozell CROME, MD;  Location: [facility];  Service: Gynecology;  Laterality: N/A;   IUD INSERTION  07/05/2022   WISDOM TOOTH EXTRACTION     yrs ago   OB History  Gravida Para Term Preterm AB Living  8 4 4  3 4   SAB IAB Ectopic Multiple Live Births  1 2  0 4    # Outcome Date GA Lbr Len/2nd Weight Sex Type Anes PTL Lv  8 Current           7 Term 07/05/22 [redacted]w[redacted]d 07:07 / 00:20 3790 g M Vag-Spont EPI  LIV     Birth Comments: WNL  6 IAB 03/2021          5 Term 12/04/20 [redacted]w[redacted]d 06:29 / 00:06 2991 g M Vag-Spont EPI  LIV  4 Term 02/11/16 [redacted]w[redacted]d 07:15 / 00:21 3835 g M Vag-Vacuum EPI  LIV  3 Term 05/31/13 [redacted]w[redacted]d 17:30 / 02:37 3210 g M Vag-Spont EPI  LIV  2 IAB           1 SAB           Patient denies any other pertinent gynecologic issues.   Medications Ordered Prior to Encounter[1] Allergies[2]  Social History:   reports that she has never smoked. She has never used smokeless tobacco. She reports that she does not currently use alcohol. She reports that she does not currently use drugs.  Family History  Problem Relation Age of Onset   Cancer Maternal Grandfather        brain   Alcohol abuse Neg Hx    Arthritis Neg Hx    Asthma Neg Hx    Birth defects Neg Hx    COPD Neg Hx    Depression Neg Hx    Diabetes Neg Hx    Early death Neg Hx    Drug abuse Neg Hx    Hearing loss Neg Hx    Heart disease Neg Hx    Hyperlipidemia Neg Hx     Hypertension Neg Hx    Kidney disease Neg Hx    Learning disabilities Neg Hx    Mental illness Neg Hx    Mental retardation Neg Hx    Miscarriages / Stillbirths Neg Hx    Stroke Neg Hx    Vision loss Neg Hx     Review of Systems: Pertinent items noted in HPI and remainder of comprehensive ROS otherwise negative.  PHYSICAL EXAM: BP 104/65   Pulse 87   Temp 98.6 F (37 C)   Resp 18   Ht 5' 10 (1.778 m)   Wt 73.9 kg   LMP 09/22/2024 (Exact Date)   BMI 23.39 kg/m   Physical Exam  Labs: Results for orders placed or performed during the hospital encounter of 12/14/24 (from the past 2 weeks)  CBC   Collection Time: 12/14/24 10:18 AM  Result Value Ref Range   WBC 8.5 4.0 - 10.5 K/uL  RBC 2.67 (L) 3.87 - 5.11 MIL/uL   Hemoglobin 8.0 (L) 12.0 - 15.0 g/dL   HCT 75.6 (L) 63.9 - 53.9 %   MCV 91.0 80.0 - 100.0 fL   MCH 30.0 26.0 - 34.0 pg   MCHC 32.9 30.0 - 36.0 g/dL   RDW 84.0 (H) 88.4 - 84.4 %   Platelets 269 150 - 400 K/uL   nRBC 0.0 0.0 - 0.2 %    Imaging Studies: US  OB LESS THAN 14 WEEKS WITH OB TRANSVAGINAL Result Date: 12/14/2024 EXAM: ULTRASOUND FIRST TRIMESTER TECHNIQUE: Transabdominal and Transvaginal first trimester obstetric pelvic duplex ultrasound was performed with real-time imaging, color flow Doppler imaging, and spectral analysis. COMPARISON: None available. CLINICAL HISTORY: Vaginal bleeding. FINDINGS: UTERUS: The endometrium is heterogeneously thickened measuring 3.2 cm thick. No focal myometrial mass. GESTATIONAL SAC(S): No intrauterine gestational sac. No subchorionic hemorrhage. YOLK SAC: No yolk sac. EMBRYO(<11WK) /FETUS(>=11WK): No fetal pole. CROWN RUMP LENGTH: Not measured RATE OF CARDIAC ACTIVITY: Not measured. RIGHT OVARY: Within normal limits containing physiologic follicles. LEFT OVARY: Within normal limits containing physiologic follicles. FREE FLUID: No free fluid. MEASUREMENTS ESTIMATED GESTATIONAL AGE BY CURRENT ULTRASOUND: Not applicable (no  intrauterine gestational sac). ESTIMATED GESTATIONAL AGE BY LMP/PRIOR ULTRASOUND: Not provided. ESTIMATED DUE DATE: Not applicable. IMPRESSION: 1. No intrauterine pregnancy. 2. Heterogeneously thickened endometrium measuring 3.2 cm, without increased vascular Doppler flow. While this could represent changes related to recent miscarriage, avascular retained products of conception remains in the differential. Close clinical and laboratory follow-up recommended. Electronically signed by: Rogelia Myers MD 12/14/2024 11:19 AM EST RP Workstation: [id number]    Assessment: Retained products of conception following abortion  [redacted] weeks gestation of pregnancy   Plan: Patient will undergo surgical management with D&C.   The risks of surgery were discussed in detail with the patient including but not limited to: bleeding which may require transfusion or reoperation; infection which may require antibiotics; injury to surrounding organs which may involve bowel, bladder, ureters; need for additional procedures including laparoscopy/laparotomy or subsequent procedures secondary to abnormal pathology; thromboembolic phenomenon, surgical site problems and other postoperative/anesthesia complications. Likelihood of success in alleviating the patient's condition was discussed. Routine postoperative instructions will be reviewed with the patient and her family in detail after surgery.  The patient concurred with the proposed plan, giving informed written consent for the surgery.   Barkley Angles, MD OB Fellow, Faculty Practice [facility], Center for [facility]      [1]  No current facility-administered medications on file prior to encounter.   Current Outpatient Medications on File Prior to Encounter  Medication Sig Dispense Refill   Ferric Maltol  (ACCRUFER ) 30 MG CAPS Take 1 capsule (30 mg total) by mouth 2 (two) times daily before a meal. Take 2 hrs before, or 2 hrs after a meal. (Patient not taking:  Reported on 11/27/2024) 60 capsule 3   metroNIDAZOLE  (FLAGYL ) 500 MG tablet Take 1 tablet (500 mg total) by mouth 2 (two) times daily. 14 tablet 2   valACYclovir  (VALTREX ) 500 MG tablet Take 1 tablet (500 mg total) by mouth 2 (two) times daily. (Patient not taking: Reported on 11/27/2024) 6 tablet 1  [2] No Known Allergies

## 2024-12-14 NOTE — Anesthesia Procedure Notes (Signed)
 Procedure Name: LMA Insertion Date/Time: 12/14/2024 2:22 PM  Performed by: Denton Niels CROME, CRNAPre-anesthesia Checklist: Patient identified, Emergency Drugs available, Suction available, Patient being monitored and Timeout performed Patient Re-evaluated:Patient Re-evaluated prior to induction Oxygen Delivery Method: Circle system utilized Preoxygenation: Pre-oxygenation with 100% oxygen Induction Type: IV induction Ventilation: Mask ventilation without difficulty LMA: LMA inserted LMA Size: 4.0 Number of attempts: 1 Placement Confirmation: positive ETCO2 Dental Injury: Teeth and Oropharynx as per pre-operative assessment

## 2024-12-14 NOTE — MAU Note (Signed)
 Carrie Nielsen is a 34 y.o. at Unknown here in MAU reporting: took abortion pill on Friday and again on Sunday. Pt feeling bleeding s too heavy. Changing pad q 1 hour. Cramping 10/10 when it happens.   LMP: 09/22/2024 Onset of complaint: Sunday Pain score: 10 Vitals:   12/14/24 0949  BP: 104/65  Pulse: 87  Resp: 18  Temp: 98.6 F (37 C)     FHT: n/a   Lab orders placed from triage:

## 2024-12-15 ENCOUNTER — Encounter (HOSPITAL_COMMUNITY): Payer: Self-pay | Admitting: Obstetrics & Gynecology

## 2024-12-15 LAB — CBC
HCT: 23.8 % — ABNORMAL LOW (ref 36.0–46.0)
Hemoglobin: 7.9 g/dL — ABNORMAL LOW (ref 12.0–15.0)
MCH: 29.9 pg (ref 26.0–34.0)
MCHC: 33.2 g/dL (ref 30.0–36.0)
MCV: 90.2 fL (ref 80.0–100.0)
Platelets: 286 10*3/uL (ref 150–400)
RBC: 2.64 MIL/uL — ABNORMAL LOW (ref 3.87–5.11)
RDW: 15.4 % (ref 11.5–15.5)
WBC: 9.5 10*3/uL (ref 4.0–10.5)
nRBC: 0 % (ref 0.0–0.2)

## 2024-12-15 MED ORDER — POLYETHYLENE GLYCOL 3350 17 G PO PACK: 17.0000 g | PACK | Freq: Every day | ORAL | 1 refills | Status: AC | PRN

## 2024-12-15 MED ORDER — ACCRUFER 30 MG PO CAPS: 1.0000 | ORAL_CAPSULE | ORAL | 0 refills | Status: AC

## 2024-12-15 MED ORDER — IBUPROFEN 600 MG PO TABS: 600.0000 mg | ORAL_TABLET | Freq: Four times a day (QID) | ORAL | 0 refills | Status: AC | PRN
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
## 2024-12-15 NOTE — Progress Notes (Signed)
 RN spoke with Dr Jayne regarding Valrex and Flagyl  on dc meds but not at the pharmacy. Dr Jayne said that she does not need the Flagyl  unless she is febrile. RN called the pt and let her know.

## 2024-12-15 NOTE — Progress Notes (Signed)
 Pt walked without RN going over dc. RN called to go over discharge paperwork and noticed not all medications were ordered at the pharmacy. RN contacted [facility]st call [facility] and provider stated she would add those. Pt aware of this and agreed to wait a little bit before picking up medication. RN provided patient with RN's work phone number to call back if she needed anything else or had any questions.

## 2024-12-15 NOTE — Discharge Instructions (Signed)
   We will discuss your surgery once again in detail at your post-op visit in two to four weeks. If you haven't already done so, please call to make your appointment as soon as possible.  Dilation and Curettage or Vacuum Curettage, Care After These instructions give you information on caring for yourself after your procedure. Your doctor may also give you more specific instructions. Call your doctor if you have any problems or questions after your procedure. HOME CARE Do not drive for 24 hours. Wait 1 week before doing any activities that wear you out. Do not stand for a long time. Limit stair climbing to once or twice a day. Rest often. Continue with your usual diet. Drink enough fluids to keep your pee (urine) clear or pale yellow. If you have a hard time pooping (constipation), you may: Take a medicine to help you go poop (laxative) as told by your doctor. Eat more fruit and bran. Drink more fluids. Take showers, not baths, for as long as told by your doctor. Do not swim or use a hot tub until your doctor says it is okay. Have someone with you for 1day after the procedure. Do not douche, use tampons, or have sex (intercourse) until seen by your doctor Only take medicines as told by your doctor. Do not take aspirin. It can cause bleeding. Keep all doctor visits. GET HELP IF: You have cramps or pain not helped by medicine. You have new pain in the belly (abdomen). You have a bad smelling fluid coming from your vagina. You have a rash. You have problems with any medicine. GET HELP RIGHT AWAY IF:  You start to bleed more than a regular period. You have a fever. You have chest pain. You have trouble breathing. You feel dizzy or feel like passing out (fainting). You pass out. You have pain in the tops of your shoulders. You have vaginal bleeding with or without clumps of blood (blood clots). MAKE SURE YOU: Understand these instructions. Will watch your condition. Will get help  right away if you are not doing well or get worse. Document Released: 08/04/2008 Document Revised: 10/31/2013 Document Reviewed: 05/25/2013 ExitCare Patient Information 2015 [employer]. This information is not intended to replace advice given to you by your health care provider. Make sure you discuss any questions you have with your health care provider.   

## 2024-12-15 NOTE — Discharge Summary (Signed)
 Gynecology Discharge Summary Date of Admission: 12/14/2024 Date of Discharge: 12/15/2024  The patient was admitted from the Maternity Admissions Unit for surgical management of VB after an elective AB via medications. She underwent an uncomplicated suction d&c on 2/5, but was kept due to lack of help at home.  She was meeting all post op goals and discharged to home on POD#1. Pt told about need to avoid work, heavy machinery etc until 24h s/p anesthesia.      Latest Ref Rng & Units 12/15/2024    4:19 AM 12/14/2024   10:18 AM 06/02/2024    9:02 AM  CBC  WBC 4.0 - 10.5 K/uL 9.5  8.5  5.0   Hemoglobin 12.0 - 15.0 g/dL 7.9  8.0  88.2   Hematocrit 36.0 - 46.0 % 23.8  24.3  37.7   Platelets 150 - 400 K/uL 286  269  317    Allergies as of 12/15/2024   No Known Allergies      Medication List     TAKE these medications    ACCRUFeR  30 MG Caps Generic drug: Ferric Maltol  Take 1 capsule (30 mg total) by mouth every other day for 30 doses. Take 2 hrs before, or 2 hrs after a meal. What changed: when to take this   ibuprofen  600 MG tablet Commonly known as: ADVIL  Take 1 tablet (600 mg total) by mouth every 6 (six) hours as needed.   metroNIDAZOLE  500 MG tablet Commonly known as: FLAGYL  Take 1 tablet (500 mg total) by mouth 2 (two) times daily.   polyethylene glycol 17 g packet Commonly known as: MiraLax  Take 17 g by mouth daily as needed for moderate constipation or severe constipation.   valACYclovir  500 MG tablet Commonly known as: Valtrex  Take 1 tablet (500 mg total) by mouth 2 (two) times daily.        No future appointments.  Bebe Izell Overcast MD Attending Center for [facility])

## 2024-12-15 NOTE — Anesthesia Postprocedure Evaluation (Signed)
"   Anesthesia Post Note  Patient: Carrie Nielsen  Procedure(s) Performed: DILATION AND EVACUATION, UTERUS (Uterus)     Patient location during evaluation: PACU Anesthesia Type: General Level of consciousness: awake and alert Pain management: pain level controlled Vital Signs Assessment: post-procedure vital signs reviewed and stable Respiratory status: spontaneous breathing, nonlabored ventilation, respiratory function stable and patient connected to nasal cannula oxygen Cardiovascular status: blood pressure returned to baseline and stable Postop Assessment: no apparent nausea or vomiting Anesthetic complications: no   No notable events documented.  Last Vitals:  Vitals:   12/15/24 0421 12/15/24 0755  BP: 112/61 121/78  Pulse: 82 72  Resp: 17 16  Temp: 36.8 C 36.8 C  SpO2: 99% 100%    Last Pain:  Vitals:   12/15/24 0800  TempSrc:   PainSc: 0-No pain   Pain Goal:                   Dwan Hemmelgarn L Rether Rison      "
